# Patient Record
Sex: Male | Born: 1955 | Race: White | Hispanic: No | Marital: Married | Smoking: Never smoker
Health system: Southern US, Community
[De-identification: ages and names within clinical notes are randomized; demographics above are authoritative.]

## PROBLEM LIST (undated history)

## (undated) DIAGNOSIS — I2510 Atherosclerotic heart disease of native coronary artery without angina pectoris: Secondary | ICD-10-CM

## (undated) DIAGNOSIS — E785 Hyperlipidemia, unspecified: Secondary | ICD-10-CM

## (undated) DIAGNOSIS — K219 Gastro-esophageal reflux disease without esophagitis: Secondary | ICD-10-CM

## (undated) DIAGNOSIS — M1990 Unspecified osteoarthritis, unspecified site: Secondary | ICD-10-CM

## (undated) DIAGNOSIS — R0989 Other specified symptoms and signs involving the circulatory and respiratory systems: Secondary | ICD-10-CM

## (undated) DIAGNOSIS — I779 Disorder of arteries and arterioles, unspecified: Secondary | ICD-10-CM

## (undated) DIAGNOSIS — F17220 Nicotine dependence, chewing tobacco, uncomplicated: Secondary | ICD-10-CM

## (undated) DIAGNOSIS — I209 Angina pectoris, unspecified: Secondary | ICD-10-CM

## (undated) DIAGNOSIS — Z87442 Personal history of urinary calculi: Secondary | ICD-10-CM

## (undated) DIAGNOSIS — I10 Essential (primary) hypertension: Secondary | ICD-10-CM

## (undated) DIAGNOSIS — E119 Type 2 diabetes mellitus without complications: Secondary | ICD-10-CM

## (undated) DIAGNOSIS — R519 Headache, unspecified: Secondary | ICD-10-CM

## (undated) HISTORY — DX: Nicotine dependence, chewing tobacco, uncomplicated: F17.220

## (undated) HISTORY — DX: Type 2 diabetes mellitus without complications: E11.9

## (undated) HISTORY — DX: Essential (primary) hypertension: I10

## (undated) HISTORY — DX: Disorder of arteries and arterioles, unspecified: I77.9

## (undated) HISTORY — PX: COLONOSCOPY: SHX174

---

## 2005-12-24 ENCOUNTER — Ambulatory Visit: Payer: Self-pay | Admitting: Family Medicine

## 2006-02-05 ENCOUNTER — Ambulatory Visit (HOSPITAL_COMMUNITY): Admission: RE | Admit: 2006-02-05 | Discharge: 2006-02-05 | Payer: Self-pay | Admitting: Neurosurgery

## 2006-07-09 HISTORY — PX: BACK SURGERY: SHX140

## 2008-03-30 ENCOUNTER — Ambulatory Visit: Payer: Self-pay | Admitting: Gastroenterology

## 2008-03-30 LAB — HM COLONOSCOPY

## 2008-09-10 ENCOUNTER — Ambulatory Visit: Payer: Self-pay | Admitting: Family Medicine

## 2010-02-07 ENCOUNTER — Ambulatory Visit: Payer: Self-pay | Admitting: Family Medicine

## 2010-07-29 ENCOUNTER — Encounter: Payer: Self-pay | Admitting: Neurosurgery

## 2010-09-01 ENCOUNTER — Inpatient Hospital Stay: Payer: Self-pay | Admitting: Internal Medicine

## 2010-09-04 ENCOUNTER — Ambulatory Visit: Payer: Self-pay

## 2011-06-25 ENCOUNTER — Ambulatory Visit: Payer: Self-pay | Admitting: Family Medicine

## 2012-02-22 IMAGING — CR DG KNEE COMPLETE 4+V*R*
1 series · 4 of 4 positions shown · non-contrast
Comparison: none

REASON FOR EXAM: pain
COMMENTS:

[Series 1: ap · 0.17mm/px · 4 of 4 slices shown]
[im 1/4]
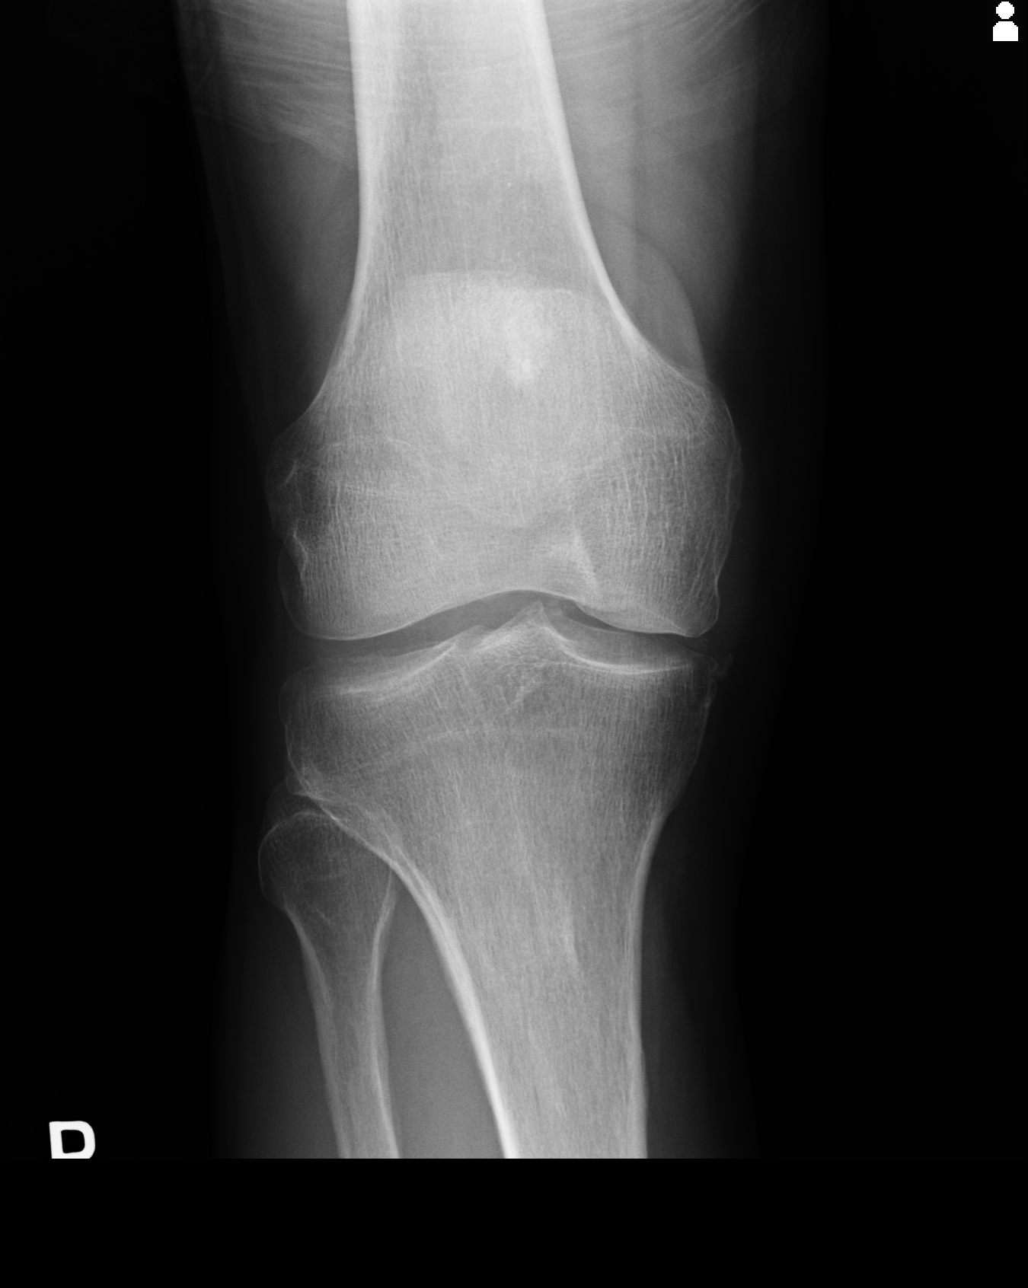
[im 2/4]
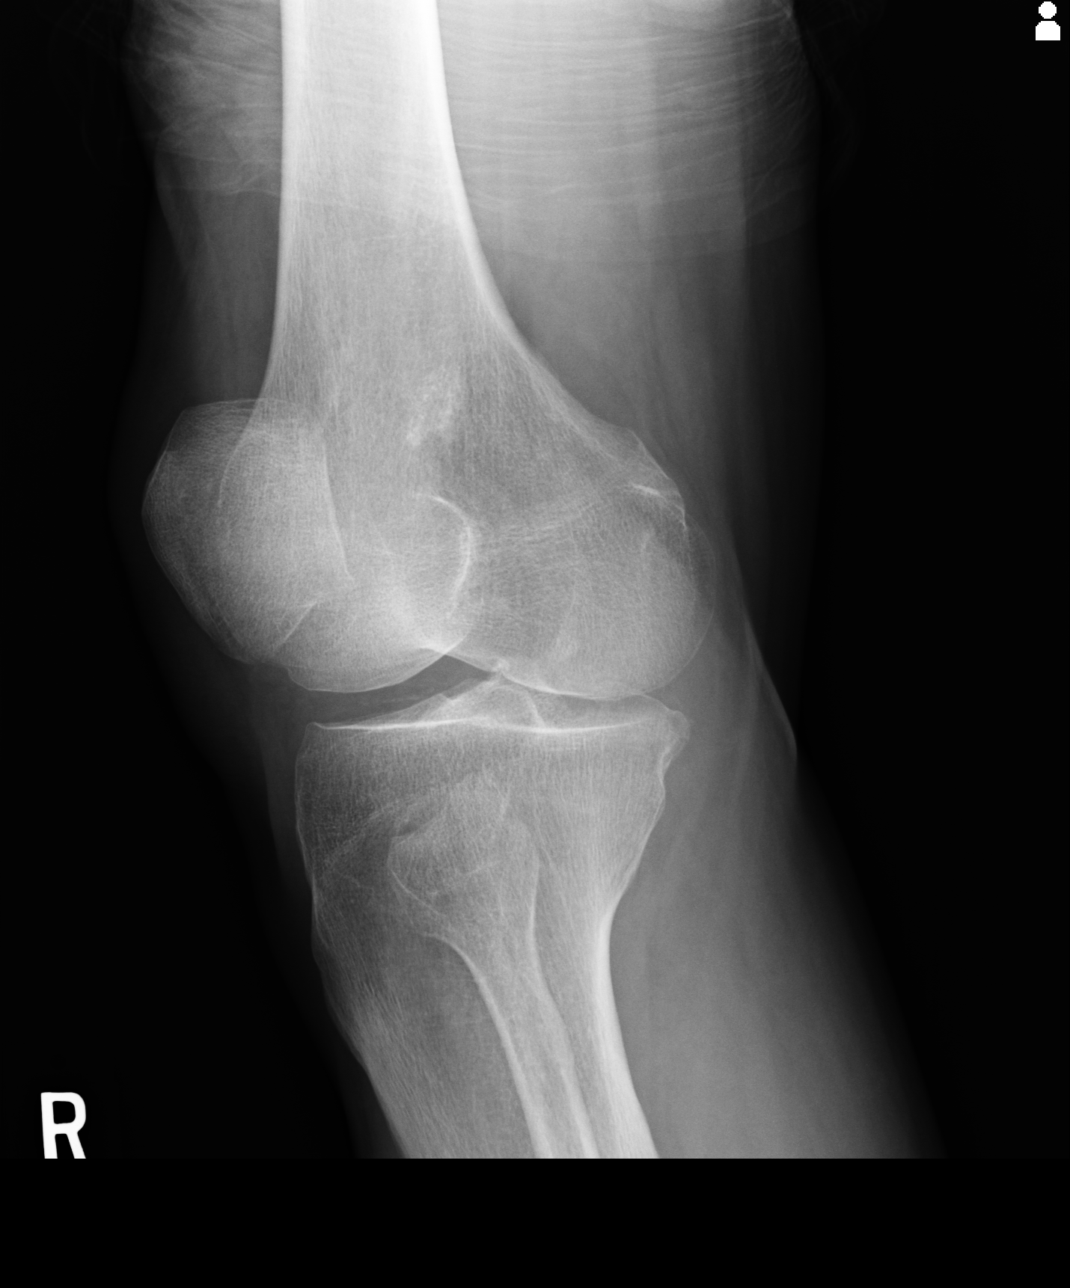
[im 3/4]
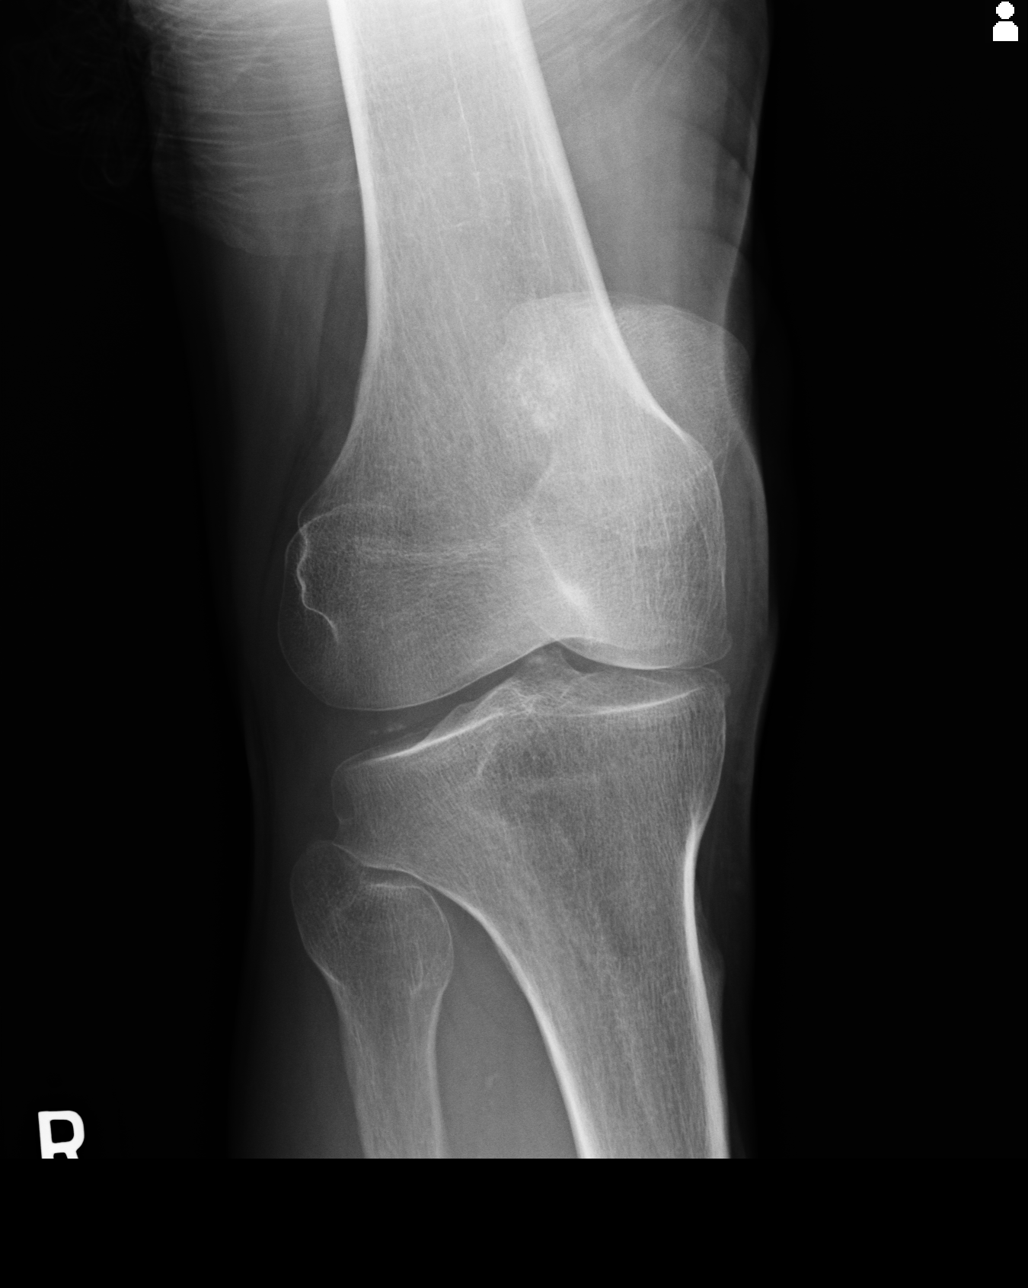
[im 4/4]
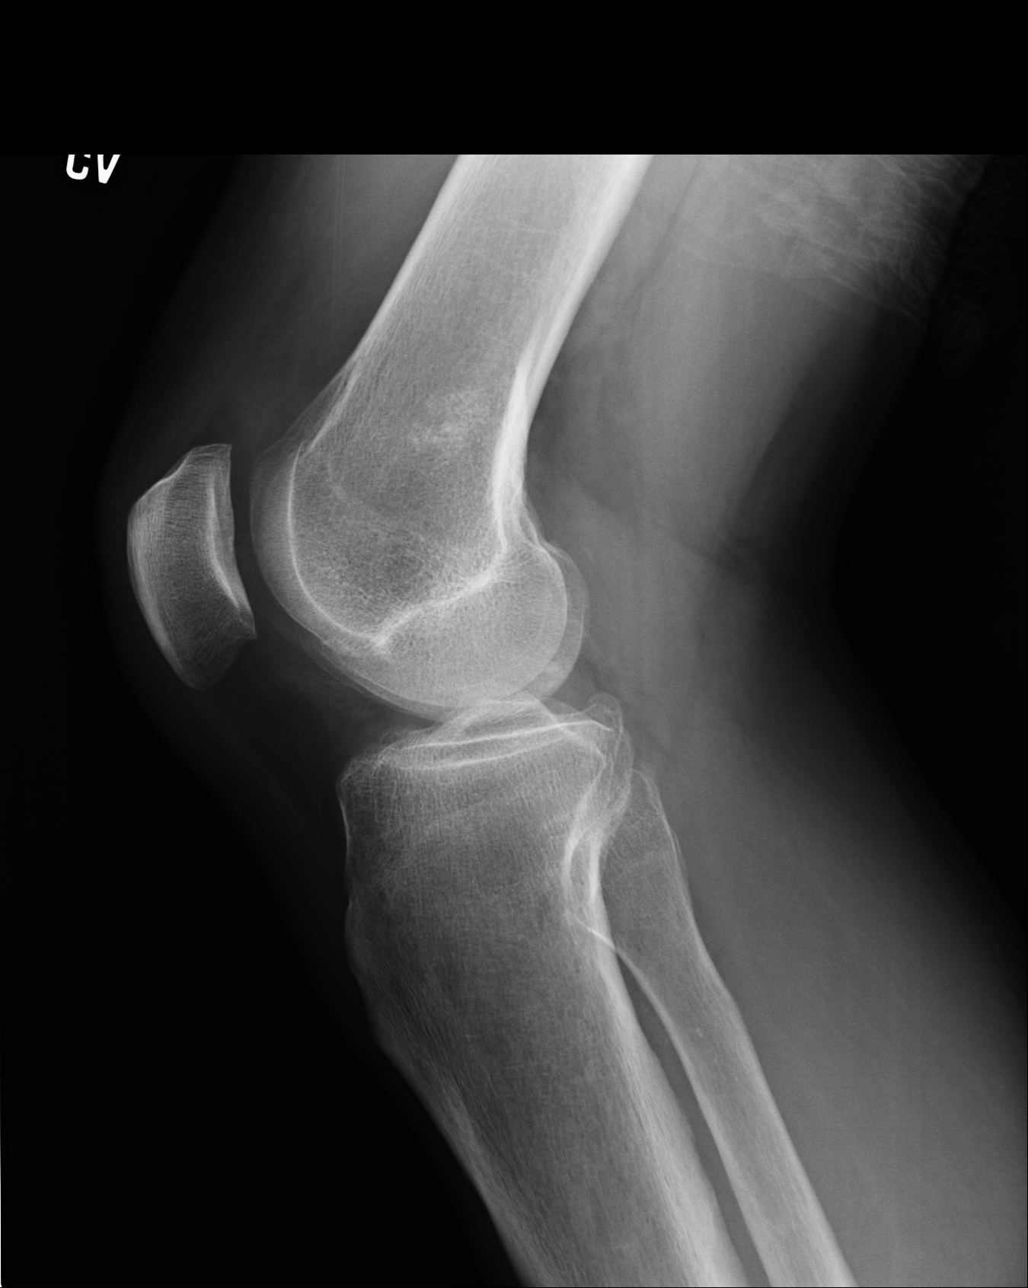

[4 of 4 positions shown; findings below may reference images not displayed]

PROCEDURE:     KDR - KDXR KNEE RT COMP WITH OBLIQUES  - June 25, 2011 [DATE]

RESULT:

No fracture or dislocation is seen. There is calcification in the knee joint
space of the type that can be seen with pseudogout, gout or osteoarthritis.
There is slight spur formation at the knee joint medially. The knee joint
space is well maintained. No lytic or blastic lesions about the knee are
seen. The patella is intact.
IMPRESSION: 1.  No fracture is seen.
2.  There is slight spur formation at the knee medially.
3.  There is observed calcification in the knee joint space of the type seen
with pseudogout, gout or osteoarthritis.

## 2012-07-21 ENCOUNTER — Ambulatory Visit: Payer: Self-pay | Admitting: Orthopedic Surgery

## 2012-10-17 ENCOUNTER — Other Ambulatory Visit: Payer: Self-pay | Admitting: Neurosurgery

## 2012-10-17 DIAGNOSIS — M545 Low back pain, unspecified: Secondary | ICD-10-CM

## 2012-10-24 ENCOUNTER — Ambulatory Visit
Admission: RE | Admit: 2012-10-24 | Discharge: 2012-10-24 | Disposition: A | Discharge status: Home / Self Care | Payer: Self-pay | Source: Ambulatory Visit | Attending: Neurosurgery | Admitting: Neurosurgery

## 2012-10-24 VITALS — BP 147/97 | HR 71 | Ht 70.0 in | Wt 180.0 lb

## 2012-10-24 DIAGNOSIS — M545 Low back pain, unspecified: Secondary | ICD-10-CM

## 2012-10-24 MED ORDER — IOHEXOL 180 MG/ML  SOLN
15.0000 mL | Freq: Once | INTRAMUSCULAR | Status: AC | PRN
  Administered 2012-10-24: 15 mL via INTRATHECAL

## 2012-10-24 MED ORDER — DIAZEPAM 5 MG PO TABS
10.0000 mg | ORAL_TABLET | Freq: Once | ORAL | Status: AC
  Administered 2012-10-24: 10 mg via ORAL

## 2012-12-10 ENCOUNTER — Other Ambulatory Visit: Payer: Self-pay | Admitting: Neurosurgery

## 2012-12-19 ENCOUNTER — Encounter (HOSPITAL_COMMUNITY): Payer: Self-pay | Admitting: Pharmacy Technician

## 2012-12-26 ENCOUNTER — Encounter (HOSPITAL_COMMUNITY)
Admission: RE | Admit: 2012-12-26 | Discharge: 2012-12-26 | Disposition: A | Discharge status: Home / Self Care | Payer: Worker's Compensation | Source: Ambulatory Visit | Attending: Neurosurgery | Admitting: Neurosurgery

## 2012-12-26 ENCOUNTER — Encounter (HOSPITAL_COMMUNITY)
Admission: RE | Admit: 2012-12-26 | Discharge: 2012-12-26 | Disposition: A | Discharge status: Home / Self Care | Payer: Worker's Compensation | Source: Ambulatory Visit | Attending: Anesthesiology | Admitting: Anesthesiology

## 2012-12-26 ENCOUNTER — Encounter (HOSPITAL_COMMUNITY): Payer: Self-pay

## 2012-12-26 HISTORY — DX: Unspecified osteoarthritis, unspecified site: M19.90

## 2012-12-26 HISTORY — DX: Personal history of urinary calculi: Z87.442

## 2012-12-26 HISTORY — DX: Gastro-esophageal reflux disease without esophagitis: K21.9

## 2012-12-26 LAB — CBC
HCT: 45.6 % (ref 39.0–52.0)
Hemoglobin: 16.1 g/dL (ref 13.0–17.0)
MCH: 28.8 pg (ref 26.0–34.0)
MCV: 81.4 fL (ref 78.0–100.0)
RBC: 5.6 MIL/uL (ref 4.22–5.81)

## 2012-12-26 LAB — TYPE AND SCREEN: Antibody Screen: NEGATIVE

## 2012-12-26 NOTE — Pre-Procedure Instructions (Addendum)
SMAYAN HACKBART  12/26/2012   Your procedure is scheduled on:  01/01/13  Report to [facility] at 530 AM.  Call this number if you have problems the morning of surgery: [phone]   Remember:   Do not eat food or drink liquids after midnight.   Take these medicines the morning of surgery with A SIP OF WATER: nexium                   STOP aspirin, naproxen or any other nsaids   Do not wear jewelry, make-up or nail polish.  Do not wear lotions, powders, or perfumes. You may wear deodorant.  Do not shave 48 hours prior to surgery. Men may shave face and neck.  Do not bring valuables to the hospital.  [facility] is not responsible                   for any belongings or valuables.  Contacts, dentures or bridgework may not be worn into surgery.  Leave suitcase in the car. After surgery it may be brought to your room.  For patients admitted to the hospital, checkout time is 11:00 AM the day of  discharge.   Patients discharged the day of surgery will not be allowed to drive  home.  Name and phone number of your driver:  Special Instructions: Shower using CHG 2 nights before surgery and the night before surgery.  If you shower the day of surgery use CHG.  Use special wash - you have one bottle of CHG for all showers.  You should use approximately 1/3 of the bottle for each shower.   Please read over the following fact sheets that you were given: Pain Booklet, Coughing and Deep Breathing, Blood Transfusion Information, MRSA Information and Surgical Site Infection Prevention

## 2012-12-26 NOTE — Progress Notes (Signed)
req'd stress test done at [facility] 2012?  Normal per pt dx "gas"

## 2012-12-31 MED ORDER — DEXAMETHASONE SODIUM PHOSPHATE 10 MG/ML IJ SOLN
10.0000 mg | INTRAMUSCULAR | Status: AC
  Administered 2013-01-01: 10 mg via INTRAVENOUS
  Filled 2012-12-31: qty 1

## 2012-12-31 MED ORDER — CEFAZOLIN SODIUM-DEXTROSE 2-3 GM-% IV SOLR
2.0000 g | INTRAVENOUS | Status: AC
  Administered 2013-01-01: 2 g via INTRAVENOUS
  Filled 2012-12-31: qty 50

## 2013-01-01 ENCOUNTER — Encounter (HOSPITAL_COMMUNITY): Payer: Self-pay | Admitting: Anesthesiology

## 2013-01-01 ENCOUNTER — Ambulatory Visit (HOSPITAL_COMMUNITY): Payer: Worker's Compensation | Admitting: Anesthesiology

## 2013-01-01 ENCOUNTER — Encounter (HOSPITAL_COMMUNITY): Payer: Self-pay | Admitting: *Deleted

## 2013-01-01 ENCOUNTER — Encounter (HOSPITAL_COMMUNITY)
Admission: RE | Disposition: A | Discharge status: Home / Self Care | Payer: Self-pay | Source: Ambulatory Visit | Attending: Neurosurgery

## 2013-01-01 ENCOUNTER — Ambulatory Visit (HOSPITAL_COMMUNITY)
Admission: RE | Admit: 2013-01-01 | Discharge: 2013-01-02 | Disposition: A | Discharge status: Home / Self Care | Payer: Worker's Compensation | Source: Ambulatory Visit | Attending: Neurosurgery | Admitting: Neurosurgery

## 2013-01-01 ENCOUNTER — Ambulatory Visit (HOSPITAL_COMMUNITY): Payer: Worker's Compensation

## 2013-01-01 DIAGNOSIS — M48061 Spinal stenosis, lumbar region without neurogenic claudication: Secondary | ICD-10-CM | POA: Insufficient documentation

## 2013-01-01 DIAGNOSIS — Z01812 Encounter for preprocedural laboratory examination: Secondary | ICD-10-CM | POA: Insufficient documentation

## 2013-01-01 HISTORY — PX: ANTERIOR LAT LUMBAR FUSION: SHX1168

## 2013-01-01 HISTORY — PX: LUMBAR PERCUTANEOUS PEDICLE SCREW 1 LEVEL: SHX5560

## 2013-01-01 SURGERY — ANTERIOR LATERAL LUMBAR FUSION 1 LEVEL
Anesthesia: General | Site: Back | Laterality: Right | Wound class: Clean

## 2013-01-01 MED ORDER — LIDOCAINE HCL (CARDIAC) 20 MG/ML IV SOLN
INTRAVENOUS | Status: DC | PRN
  Administered 2013-01-01: 60 mg via INTRAVENOUS

## 2013-01-01 MED ORDER — SODIUM CHLORIDE 0.9 % IJ SOLN: 3.0000 mL | INTRAMUSCULAR | Status: DC | PRN

## 2013-01-01 MED ORDER — PHENOL 1.4 % MT LIQD: 1.0000 | OROMUCOSAL | Status: DC | PRN

## 2013-01-01 MED ORDER — HYDROCODONE-ACETAMINOPHEN 5-325 MG PO TABS
1.0000 | ORAL_TABLET | ORAL | Status: DC | PRN
  Administered 2013-01-01 – 2013-01-02 (×3): 2 via ORAL
  Filled 2013-01-01 (×3): qty 2

## 2013-01-01 MED ORDER — SODIUM CHLORIDE 0.9 % IR SOLN
Status: DC | PRN
  Administered 2013-01-01: 09:00:00

## 2013-01-01 MED ORDER — ALUM & MAG HYDROXIDE-SIMETH 200-200-20 MG/5ML PO SUSP: 30.0000 mL | Freq: Four times a day (QID) | ORAL | Status: DC | PRN

## 2013-01-01 MED ORDER — SUCCINYLCHOLINE CHLORIDE 20 MG/ML IJ SOLN
INTRAMUSCULAR | Status: DC | PRN
  Administered 2013-01-01: 120 mg via INTRAVENOUS

## 2013-01-01 MED ORDER — 0.9 % SODIUM CHLORIDE (POUR BTL) OPTIME
TOPICAL | Status: DC | PRN
  Administered 2013-01-01: 1000 mL

## 2013-01-01 MED ORDER — SODIUM CHLORIDE 0.9 % IV SOLN
INTRAVENOUS | Status: AC
  Filled 2013-01-01: qty 500

## 2013-01-01 MED ORDER — GLYCOPYRROLATE 0.2 MG/ML IJ SOLN
INTRAMUSCULAR | Status: DC | PRN
  Administered 2013-01-01: .8 mg via INTRAVENOUS

## 2013-01-01 MED ORDER — NEOSTIGMINE METHYLSULFATE 1 MG/ML IJ SOLN
INTRAMUSCULAR | Status: DC | PRN
  Administered 2013-01-01: 5 mg via INTRAVENOUS

## 2013-01-01 MED ORDER — BUPIVACAINE HCL (PF) 0.25 % IJ SOLN
INTRAMUSCULAR | Status: DC | PRN
  Administered 2013-01-01: 17 mL

## 2013-01-01 MED ORDER — PANTOPRAZOLE SODIUM 40 MG IV SOLR
40.0000 mg | Freq: Every day | INTRAVENOUS | Status: DC
  Filled 2013-01-01 (×2): qty 40

## 2013-01-01 MED ORDER — BACITRACIN 50000 UNITS IM SOLR
INTRAMUSCULAR | Status: AC
  Filled 2013-01-01: qty 1

## 2013-01-01 MED ORDER — SODIUM CHLORIDE 0.9 % IV SOLN: 250.0000 mL | INTRAVENOUS | Status: DC

## 2013-01-01 MED ORDER — ACETAMINOPHEN 325 MG PO TABS: 650.0000 mg | ORAL_TABLET | ORAL | Status: DC | PRN

## 2013-01-01 MED ORDER — ACETAMINOPHEN 650 MG RE SUPP: 650.0000 mg | RECTAL | Status: DC | PRN

## 2013-01-01 MED ORDER — LACTATED RINGERS IV SOLN
INTRAVENOUS | Status: DC | PRN
  Administered 2013-01-01 (×2): via INTRAVENOUS

## 2013-01-01 MED ORDER — ARTIFICIAL TEARS OP OINT
TOPICAL_OINTMENT | OPHTHALMIC | Status: DC | PRN
  Administered 2013-01-01: 1 via OPHTHALMIC

## 2013-01-01 MED ORDER — MIDAZOLAM HCL 5 MG/5ML IJ SOLN
INTRAMUSCULAR | Status: DC | PRN
  Administered 2013-01-01: 2 mg via INTRAVENOUS

## 2013-01-01 MED ORDER — MENTHOL 3 MG MT LOZG: 1.0000 | LOZENGE | OROMUCOSAL | Status: DC | PRN

## 2013-01-01 MED ORDER — HYDROMORPHONE HCL PF 1 MG/ML IJ SOLN: 1.0000 mg | INTRAMUSCULAR | Status: DC | PRN

## 2013-01-01 MED ORDER — CEFAZOLIN SODIUM-DEXTROSE 2-3 GM-% IV SOLR
2.0000 g | Freq: Three times a day (TID) | INTRAVENOUS | Status: DC
  Administered 2013-01-01 – 2013-01-02 (×3): 2 g via INTRAVENOUS
  Filled 2013-01-01 (×4): qty 50

## 2013-01-01 MED ORDER — SIMVASTATIN 5 MG PO TABS
5.0000 mg | ORAL_TABLET | Freq: Every day | ORAL | Status: DC
  Administered 2013-01-01: 5 mg via ORAL
  Filled 2013-01-01 (×2): qty 1

## 2013-01-01 MED ORDER — ACETAMINOPHEN 10 MG/ML IV SOLN: 1000.0000 mg | Freq: Once | INTRAVENOUS | Status: DC | PRN

## 2013-01-01 MED ORDER — KCL IN DEXTROSE-NACL 20-5-0.45 MEQ/L-%-% IV SOLN
80.0000 mL/h | INTRAVENOUS | Status: DC
  Filled 2013-01-01 (×4): qty 1000

## 2013-01-01 MED ORDER — ROCURONIUM BROMIDE 100 MG/10ML IV SOLN
INTRAVENOUS | Status: DC | PRN
  Administered 2013-01-01: 20 mg via INTRAVENOUS
  Administered 2013-01-01: 30 mg via INTRAVENOUS

## 2013-01-01 MED ORDER — LACTATED RINGERS IV SOLN
INTRAVENOUS | Status: DC | PRN
  Administered 2013-01-01: 08:00:00 via INTRAVENOUS

## 2013-01-01 MED ORDER — THROMBIN 5000 UNITS EX SOLR
CUTANEOUS | Status: DC | PRN
  Administered 2013-01-01 (×2): 5000 [IU] via TOPICAL

## 2013-01-01 MED ORDER — ONDANSETRON HCL 4 MG/2ML IJ SOLN
INTRAMUSCULAR | Status: DC | PRN
  Administered 2013-01-01: 4 mg via INTRAVENOUS

## 2013-01-01 MED ORDER — ONDANSETRON HCL 4 MG/2ML IJ SOLN
4.0000 mg | INTRAMUSCULAR | Status: DC | PRN
  Filled 2013-01-01: qty 2

## 2013-01-01 MED ORDER — EPHEDRINE SULFATE 50 MG/ML IJ SOLN
INTRAMUSCULAR | Status: DC | PRN
  Administered 2013-01-01 (×2): 10 mg via INTRAVENOUS
  Administered 2013-01-01 (×2): 5 mg via INTRAVENOUS
  Administered 2013-01-01: 10 mg via INTRAVENOUS
  Administered 2013-01-01: 5 mg via INTRAVENOUS
  Administered 2013-01-01: 10 mg via INTRAVENOUS
  Administered 2013-01-01: 5 mg via INTRAVENOUS

## 2013-01-01 MED ORDER — PROPOFOL 10 MG/ML IV BOLUS
INTRAVENOUS | Status: DC | PRN
  Administered 2013-01-01: 20 mg via INTRAVENOUS
  Administered 2013-01-01: 190 mg via INTRAVENOUS
  Administered 2013-01-01: 100 mg via INTRAVENOUS
  Administered 2013-01-01: 50 mg via INTRAVENOUS
  Administered 2013-01-01: 30 mg via INTRAVENOUS

## 2013-01-01 MED ORDER — SODIUM CHLORIDE 0.9 % IJ SOLN
3.0000 mL | Freq: Two times a day (BID) | INTRAMUSCULAR | Status: DC
  Administered 2013-01-01: 3 mL via INTRAVENOUS

## 2013-01-01 MED ORDER — DIAZEPAM 5 MG PO TABS
5.0000 mg | ORAL_TABLET | Freq: Four times a day (QID) | ORAL | Status: DC | PRN
  Administered 2013-01-01 (×2): 5 mg via ORAL
  Filled 2013-01-01 (×2): qty 1

## 2013-01-01 MED ORDER — PHENYLEPHRINE HCL 10 MG/ML IJ SOLN
INTRAMUSCULAR | Status: DC | PRN
  Administered 2013-01-01 (×4): 40 ug via INTRAVENOUS
  Administered 2013-01-01 (×2): 80 ug via INTRAVENOUS
  Administered 2013-01-01: 40 ug via INTRAVENOUS

## 2013-01-01 MED ORDER — SODIUM CHLORIDE 0.9 % IV SOLN
INTRAVENOUS | Status: AC
Start: 2013-01-01 — End: 2013-01-01
  Filled 2013-01-01: qty 500

## 2013-01-01 MED ORDER — ONDANSETRON HCL 4 MG/2ML IJ SOLN: 4.0000 mg | Freq: Once | INTRAMUSCULAR | Status: DC | PRN

## 2013-01-01 MED ORDER — HEMOSTATIC AGENTS (NO CHARGE) OPTIME
TOPICAL | Status: DC | PRN
  Administered 2013-01-01: 1 via TOPICAL

## 2013-01-01 MED ORDER — FENTANYL CITRATE 0.05 MG/ML IJ SOLN
INTRAMUSCULAR | Status: DC | PRN
  Administered 2013-01-01 (×2): 50 ug via INTRAVENOUS
  Administered 2013-01-01: 100 ug via INTRAVENOUS
  Administered 2013-01-01 (×2): 50 ug via INTRAVENOUS

## 2013-01-01 MED ORDER — HYDROMORPHONE HCL PF 1 MG/ML IJ SOLN
0.2500 mg | INTRAMUSCULAR | Status: DC | PRN
  Administered 2013-01-01 (×4): 0.5 mg via INTRAVENOUS

## 2013-01-01 SURGICAL SUPPLY — 69 items
ADH SKN CLS APL DERMABOND .7 (GAUZE/BANDAGES/DRESSINGS) ×2
BAG DECANTER FOR FLEXI CONT (MISCELLANEOUS) ×6 IMPLANT
BAYONET ANNULOTOMY KNIFE 193MM ×3 IMPLANT
BENZOIN TINCTURE PRP APPL 2/3 (GAUZE/BANDAGES/DRESSINGS) ×9 IMPLANT
BLADE SURG ROTATE 9660 (MISCELLANEOUS) IMPLANT
BONE EQUIVA 10CC (Bone Implant) ×3 IMPLANT
BRUSH SCRUB EZ PLAIN DRY (MISCELLANEOUS) ×3 IMPLANT
CLOTH BEACON ORANGE TIMEOUT ST (SAFETY) ×6 IMPLANT
CONT SPEC 4OZ CLIKSEAL STRL BL (MISCELLANEOUS) IMPLANT
COVER BACK TABLE 24X17X13 BIG (DRAPES) IMPLANT
COVER TABLE BACK 60X90 (DRAPES) ×3 IMPLANT
DERMABOND ADVANCED (GAUZE/BANDAGES/DRESSINGS) ×2
DERMABOND ADVANCED .7 DNX12 (GAUZE/BANDAGES/DRESSINGS) ×4 IMPLANT
DISPOSABLE XL PEDICLE SCREW PROBE ×3 IMPLANT
DRAPE C-ARM 42X72 X-RAY (DRAPES) ×6 IMPLANT
DRAPE C-ARMOR (DRAPES) ×6 IMPLANT
DRAPE LAPAROTOMY 100X72X124 (DRAPES) ×6 IMPLANT
DRAPE SURG 17X23 STRL (DRAPES) ×12 IMPLANT
DRESSING TELFA 8X3 (GAUZE/BANDAGES/DRESSINGS) ×6 IMPLANT
DURAPREP 26ML APPLICATOR (WOUND CARE) ×3 IMPLANT
ELECT BLADE 4.0 EZ CLEAN MEGAD (MISCELLANEOUS) ×3
ELECT REM PT RETURN 9FT ADLT (ELECTROSURGICAL) ×6
ELECTRODE BLDE 4.0 EZ CLN MEGD (MISCELLANEOUS) ×2 IMPLANT
ELECTRODE REM PT RTRN 9FT ADLT (ELECTROSURGICAL) ×4 IMPLANT
EVACUATOR 1/8 PVC DRAIN (DRAIN) IMPLANT
FORCEPS BPLR BAYO 10IN 1.0TIP (INSTRUMENTS) ×6 IMPLANT
GAUZE SPONGE 4X4 16PLY XRAY LF (GAUZE/BANDAGES/DRESSINGS) IMPLANT
GLOVE BIO SURGEON STRL SZ8 (GLOVE) IMPLANT
GLOVE BIOGEL PI IND STRL 8 (GLOVE) ×6 IMPLANT
GLOVE BIOGEL PI INDICATOR 8 (GLOVE) ×3
GLOVE ECLIPSE 7.5 STRL STRAW (GLOVE) ×18 IMPLANT
GLOVE EXAM NITRILE LRG STRL (GLOVE) IMPLANT
GLOVE EXAM NITRILE XL STR (GLOVE) IMPLANT
GLOVE EXAM NITRILE XS STR PU (GLOVE) IMPLANT
GOWN BRE IMP SLV AUR LG STRL (GOWN DISPOSABLE) ×9 IMPLANT
GOWN BRE IMP SLV AUR XL STRL (GOWN DISPOSABLE) ×6 IMPLANT
GOWN STRL REIN 2XL LVL4 (GOWN DISPOSABLE) ×3 IMPLANT
K-WIRE  1.6X 450L (WIRE) ×3
K-WIRE 1.6X 450L (WIRE) ×6
K-WIRE NITHNOL TROCAR TIP (WIRE) ×6 IMPLANT
KIT BASIN OR (CUSTOM PROCEDURE TRAY) ×6 IMPLANT
KIT DISP MARS 3V (KITS) ×3 IMPLANT
KIT PEDICLE ACCESS (KITS) ×3 IMPLANT
KIT ROOM TURNOVER OR (KITS) ×6 IMPLANT
KWIRE 1.6X 450L (WIRE) ×6 IMPLANT
NEEDLE HYPO 22GX1.5 SAFETY (NEEDLE) ×6 IMPLANT
NEEDLE TARGETING (NEEDLE) ×6 IMPLANT
NS IRRIG 1000ML POUR BTL (IV SOLUTION) ×6 IMPLANT
PACK LAMINECTOMY NEURO (CUSTOM PROCEDURE TRAY) ×6 IMPLANT
ROD PERC 55MM LUMBAR (Rod) ×3 IMPLANT
SCREW PATHFINDER MIS 5.5X45 (Screw) ×6 IMPLANT
SHIM STAINLESS (MISCELLANEOUS) ×6 IMPLANT
SPACER CALIBER L 22X45 9-13MM (Spacer) ×3 IMPLANT
SPONGE GAUZE 4X4 12PLY (GAUZE/BANDAGES/DRESSINGS) ×3 IMPLANT
SPONGE LAP 4X18 X RAY DECT (DISPOSABLE) IMPLANT
SPONGE SURGIFOAM ABS GEL SZ50 (HEMOSTASIS) IMPLANT
SPRING CLIP ×3 IMPLANT
STRIP CLOSURE SKIN 1/2X4 (GAUZE/BANDAGES/DRESSINGS) ×6 IMPLANT
SUT VIC AB 2-0 OS6 18 (SUTURE) ×30 IMPLANT
SUT VIC AB 3-0 CP2 18 (SUTURE) ×12 IMPLANT
SYR 20ML ECCENTRIC (SYRINGE) ×6 IMPLANT
SYSTEM MIS ILLUMINATION (SYSTAGENIX WOUND MANAGEMENT) ×3 IMPLANT
TAPE CLOTH 3X10 TAN LF (GAUZE/BANDAGES/DRESSINGS) ×6 IMPLANT
TOP CLSR SEQUOIA (Orthopedic Implant) ×6 IMPLANT
TOWEL OR 17X24 6PK STRL BLUE (TOWEL DISPOSABLE) ×6 IMPLANT
TOWEL OR 17X26 10 PK STRL BLUE (TOWEL DISPOSABLE) ×6 IMPLANT
TRAP SPECIMEN MUCOUS 40CC (MISCELLANEOUS) IMPLANT
TRAY FOLEY CATH 14FRSI W/METER (CATHETERS) ×3 IMPLANT
WATER STERILE IRR 1000ML POUR (IV SOLUTION) ×6 IMPLANT

## 2013-01-01 NOTE — Op Note (Signed)
Preop diagnosis: Retrolisthesis and spinal stenosis L3-4 Postop diagnosis: Same Procedure: L3-4 anterolateral fusion via right retroperitoneal approach with peek interbody spacer, expandable Left L3-4 percutaneous pedicle screw fixation with Zimmer Pathfinder system Surgeon: Insurance risk surveyor: Newell Coral  After being placed in the right side up lateral cubitus position the patient's position was aligned with AP and lateral fluoroscopy. He was secured to the bed in standard fashion. The right flank was then prepped and draped in the usual sterile fashion. Linear incision was made above the disc space at L3-4 and carried down to the muscle but not through it. A second incision was then made posterior to this and using finger dissection we entered the retroperitoneum without difficulty. We swept soft tissue away and then turned our finger upward to allow access into the retroperitoneum for the more flank incision. We passed our first dilator down to the disc space and confirmed its location by fluoroscopy. We then did electrical testing which showed no abnormal readings and secured the dilator to the disc with a guidewire. We then did sequential dilation through the sella as muscle using EMG monitoring as we went to make sure he got no abnormal readings which we did not encounter. We then placed a retractor and secured to the table with the arm and locked in place. We opened it slightly after removing the dilators and electrical testing which showed no abnormal readings. We confirmed good position AP lateral fluoroscopy and then secured the retractor to the disc space with the shim. We then spent a few minutes coagulated any residual muscle fibers we get good view of the disc space we then incised the disc with a 15 blade. We thoroughly cleaned out with a variety of instruments and release the annulus on the opposite side with the small Cobb elevator. We then did some trialing after we had thoroughly cleaned out  the disc space and felt that a 9 mm x 22 mm x 45 mm expandable cage was a good choice. We irrigated the wound copiously and filled the cage with morselized allograft. We then impacted the cage and without difficulty. We then expanded up to an 11 mm size which looked to be in excellent fit. We irrigated once more then removed the retractor and final fluoroscopy AP lateral direction looked excellent. We irrigated once more and closed in multiple layers of Vicryl with Dermabond on the skin. We placed a sterile dressing and placed is a patient of the prone position. We prepped and draped his back in usual sterile fashion. We used AP fluoroscopy to identify entry points for the lateral aspect of the pedicles at L3 and L4. We made small incision in each level and pass Jamshidi needles through the pedicles from lateral to medial direction. We then did sequential dilation then tapped with a 5.0 mm tap and placed 5.5 x 45 mm screws at each level with the Towers attached. We then removed the working channel. We measured and chosen 55 mm rod which we passed a subfascial without difficulty. We then secured the rod to the top of the screws and did tightening and final tightening with torque and counter torque. We then removed the [facility] and final fluoroscopy looked excellent. We irrigated the wounds and closed in multiple layers of Vicryl with Dermabond on the skin. Shortness was then applied the patient was extubated and taken to recovery room in stable condition.

## 2013-01-01 NOTE — Preoperative (Signed)
Beta Blockers   Reason not to administer Beta Blockers:Not Applicable 

## 2013-01-01 NOTE — Anesthesia Procedure Notes (Signed)
Procedure Name: Intubation Date/Time: 01/01/2013 7:42 AM Performed by: Sherie Don Pre-anesthesia Checklist: Patient identified, Emergency Drugs available, Suction available, Patient being monitored and Timeout performed Patient Re-evaluated:Patient Re-evaluated prior to inductionOxygen Delivery Method: Circle system utilized Preoxygenation: Pre-oxygenation with 100% oxygen Intubation Type: IV induction Ventilation: Mask ventilation without difficulty Laryngoscope Size: Miller and 2 Grade View: Grade II Tube type: Oral Tube size: 7.5 mm Placement Confirmation: ETT inserted through vocal cords under direct vision and breath sounds checked- equal and bilateral Secured at: 21 cm Tube secured with: Tape Dental Injury: Teeth and Oropharynx as per pre-operative assessment

## 2013-01-01 NOTE — H&P (Signed)
  Shawn Sherman is an 57 y.o. male.   Chief Complaint: Back and lower extremity pain HPI: The patient is a 63 old gentleman with back and bilateral lower extremity pain much worse on the left on the right. He has been tried on aggressive conservative therapy which gave him no improvement. He underwent imaging studies which showed stenosis and retrolisthesis at L3-4 which was mobile and flexion and extension. After discussing the options the patient requested surgery and now comes for an L3 for extreme lateral interbody fusion with pedicle screw fixation. I've had a long discussion with him regarding the risks and benefits of surgical intervention. The risks discussed include but are not limited to bleeding infection weakness numbness paralysis trouble with instrumentation nonunion coma and death. We have discussed alternative methods of therapy offered risks and benefits of nonintervention. He has had the opportunity to ask numerous questions and appears to understand. With this information in hand he has requested we proceed with surgery.  Past Medical History  Diagnosis Date  . GERD (gastroesophageal reflux disease)   . History of kidney stones   . Arthritis     Past Surgical History  Procedure Laterality Date  . Back surgery  08    History reviewed. No pertinent family history. Social History:  reports that he has never smoked. He has never used smokeless tobacco. He reports that he does not drink alcohol or use illicit drugs.  Allergies: No Known Allergies  Medications Prior to Admission  Medication Sig Dispense Refill  . aspirin EC 81 MG tablet Take 81 mg by mouth daily.      Marland Kitchen esomeprazole (NEXIUM) 40 MG capsule Take 40 mg by mouth daily before breakfast.      . naproxen sodium (ANAPROX) 220 MG tablet Take 440 mg by mouth daily.      . pravastatin (PRAVACHOL) 40 MG tablet Take 40 mg by mouth daily.        No results found for this or any previous visit (from the past 48  hour(s)). No results found.  Review of systems not obtained due to patient factors.  Blood pressure 161/92, pulse 79, temperature 98.4 F (36.9 C), temperature source Oral, resp. rate 18, SpO2 97.00%.  The patient is awake or and oriented. His no facial asymmetry. His gait is mildly antalgic. Reflexes are decreased but equal. He does have normal strength and sensation. Assessment/Plan Impression is that of stenosis and retrolisthesis at L3-4. The plan is for an L3 for extreme lateral interbody fusion with instrumentation.  Reinaldo Meeker, MD 01/01/2013, 7:27 AM

## 2013-01-01 NOTE — Anesthesia Postprocedure Evaluation (Signed)
  Anesthesia Post-op Note  Patient: Shawn Sherman  Procedure(s) Performed: Procedure(s) with comments: lumbar three-four extreme lateral interbody fusion (Right) - Lumbar three-four extreme lateral interbody fusion   lumbar three-four percutaneous pedicle screws  (N/A) - lumbar three-four percutaneous pedicle screws  Patient Location: PACU  Anesthesia Type:General  Level of Consciousness: awake, alert  and oriented  Airway and Oxygen Therapy: Patient Spontanous Breathing and Patient connected to nasal cannula oxygen  Post-op Pain: mild  Post-op Assessment: Post-op Vital signs reviewed, Patient's Cardiovascular Status Stable, Respiratory Function Stable, Patent Airway and Pain level controlled  Post-op Vital Signs: stable  Complications: No apparent anesthesia complications

## 2013-01-01 NOTE — Transfer of Care (Signed)
Immediate Anesthesia Transfer of Care Note  Patient: Shawn Sherman  Procedure(s) Performed: Procedure(s) with comments: lumbar three-four extreme lateral interbody fusion (Right) - Lumbar three-four extreme lateral interbody fusion   lumbar three-four percutaneous pedicle screws  (N/A) - lumbar three-four percutaneous pedicle screws  Patient Location: PACU  Anesthesia Type:General  Level of Consciousness: awake, oriented and patient cooperative  Airway & Oxygen Therapy: Patient Spontanous Breathing and Patient connected to nasal cannula oxygen  Post-op Assessment: Report given to PACU RN and Post -op Vital signs reviewed and stable  Post vital signs: Reviewed and stable  Complications: No apparent anesthesia complications

## 2013-01-01 NOTE — Anesthesia Preprocedure Evaluation (Addendum)
Anesthesia Evaluation  Patient identified by MRN, date of birth, ID band Patient awake    Reviewed: Allergy & Precautions, H&P , NPO status , Patient's Chart, lab work & pertinent test results, reviewed documented beta blocker date and time   Airway Mallampati: II TM Distance: >3 FB Neck ROM: Full    Dental  (+) Dental Advisory Given and Teeth Intact   Pulmonary  breath sounds clear to auscultation        Cardiovascular Rhythm:Regular Rate:Normal     Neuro/Psych    GI/Hepatic GERD-  Controlled,  Endo/Other    Renal/GU      Musculoskeletal   Abdominal   Peds  Hematology   Anesthesia Other Findings   Reproductive/Obstetrics                         Anesthesia Physical Anesthesia Plan  ASA: II  Anesthesia Plan: General   Post-op Pain Management:    Induction: Intravenous  Airway Management Planned: Oral ETT  Additional Equipment:   Intra-op Plan:   Post-operative Plan: Extubation in OR  Informed Consent: I have reviewed the patients History and Physical, chart, labs and discussed the procedure including the risks, benefits and alternatives for the proposed anesthesia with the patient or authorized representative who has indicated his/her understanding and acceptance.   Dental advisory given  Plan Discussed with: Anesthesiologist and CRNA  Anesthesia Plan Comments:         Anesthesia Quick Evaluation

## 2013-01-01 NOTE — OR Nursing (Signed)
Procedure one ended at 0935 stim needles removed by stim representative, patient reposition prone ; position checked approved by Dr Gerlene Fee second procedure started at 1005

## 2013-01-02 MED ORDER — HYDROCODONE-ACETAMINOPHEN 5-325 MG PO TABS: 1.0000 | ORAL_TABLET | ORAL | Status: DC | PRN

## 2013-01-02 NOTE — Progress Notes (Signed)
Pt doing well. Pt given D/C instructions with Rx, verbal understanding given. Pt D/C'd home via wheelchair per MD order. Rema Fendt, RN

## 2013-01-02 NOTE — Discharge Summary (Signed)
  Physician Discharge Summary  Patient ID: Shawn Sherman MRN: [id number] DOB/AGE: 03-23-1956 57 y.o.  Admit date: 01/01/2013 Discharge date: 01/02/2013  Admission Diagnoses:  Discharge Diagnoses:  Active Problems:   * No active hospital problems. *   Discharged Condition: good  Hospital Course: Surgery Thursday with 1 level xlif. Did well. No pain post op. Ambulated well. Wound healing well. Home pod 1, specific instructions given.  Consults: None  Significant Diagnostic Studies: none  Treatments: surgery: L 34 xlif with perc screws  Discharge Exam: Blood pressure 171/78, pulse 62, temperature 97.9 F (36.6 C), temperature source Oral, resp. rate 18, SpO2 97.00%. Incision/Wound:clean and dry; no new neuro issues  Disposition:   Discharge Orders   Future Orders Complete By Expires     Call MD for:  difficulty breathing, headache or visual disturbances  As directed     Call MD for:  hives  As directed     Call MD for:  persistant nausea and vomiting  As directed     Call MD for:  redness, tenderness, or signs of infection (pain, swelling, redness, odor or green/yellow discharge around incision site)  As directed     Call MD for:  severe uncontrolled pain  As directed     Call MD for:  temperature >100.4  As directed     Diet general  As directed     Discharge instructions  As directed     Comments:      Mostly bedrest. Get up 9 or 10 times each day and walk for 15-20 minutes each time. Very little sitting the first week. No riding in the car until your first post op appointment. If you had neck surgery...may shower from the chest down. If you had low back surgery....you may shower with a saran wrap covering over the incision. Take your pain medicine as needed...and other medicines that you are instructed to take. Call for an appointment...[phone].        Medication List    STOP taking these medications       aspirin EC 81 MG tablet     naproxen sodium 220 MG tablet   Commonly known as:  ANAPROX      TAKE these medications       esomeprazole 40 MG capsule  Commonly known as:  NEXIUM  Take 40 mg by mouth daily before breakfast.     HYDROcodone-acetaminophen 5-325 MG per tablet  Commonly known as:  NORCO/VICODIN  Take 1-2 tablets by mouth every 4 (four) hours as needed.     pravastatin 40 MG tablet  Commonly known as:  PRAVACHOL  Take 40 mg by mouth daily.         At home rest most of the time. Get up 9 or 10 times each day and take a 15 or 20 minute walk. No riding in the car and to your first postoperative appointment. If you have neck surgery you may shower from the chest down starting on the third postoperative day. If you had back surgery he may start showering on the third postoperative day with saran wrap wrapped around your incisional area 3 times. After the shower remove the saran wrap. Take pain medicine as needed and other medications as instructed. Call my office for an appointment.  SignedReinaldo Meeker, MD 01/02/2013, 10:12 AM

## 2013-01-05 ENCOUNTER — Encounter (HOSPITAL_COMMUNITY): Payer: Self-pay | Admitting: Neurosurgery

## 2013-10-02 ENCOUNTER — Encounter: Payer: Self-pay | Admitting: Family Medicine

## 2013-10-02 LAB — HM HEPATITIS C SCREENING LAB: HM Hepatitis Screen: NEGATIVE

## 2013-10-05 ENCOUNTER — Ambulatory Visit: Payer: Self-pay | Admitting: Family Medicine

## 2013-10-19 ENCOUNTER — Ambulatory Visit: Payer: Self-pay | Admitting: Urology

## 2013-10-29 ENCOUNTER — Ambulatory Visit: Payer: Self-pay | Admitting: Urology

## 2014-10-30 NOTE — H&P (Signed)
PATIENT NAME:  Shawn Sherman, Shawn Sherman MR#:  [id number] DATE OF BIRTH:  1955-10-22  DATE OF ADMISSION:  10/19/2013  CHIEF COMPLAINT: Left flank pain.   HISTORY OF PRESENT ILLNESS: Shawn Sherman is a 59 year old male who presented to the office with left flank pain. He had an IVP performed on April 7th indicating a 7 mm left proximal stone with hydronephrosis. He was also noted to have right upper pole renal stone. He comes in now for left ureteroscopic ureterolithotomy with holmium laser lithotripsy.   ALLERGIES: No drug allergies.   CURRENT MEDICATIONS:  Nucynta as needed for pain. Pravastatin.   PAST SURGICAL HISTORY:  Lumbar spine reconstructive surgery.   SOCIAL HISTORY:   He denies cigarette smoking. He chews tobacco. He consumes 1-4 alcoholic beverages per week.   FAMILY HISTORY: Remarkable for siblings with hypertension hypercholesterolemia, and hypothyroidism.   PAST AND CURRENT MEDICAL CONDITIONS:   1.  Hyperlipidemia.  2.  GERD.  3. Allergic rhinitis.   REVIEW OF SYSTEMS: The patient denied chest pain, shortness of breath, diabetes, or stroke.    PHYSICAL EXAMINATION:  GENERAL: Well-nourished white male in no distress.  HEENT: Sclerae were clear. Pupils are equally round and react to light in accommodation. Extraocular movements are intact.  NECK: Supple. No palpable cervical adenopathy.  LUNGS: Clear to auscultation.  CARDIOVASCULAR: Regular rhythm and rate without audible murmurs or gallops.  ABDOMEN: Soft, nontender abdomen.  GENITOURINARY AND RECTAL:  Deferred.  NEUROMUSCULAR: Grossly intact, alert, and oriented x 3.   IMPRESSION: Left ureterolithiasis with hydronephrosis.   PLAN: Left ureteroscopic ureterolithotomy with holmium laser lithotripsy.    ____________________________ Shawn ConnersMichael R. Evelene CroonWolff, MD mrw:tc D: 10/15/2013 15:31:59 ET T: 10/15/2013 16:09:58 ET JOB#: [id number]  cc: Shawn ConnersMichael R. Evelene CroonWolff, MD, <Dictator> Shawn ApeMICHAEL R Geno Sydnor MD ELECTRONICALLY SIGNED 10/19/2013 9:08

## 2014-10-30 NOTE — Op Note (Signed)
PATIENT NAME:  Shawn Sherman, Shawn Sherman MR#:  [id number] DATE OF BIRTH:  11-Dec-1955  DATE OF PROCEDURE:  10/19/2013  PREOPERATIVE DIAGNOSIS: Left ureterolithiasis.   POSTOPERATIVE DIAGNOSIS: Left ureterolithiasis.   PROCEDURES:  1. Left ureteroscopic ureterolithotomy with holmium laser lithotripsy.  2. Left double pigtail stent placement.  3. Fluoroscopy.   SURGEON: Suszanne ConnersMichael R. Evelene CroonWolff, M.D.   ANESTHETIST: Darleene CleaverVan Staveren and Evelene CroonWolff.   ANESTHETIC METHOD: General per Darleene CleaverVan Staveren and local per Evelene CroonWolff.   INDICATIONS: See the dictated history and physical. After informed consent, the patient requests the above procedures.   OPERATIVE SUMMARY: After adequate general anesthesia had been obtained, the patient was placed into the dorsal lithotomy position, and the perineum was prepped and draped in the usual fashion. Fluoroscopy confirmed the presence of a 10 mm left proximal ureteral stone. At this point, the 21-French cystoscope was coupled with the camera and then visually advanced into the bladder. The bladder was thoroughly inspected. No lesions were identified. A 0.035 Glidewire was then advanced up the left ureter under fluoroscopic guidance beyond the stone. The intramural ureter was then dilated to 7 mm with a balloon dilating catheter. The dilating catheter was then deflated and removed, taking care to leave the guidewire in position. The cystoscope was then removed. A ureteral access sheath was then advanced over the guidewire and positioned in the ureter. The flexible ureteroscope was then coupled with the camera and then visually advanced up to the stone. A 300 micron holmium laser fiber was then introduced through the scope, and the stone was fully disintegrated. At this point, the ureteroscope and access sheath were removed, taking care to leave the guidewire in position. The cystoscope was then backloaded over the guidewire. A 6 x 26 cm double pigtail stent with suture attached distally was advanced over  the guidewire and positioned in the ureter under fluoroscopic guidance. The guidewire was then removed, taking care to leave the stent in position. The bladder was then drained and cystoscope was removed. Viscous Xylocaine 10 mL was instilled within the urethra and the bladder. A B and O suppository was placed. The procedure was then terminated, and the patient was transferred to the recovery room in stable condition.   ____________________________ Suszanne ConnersMichael R. Evelene CroonWolff, MD mrw:gb D: 10/19/2013 19:57:36 ET T: 10/19/2013 22:24:15 ET JOB#: [id number]  cc: Suszanne ConnersMichael R. Evelene CroonWolff, MD, <Dictator> Orson ApeMICHAEL R Brandell Maready MD ELECTRONICALLY SIGNED 10/20/2013 9:11

## 2014-11-18 ENCOUNTER — Encounter: Payer: Self-pay | Admitting: Family Medicine

## 2014-11-18 ENCOUNTER — Other Ambulatory Visit: Payer: Self-pay | Admitting: Family Medicine

## 2014-11-18 DIAGNOSIS — R1031 Right lower quadrant pain: Secondary | ICD-10-CM

## 2014-11-19 ENCOUNTER — Ambulatory Visit
Admission: RE | Admit: 2014-11-19 | Discharge: 2014-11-19 | Disposition: A | Discharge status: Home / Self Care | Payer: Managed Care, Other (non HMO) | Source: Ambulatory Visit | Attending: Family Medicine | Admitting: Family Medicine

## 2014-11-19 DIAGNOSIS — N200 Calculus of kidney: Secondary | ICD-10-CM | POA: Diagnosis not present

## 2014-11-19 DIAGNOSIS — N400 Enlarged prostate without lower urinary tract symptoms: Secondary | ICD-10-CM | POA: Diagnosis not present

## 2014-11-19 DIAGNOSIS — K760 Fatty (change of) liver, not elsewhere classified: Secondary | ICD-10-CM | POA: Diagnosis not present

## 2014-11-19 DIAGNOSIS — I2510 Atherosclerotic heart disease of native coronary artery without angina pectoris: Secondary | ICD-10-CM | POA: Diagnosis not present

## 2014-11-19 DIAGNOSIS — R1031 Right lower quadrant pain: Secondary | ICD-10-CM | POA: Diagnosis present

## 2014-11-19 MED ORDER — IOHEXOL 300 MG/ML  SOLN
100.0000 mL | Freq: Once | INTRAMUSCULAR | Status: AC | PRN
  Administered 2014-11-19: 100 mL via INTRAVENOUS

## 2015-05-04 ENCOUNTER — Ambulatory Visit (INDEPENDENT_AMBULATORY_CARE_PROVIDER_SITE_OTHER): Payer: Managed Care, Other (non HMO) | Admitting: Family Medicine

## 2015-05-04 ENCOUNTER — Encounter: Payer: Self-pay | Admitting: Family Medicine

## 2015-05-04 ENCOUNTER — Telehealth: Payer: Self-pay

## 2015-05-04 VITALS — BP 136/80 | HR 88 | Temp 98.4°F | Resp 16 | Wt 183.0 lb

## 2015-05-04 DIAGNOSIS — R071 Chest pain on breathing: Secondary | ICD-10-CM

## 2015-05-04 DIAGNOSIS — J3089 Other allergic rhinitis: Secondary | ICD-10-CM | POA: Diagnosis not present

## 2015-05-04 DIAGNOSIS — R110 Nausea: Secondary | ICD-10-CM | POA: Diagnosis not present

## 2015-05-04 DIAGNOSIS — R0789 Other chest pain: Secondary | ICD-10-CM

## 2015-05-04 DIAGNOSIS — R112 Nausea with vomiting, unspecified: Secondary | ICD-10-CM | POA: Diagnosis not present

## 2015-05-04 DIAGNOSIS — H8110 Benign paroxysmal vertigo, unspecified ear: Secondary | ICD-10-CM | POA: Insufficient documentation

## 2015-05-04 MED ORDER — ONDANSETRON 8 MG PO TBDP: 8.0000 mg | ORAL_TABLET | Freq: Three times a day (TID) | ORAL | Status: DC | PRN

## 2015-05-04 MED ORDER — FLUTICASONE PROPIONATE 50 MCG/ACT NA SUSP: 2.0000 | Freq: Every day | NASAL | Status: DC

## 2015-05-04 MED ORDER — ESOMEPRAZOLE MAGNESIUM 40 MG PO CPDR: 40.0000 mg | DELAYED_RELEASE_CAPSULE | Freq: Every day | ORAL | Status: DC

## 2015-05-04 NOTE — Telephone Encounter (Signed)
error 

## 2015-05-04 NOTE — Progress Notes (Signed)
Patient ID: VIKTOR PHILIPP, male   DOB: 1955/12/28, 59 y.o.   MRN: [id number]         Patient: ARMOND CUTHRELL Male    DOB: [birth year]   59 y.o.   MRN: [id number] Visit Date: 05/04/2015  Today's Provider: Lorie Phenix, MD   Chief Complaint  Patient presents with  . Nausea  . Emesis   Subjective:    Emesis  This is a new problem. The current episode started in the past 7 days. The problem occurs 2 to 4 times per day. The problem has been unchanged. The emesis has an appearance of stomach contents. Associated symptoms include abdominal pain, chills, coughing, dizziness and headaches. Pertinent negatives include no diarrhea, fever, myalgias or URI. Chest pain: Chest was hurting last night; but has resolved today. With coughing or moving a certain way.   Treatments tried: Muscinex. The treatment provided no relief.  Dizziness This is a new problem. The current episode started in the past 7 days (3 days. ). The problem occurs daily. The problem has been unchanged. Associated symptoms include abdominal pain, chills, congestion, coughing, fatigue, headaches, nausea, vertigo (has had vertigo previously. ) and vomiting. Pertinent negatives include no diaphoresis, fever, myalgias or sore throat. Chest pain: Chest was hurting last night; but has resolved today. With coughing or moving a certain way.   The symptoms are aggravated by standing (Position change). He has tried acetaminophen and NSAIDs for the symptoms. The treatment provided no relief.       No Known Allergies Previous Medications   RANITIDINE (ZANTAC) 150 MG TABLET    Take 150 mg by mouth 2 (two) times daily.    Review of Systems  Constitutional: Positive for chills and fatigue. Negative for fever, diaphoresis, activity change, appetite change and unexpected weight change.  HENT: Positive for congestion, ear pain (Ears feel congested and popping.), nosebleeds, rhinorrhea, sinus pressure and tinnitus. Negative for ear discharge,  postnasal drip, sneezing, sore throat, trouble swallowing and voice change.   Eyes: Positive for discharge and itching. Negative for photophobia, pain, redness and visual disturbance.  Respiratory: Positive for cough. Negative for apnea, choking, chest tightness, shortness of breath, wheezing and stridor.   Cardiovascular: Negative for palpitations and leg swelling. Chest pain: Chest was hurting last night; but has resolved today. With coughing or moving a certain way.    Gastrointestinal: Positive for nausea, vomiting and abdominal pain. Negative for diarrhea, constipation, blood in stool, abdominal distention, anal bleeding and rectal pain.  Musculoskeletal: Negative for myalgias.  Neurological: Positive for dizziness, vertigo (has had vertigo previously. ), light-headedness and headaches.    Social History  Substance Use Topics  . Smoking status: Never Smoker   . Smokeless tobacco: Never Used  . Alcohol Use: No   Objective:   BP 136/80 mmHg  Pulse 88  Temp(Src) 98.4 F (36.9 C) (Oral)  Resp 16  Wt 183 lb (83.008 kg)  Physical Exam  Constitutional: He is oriented to person, place, and time. He appears well-developed and well-nourished.  HENT:  Head: Normocephalic and atraumatic.  Right Ear: External ear normal.  Left Ear: External ear normal.  Nose: Nose normal.  Mouth/Throat: Oropharynx is clear and moist.  Eyes: Conjunctivae are normal. Pupils are equal, round, and reactive to light.  Neck: Normal range of motion. Neck supple.  Cardiovascular: Normal rate and regular rhythm.   Pulmonary/Chest: Effort normal and breath sounds normal.  Musculoskeletal: He exhibits tenderness.  Tender over area of pain.   Neurological:  He is alert and oriented to person, place, and time.  Psychiatric: He has a normal mood and affect. His behavior is normal. Judgment and thought content normal.      Assessment & Plan:     1. Nausea Seems to be recurrent problem. Difficult to get clear  history, but does look like has been recurrent, along with reflux for awhile. Will refer to GI and restart Nexium.  - esomeprazole (NEXIUM) 40 MG capsule; Take 1 capsule (40 mg total) by mouth daily at 12 noon.  Dispense: 30 capsule; Refill: 5 - Ambulatory referral to Gastroenterology  2. Nausea and vomiting, vomiting of unspecified type Some better today. Will refill medication and refer as above.  - ondansetron (ZOFRAN-ODT) 8 MG disintegrating tablet; Take 1 tablet (8 mg total) by mouth every 8 (eight) hours as needed for nausea or vomiting.  Dispense: 20 tablet; Refill: 0  3. Benign paroxysmal positional nystagmus, unspecified laterality Continue medication.   4. Other allergic rhinitis Worsening. Will refill.  - fluticasone (FLONASE) 50 MCG/ACT nasal spray; Place 2 sprays into both nostrils daily.  Dispense: 16 g; Refill: 6  5. Costochondral chest pain Reassurance given.  Call if worsens or does not improve.        Lorie PhenixNancy Brytney Somes, MD  [facility]

## 2015-05-05 ENCOUNTER — Telehealth: Payer: Self-pay

## 2015-05-05 NOTE — Telephone Encounter (Signed)
Patient wanted to let you know that he does not get to see Dr. Servando SnareWohl till december 13th because he is booked until then and then need extend [facility]. He does not feel well today and he could not go back today. Please pt know does he need to just change it on his work note that he already has or pick a new one up?.-aa

## 2015-05-05 NOTE — Telephone Encounter (Signed)
Ok to change note or we can write him a new one. Might be better.   Is he improving at all? Thanks.

## 2015-05-06 NOTE — Telephone Encounter (Signed)
Please call patient back.  His call back is [phone]  Thanks teri

## 2015-05-06 NOTE — Telephone Encounter (Signed)
Pt's wife is going to pick up the note.  Thanks,   -Vernona RiegerLaura

## 2015-06-17 DIAGNOSIS — Z87438 Personal history of other diseases of male genital organs: Secondary | ICD-10-CM | POA: Insufficient documentation

## 2015-06-17 DIAGNOSIS — R161 Splenomegaly, not elsewhere classified: Secondary | ICD-10-CM | POA: Insufficient documentation

## 2015-06-17 DIAGNOSIS — Z8719 Personal history of other diseases of the digestive system: Secondary | ICD-10-CM | POA: Insufficient documentation

## 2015-06-17 DIAGNOSIS — E1169 Type 2 diabetes mellitus with other specified complication: Secondary | ICD-10-CM | POA: Insufficient documentation

## 2015-06-17 DIAGNOSIS — R519 Headache, unspecified: Secondary | ICD-10-CM | POA: Insufficient documentation

## 2015-06-17 DIAGNOSIS — K219 Gastro-esophageal reflux disease without esophagitis: Secondary | ICD-10-CM | POA: Insufficient documentation

## 2015-06-17 DIAGNOSIS — K529 Noninfective gastroenteritis and colitis, unspecified: Secondary | ICD-10-CM | POA: Insufficient documentation

## 2015-06-17 DIAGNOSIS — R748 Abnormal levels of other serum enzymes: Secondary | ICD-10-CM | POA: Insufficient documentation

## 2015-06-17 DIAGNOSIS — N1330 Unspecified hydronephrosis: Secondary | ICD-10-CM | POA: Insufficient documentation

## 2015-06-17 DIAGNOSIS — Z8709 Personal history of other diseases of the respiratory system: Secondary | ICD-10-CM | POA: Insufficient documentation

## 2015-06-17 DIAGNOSIS — R1011 Right upper quadrant pain: Secondary | ICD-10-CM | POA: Insufficient documentation

## 2015-06-17 DIAGNOSIS — R51 Headache: Secondary | ICD-10-CM

## 2015-06-17 DIAGNOSIS — E785 Hyperlipidemia, unspecified: Secondary | ICD-10-CM | POA: Insufficient documentation

## 2015-06-21 ENCOUNTER — Ambulatory Visit (INDEPENDENT_AMBULATORY_CARE_PROVIDER_SITE_OTHER): Payer: Managed Care, Other (non HMO) | Admitting: Gastroenterology

## 2015-06-21 ENCOUNTER — Encounter: Payer: Self-pay | Admitting: Gastroenterology

## 2015-06-21 VITALS — BP 182/75 | HR 88 | Temp 98.7°F | Ht 70.0 in | Wt 182.4 lb

## 2015-06-21 DIAGNOSIS — R112 Nausea with vomiting, unspecified: Secondary | ICD-10-CM | POA: Diagnosis not present

## 2015-06-21 NOTE — Progress Notes (Signed)
Gastroenterology Consultation  Referring Provider:     Anola Gurney, [place] Primary Care Physician:  Anola Gurney, [place] Primary Gastroenterologist:  Dr. Servando Snare     Reason for Consultation:     Nausea and vomiting        HPI:   Shawn Sherman is a 59 y.o. y/o male referred for consultation & management of nausea and vomiting by Dr. Anola Gurney, PA.  This patient comes in with episodes of vomiting in the morning. He reports that he is not having any problems the rest today. The patient was put on Nexium that he takes about noontime every day. He states his vomiting has been better. There is no report of any unexplained weight loss fevers chills black stools or bloody stools. The patient does have some bright red blood per rectum when he is very constipated after he has dairy products. Patient also has a history of vertigo. There is no report of any foods making him have any worse nausea or vomiting.  Past Medical History  Diagnosis Date  . GERD (gastroesophageal reflux disease)   . History of kidney stones   . Arthritis     Past Surgical History  Procedure Laterality Date  . Back surgery  08  . Anterior lat lumbar fusion Right 01/01/2013    Procedure: lumbar three-four extreme lateral interbody fusion;  Surgeon: Reinaldo Meeker, MD;  Location: [facility];  Service: Neurosurgery;  Laterality: Right;  Lumbar three-four extreme lateral interbody fusion    . Lumbar percutaneous pedicle screw 1 level N/A 01/01/2013    Procedure: lumbar three-four percutaneous pedicle screws ;  Surgeon: Reinaldo Meeker, MD;  Location: [facility];  Service: Neurosurgery;  Laterality: N/A;  lumbar three-four percutaneous pedicle screws    Prior to Admission medications   Medication Sig Start Date End Date Taking? Authorizing Provider  esomeprazole (NEXIUM) 40 MG capsule Take 1 capsule (40 mg total) by mouth daily at 12 noon. 05/04/15  Yes Lorie Phenix, MD  fluticasone [facility]) 50 MCG/ACT nasal spray Place  2 sprays into both nostrils daily. 05/04/15  Yes Lorie Phenix, MD  ondansetron (ZOFRAN-ODT) 8 MG disintegrating tablet Take 1 tablet (8 mg total) by mouth every 8 (eight) hours as needed for nausea or vomiting. 05/04/15  Yes Lorie Phenix, MD    History reviewed. No pertinent family history.   Social History  Substance Use Topics  . Smoking status: Never Smoker   . Smokeless tobacco: Current User    Types: Snuff  . Alcohol Use: No    Allergies as of 06/21/2015  . (No Known Allergies)    Review of Systems:    All systems reviewed and negative except where noted in HPI.   Physical Exam:  BP 182/75 mmHg  Pulse 88  Temp(Src) 98.7 F (37.1 C)  Ht  (1.778 m)  Wt 182 lb 6.4 oz (82.736 kg)  BMI 26.17 kg/m2 No LMP for male patient. Psych:  Alert and cooperative. Normal mood and affect. General:   Alert,  Well-developed, well-nourished, pleasant and cooperative in NAD Head:  Normocephalic and atraumatic. Eyes:  Sclera clear, no icterus.   Conjunctiva pink. Ears:  Normal auditory acuity. Nose:  No deformity, discharge, or lesions. Mouth:  No deformity or lesions,oropharynx pink & moist. Neck:  Supple; no masses or thyromegaly. Lungs:  Respirations even and unlabored.  Clear throughout to auscultation.   No wheezes, crackles, or rhonchi. No acute distress. Heart:  Regular rate and rhythm; no  murmurs, clicks, rubs, or gallops. Abdomen:  Normal bowel sounds.  No bruits.  Soft, non-tender and non-distended without masses, hepatosplenomegaly or hernias noted.  No guarding or rebound tenderness.  Negative Carnett sign.   Rectal:  Deferred.  Msk:  Symmetrical without gross deformities.  Good, equal movement & strength bilaterally. Pulses:  Normal pulses noted. Extremities:  No clubbing or edema.  No cyanosis. Neurologic:  Alert and oriented x3;  grossly normal neurologically. Skin:  Intact without significant lesions or rashes.  No jaundice. Lymph Nodes:  No significant cervical  adenopathy. Psych:  Alert and cooperative. Normal mood and affect.  Imaging Studies: No results found.  Assessment and Plan:   Shawn Sherman is a 59 y.o. y/o male who comes in with vomiting that is usually in the morning. The patient's symptoms are most commonly associated with having reflux all night long while he is sleeping and then waking up in the morning with nausea and vomiting. The patient has been told to take the Nexium in the evening so that his blood concentration of the medication will be higher while he is sleeping. His also been set up for an upper endoscopy to rule out any sort of esophagitis/Barrett's/peptic ulcer disease as a cause of his symptoms.I have discussed risks & benefits which include, but are not limited to, bleeding, infection, perforation & drug reaction.  The patient agrees with this plan & written consent will be obtained.      Note: This dictation was prepared with Dragon dictation along with smaller phrase technology. Any transcriptional errors that result from this process are unintentional.

## 2015-06-29 ENCOUNTER — Other Ambulatory Visit: Payer: Self-pay | Admitting: Family Medicine

## 2015-06-29 DIAGNOSIS — R110 Nausea: Secondary | ICD-10-CM

## 2015-07-05 ENCOUNTER — Ambulatory Visit: Payer: Managed Care, Other (non HMO) | Admitting: Anesthesiology

## 2015-07-05 ENCOUNTER — Encounter: Payer: Self-pay | Admitting: *Deleted

## 2015-07-05 ENCOUNTER — Ambulatory Visit
Admission: RE | Admit: 2015-07-05 | Discharge: 2015-07-05 | Disposition: A | Discharge status: Home / Self Care | Payer: Managed Care, Other (non HMO) | Source: Ambulatory Visit | Attending: Gastroenterology | Admitting: Gastroenterology

## 2015-07-05 ENCOUNTER — Encounter
Admission: RE | Disposition: A | Discharge status: Home / Self Care | Payer: Self-pay | Source: Ambulatory Visit | Attending: Gastroenterology

## 2015-07-05 DIAGNOSIS — M1990 Unspecified osteoarthritis, unspecified site: Secondary | ICD-10-CM | POA: Insufficient documentation

## 2015-07-05 DIAGNOSIS — K2100 Gastro-esophageal reflux disease with esophagitis, without bleeding: Secondary | ICD-10-CM | POA: Insufficient documentation

## 2015-07-05 DIAGNOSIS — K2950 Unspecified chronic gastritis without bleeding: Secondary | ICD-10-CM | POA: Diagnosis not present

## 2015-07-05 DIAGNOSIS — Z981 Arthrodesis status: Secondary | ICD-10-CM | POA: Insufficient documentation

## 2015-07-05 DIAGNOSIS — R112 Nausea with vomiting, unspecified: Secondary | ICD-10-CM | POA: Insufficient documentation

## 2015-07-05 DIAGNOSIS — K2970 Gastritis, unspecified, without bleeding: Secondary | ICD-10-CM | POA: Diagnosis not present

## 2015-07-05 DIAGNOSIS — Z87442 Personal history of urinary calculi: Secondary | ICD-10-CM | POA: Insufficient documentation

## 2015-07-05 DIAGNOSIS — Z79899 Other long term (current) drug therapy: Secondary | ICD-10-CM | POA: Diagnosis not present

## 2015-07-05 DIAGNOSIS — Z7951 Long term (current) use of inhaled steroids: Secondary | ICD-10-CM | POA: Insufficient documentation

## 2015-07-05 HISTORY — PX: ESOPHAGOGASTRODUODENOSCOPY (EGD) WITH PROPOFOL: SHX5813

## 2015-07-05 SURGERY — ESOPHAGOGASTRODUODENOSCOPY (EGD) WITH PROPOFOL
Anesthesia: General

## 2015-07-05 MED ORDER — LIDOCAINE HCL (CARDIAC) 20 MG/ML IV SOLN
INTRAVENOUS | Status: DC | PRN
  Administered 2015-07-05: 100 mg via INTRAVENOUS

## 2015-07-05 MED ORDER — PROPOFOL 10 MG/ML IV BOLUS
INTRAVENOUS | Status: DC | PRN
  Administered 2015-07-05: 100 mg via INTRAVENOUS

## 2015-07-05 MED ORDER — SODIUM CHLORIDE 0.9 % IV SOLN
INTRAVENOUS | Status: DC
  Administered 2015-07-05: 08:00:00 via INTRAVENOUS

## 2015-07-05 MED ORDER — PROPOFOL 500 MG/50ML IV EMUL
INTRAVENOUS | Status: DC | PRN
  Administered 2015-07-05: 150 ug/kg/min via INTRAVENOUS

## 2015-07-05 MED ORDER — GLYCOPYRROLATE 0.2 MG/ML IJ SOLN
INTRAMUSCULAR | Status: DC | PRN
  Administered 2015-07-05: 0.2 mg via INTRAVENOUS

## 2015-07-05 NOTE — Anesthesia Postprocedure Evaluation (Signed)
Anesthesia Post Note  Patient: Tomma RakersCalvin J Schussler  Procedure(s) Performed: Procedure(s) (LRB): ESOPHAGOGASTRODUODENOSCOPY (EGD) WITH PROPOFOL (N/A)  Patient location during evaluation: Endoscopy Anesthesia Type: General Level of consciousness: awake and alert Pain management: pain level controlled Vital Signs Assessment: post-procedure vital signs reviewed and stable Respiratory status: spontaneous breathing, nonlabored ventilation, respiratory function stable and patient connected to nasal cannula oxygen Cardiovascular status: blood pressure returned to baseline and stable Postop Assessment: no signs of nausea or vomiting Anesthetic complications: no    Last Vitals:  Filed Vitals:   07/05/15 0850 07/05/15 0900  BP: 141/94 138/95  Pulse: 87 82  Temp:    Resp: 15 17    Last Pain: There were no vitals filed for this visit.               Lenard SimmerAndrew Yarelly Kuba

## 2015-07-05 NOTE — H&P (Signed)
  [facility]  [address] [place], [place] [address] Phone: [phone] Fax : [phone]  Primary Care Physician:  Anola Gurneyobert Chauvin, [place] Primary Gastroenterologist:  Dr. Servando SnareWohl  Pre-Procedure History & Physical: HPI:  Shawn Sherman is a 59 y.o. male is here for an endoscopy.   Past Medical History  Diagnosis Date  . GERD (gastroesophageal reflux disease)   . History of kidney stones   . Arthritis     Past Surgical History  Procedure Laterality Date  . Back surgery  08  . Anterior lat lumbar fusion Right 01/01/2013    Procedure: lumbar three-four extreme lateral interbody fusion;  Surgeon: Reinaldo Meekerandy O Kritzer, MD;  Location: [facility];  Service: Neurosurgery;  Laterality: Right;  Lumbar three-four extreme lateral interbody fusion    . Lumbar percutaneous pedicle screw 1 level N/A 01/01/2013    Procedure: lumbar three-four percutaneous pedicle screws ;  Surgeon: Reinaldo Meekerandy O Kritzer, MD;  Location: [facility];  Service: Neurosurgery;  Laterality: N/A;  lumbar three-four percutaneous pedicle screws    Prior to Admission medications   Medication Sig Start Date End Date Taking? Authorizing Provider  esomeprazole (NEXIUM) 40 MG capsule Take 1 capsule (40 mg total) by mouth daily at 12 noon. 05/04/15   Lorie PhenixNancy Maloney, MD  fluticasone (FLONASE) 50 MCG/ACT nasal spray Place 2 sprays into both nostrils daily. 05/04/15   Lorie PhenixNancy Maloney, MD  ondansetron (ZOFRAN-ODT) 8 MG disintegrating tablet DISSOLVE 1 TABLET ON THE TONGUE EVERY 8 HOURS AS NEEDED FOR NAUSEA ORVOMITING. 06/29/15   Lorie PhenixNancy Maloney, MD    Allergies as of 06/21/2015  . (No Known Allergies)    History reviewed. No pertinent family history.  Social History   Social History  . Marital Status: Married    Spouse Name: N/A  . Number of Children: N/A  . Years of Education: N/A   Occupational History  . Not on file.   Social History Main Topics  . Smoking status: Never Smoker   . Smokeless tobacco: Current  User    Types: Snuff  . Alcohol Use: No  . Drug Use: No  . Sexual Activity: Not on file   Other Topics Concern  . Not on file   Social History Narrative    Review of Systems: See HPI, otherwise negative ROS  Physical Exam: BP 160/83 mmHg  Pulse 95  Temp(Src) 97.9 F (36.6 C) (Tympanic)  Resp 18  Ht 5\' 10"  (1.778 m)  Wt 182 lb (82.555 kg)  BMI 26.11 kg/m2  SpO2 98% General:   Alert,  pleasant and cooperative in NAD Head:  Normocephalic and atraumatic. Neck:  Supple; no masses or thyromegaly. Lungs:  Clear throughout to auscultation.    Heart:  Regular rate and rhythm. Abdomen:  Soft, nontender and nondistended. Normal bowel sounds, without guarding, and without rebound.   Neurologic:  Alert and  oriented x4;  grossly normal neurologically.  Impression/Plan: Shawn RakersCalvin J Sherman is here for an endoscopy to be performed for nausea and vomiting  Risks, benefits, limitations, and alternatives regarding  endoscopy have been reviewed with the patient.  Questions have been answered.  All parties agreeable.   Darlina Rumpfaren Ardell Makarewicz, MD  07/05/2015, 8:05 AM

## 2015-07-05 NOTE — Transfer of Care (Signed)
Immediate Anesthesia Transfer of Care Note  Patient: Shawn Sherman  Procedure(s) Performed: Procedure(s): ESOPHAGOGASTRODUODENOSCOPY (EGD) WITH PROPOFOL (N/A)  Patient Location: Endoscopy Unit  Anesthesia Type:General  Level of Consciousness: awake, alert  and oriented  Airway & Oxygen Therapy: Patient Spontanous Breathing and Patient connected to nasal cannula oxygen  Post-op Assessment: Report given to RN and Post -op Vital signs reviewed and stable  Post vital signs: Reviewed and stable  Last Vitals:  Filed Vitals:   07/05/15 0742 07/05/15 0830  BP: 160/83   Pulse: 95   Temp: 36.6 C 37 C  Resp: 18     Complications: No apparent anesthesia complications

## 2015-07-05 NOTE — Anesthesia Preprocedure Evaluation (Signed)
Anesthesia Evaluation  Patient identified by MRN, date of birth, ID band Patient awake    Reviewed: Allergy & Precautions, H&P , NPO status , Patient's Chart, lab work & pertinent test results, reviewed documented beta blocker date and time   History of Anesthesia Complications Negative for: history of anesthetic complications  Airway Mallampati: I  TM Distance: >3 FB Neck ROM: full    Dental no notable dental hx. (+) Caps, Chipped, Missing   Pulmonary neg shortness of breath, neg sleep apnea, neg COPD, Recent URI  (Current sinus pressure and drainage),    Pulmonary exam normal breath sounds clear to auscultation       Cardiovascular Exercise Tolerance: Good negative cardio ROS Normal cardiovascular exam Rhythm:regular Rate:Normal     Neuro/Psych  Headaches, neg Seizures negative psych ROS   GI/Hepatic Neg liver ROS, GERD  Medicated and Controlled,  Endo/Other  negative endocrine ROS  Renal/GU Renal disease (kidney stones)  negative genitourinary   Musculoskeletal   Abdominal   Peds  Hematology negative hematology ROS (+)   Anesthesia Other Findings Past Medical History:   GERD (gastroesophageal reflux disease)                       History of kidney stones                                     Arthritis                                                    Reproductive/Obstetrics negative OB ROS                             Anesthesia Physical Anesthesia Plan  ASA: II  Anesthesia Plan: General   Post-op Pain Management:    Induction:   Airway Management Planned:   Additional Equipment:   Intra-op Plan:   Post-operative Plan:   Informed Consent: I have reviewed the patients History and Physical, chart, labs and discussed the procedure including the risks, benefits and alternatives for the proposed anesthesia with the patient or authorized representative who has indicated his/her  understanding and acceptance.   Dental Advisory Given  Plan Discussed with: Anesthesiologist, CRNA and Surgeon  Anesthesia Plan Comments:         Anesthesia Quick Evaluation

## 2015-07-05 NOTE — Op Note (Signed)
[facility] Gastroenterology Patient Name: Shawn SorrowCalvin Sherman Procedure Date: 07/05/2015 8:13 AM MRN: [id number] Account #: [id number] Date of Birth: 20-Nov-1955 Admit Type: Outpatient Age: [age] Room[facility] ROOM 4 Gender: Male Note Status: Finalized Procedure:         Upper GI endoscopy Indications:       Nausea with vomiting Providers:         Midge Miniumarren Jamae Tison, MD Referring MD:      Toni ArthursBob Chauvin, PA (Referring MD) Medicines:         Propofol per Anesthesia Complications:     No immediate complications. Procedure:         Pre-Anesthesia Assessment:                    - Prior to the procedure, a History and Physical was                     performed, and patient medications and allergies were                     reviewed. The patient's tolerance of previous anesthesia                     was also reviewed. The risks and benefits of the procedure                     and the sedation options and risks were discussed with the                     patient. All questions were answered, and informed consent                     was obtained. Prior Anticoagulants: The patient has taken                     no previous anticoagulant or antiplatelet agents. ASA                     Grade Assessment: II - A patient with mild systemic                     disease. After reviewing the risks and benefits, the                     patient was deemed in satisfactory condition to undergo                     the procedure.                    After obtaining informed consent, the endoscope was passed                     under direct vision. Throughout the procedure, the                     patient's blood pressure, pulse, and oxygen saturations                     were monitored continuously. The Endoscope was introduced                     through the mouth, and advanced to the second part of  duodenum. The upper GI endoscopy was accomplished without   difficulty. The patient tolerated the procedure well. Findings:      LA Grade A (one or more mucosal breaks less than 5 mm, not extending       between tops of 2 mucosal folds) esophagitis with no bleeding was found       at the gastroesophageal junction.      Localized mild inflammation characterized by erythema was found in the       gastric antrum. Biopsies were taken with a cold forceps for histology.      The examined duodenum was normal. Impression:        - LA Grade A reflux esophagitis.                    - Gastritis. Biopsied.                    - Normal examined duodenum. Recommendation:    - Await pathology results. Procedure Code(s): --- Professional ---                    [id number], Esophagogastroduodenoscopy, flexible, transoral;                     with biopsy, single or multiple Diagnosis Code(s): --- Professional ---                    R11.2, Nausea with vomiting, unspecified                    K21.0, Gastro-esophageal reflux disease with esophagitis                    K29.70, Gastritis, unspecified, without bleeding CPT copyright 2014 American Medical Association. All rights reserved. The codes documented in this report are preliminary and upon coder review may  be revised to meet current compliance requirements. Midge Minium, MD 07/05/2015 8:33:05 AM This report has been signed electronically. Number of Addenda: 0 Note Initiated On: 07/05/2015 8:13 AM      [facility]

## 2015-07-06 ENCOUNTER — Encounter: Payer: Self-pay | Admitting: Gastroenterology

## 2015-07-06 LAB — SURGICAL PATHOLOGY

## 2015-07-12 ENCOUNTER — Encounter: Payer: Self-pay | Admitting: Gastroenterology

## 2015-08-16 ENCOUNTER — Telehealth: Payer: Self-pay | Admitting: Family Medicine

## 2015-08-16 NOTE — Telephone Encounter (Signed)
Spoke with wife on phone patient was unable to reach because he was leaving to work 3rd shift, I contacted Dr. Servando Snare office and requested a call back on there voice line for further clarification in regards to medication. KW

## 2015-08-16 NOTE — Telephone Encounter (Signed)
Is it okay to advised patient that it is okay to take Nexium BID? Please advise. KW

## 2015-08-16 NOTE — Telephone Encounter (Signed)
How long did Dr. Servando Snare want him on twice daily acid blockers for his esophagitis? I  Suspect it would be 8-12 weeks. Please conform with patient or with Dr. Annabell Sabal office.

## 2015-08-16 NOTE — Telephone Encounter (Signed)
Pt seen Dr. Smith Mince and he told him that he needs to take the Nexium bid  Instead of Once a day.  He did an EGD.  He uses [facility].  Pts call back is  [phone]  [facility]

## 2015-08-17 ENCOUNTER — Other Ambulatory Visit: Payer: Self-pay | Admitting: Family Medicine

## 2015-08-17 DIAGNOSIS — K209 Esophagitis, unspecified without bleeding: Secondary | ICD-10-CM

## 2015-08-17 DIAGNOSIS — R110 Nausea: Secondary | ICD-10-CM

## 2015-08-17 MED ORDER — ESOMEPRAZOLE MAGNESIUM 40 MG PO CPDR: 40.0000 mg | DELAYED_RELEASE_CAPSULE | Freq: Two times a day (BID) | ORAL | Status: DC

## 2015-08-17 NOTE — Telephone Encounter (Signed)
Presciption has been called into pharmacy, patient was notified. KW

## 2015-08-17 NOTE — Telephone Encounter (Signed)
Please call in medication as updated in the EMR. Please try to contact Dr. Servando Snare again about length of double therapy.

## 2015-08-17 NOTE — Telephone Encounter (Signed)
Spoke with patient on phone Dr. Servando Snare instructed patient to take two Nexium BID, he states that Dr. Servando Snare did not give an exact time frame of how long he is suppose to be on medication BID. Patient states that he is currently out of medication and would like for you to send in refill to [facility]. Please advise. KW

## 2015-08-17 NOTE — Telephone Encounter (Signed)
Pt  Is returning call.  CB#[phone]/MW

## 2015-09-16 ENCOUNTER — Other Ambulatory Visit: Payer: Self-pay | Admitting: Family Medicine

## 2015-09-16 DIAGNOSIS — K209 Esophagitis, unspecified without bleeding: Secondary | ICD-10-CM

## 2015-09-16 MED ORDER — ESOMEPRAZOLE MAGNESIUM 40 MG PO CPDR: DELAYED_RELEASE_CAPSULE | ORAL | Status: DC

## 2016-08-23 ENCOUNTER — Other Ambulatory Visit: Payer: Self-pay | Admitting: Family Medicine

## 2016-08-23 DIAGNOSIS — K209 Esophagitis, unspecified without bleeding: Secondary | ICD-10-CM

## 2016-08-23 MED ORDER — ESOMEPRAZOLE MAGNESIUM 40 MG PO CPDR: DELAYED_RELEASE_CAPSULE | ORAL | 0 refills | Status: DC

## 2016-11-26 ENCOUNTER — Other Ambulatory Visit: Payer: Self-pay | Admitting: Family Medicine

## 2016-11-26 ENCOUNTER — Telehealth: Payer: Self-pay | Admitting: Family Medicine

## 2016-11-26 DIAGNOSIS — K209 Esophagitis, unspecified without bleeding: Secondary | ICD-10-CM

## 2016-11-26 MED ORDER — ESOMEPRAZOLE MAGNESIUM 40 MG PO CPDR: DELAYED_RELEASE_CAPSULE | ORAL | 0 refills | Status: DC

## 2016-11-26 NOTE — Telephone Encounter (Signed)
Have refilled his medication

## 2016-11-26 NOTE — Telephone Encounter (Signed)
Attempted to contact patient but voicemail box was not set up to leave message.  Last time medication was filled was on 08/23/16 but according to your pharmacy message patient is due for another office visit. Please review and advise. KW

## 2016-11-26 NOTE — Telephone Encounter (Signed)
[facility] faxed a request on the following medication. Thanks CC ° °esomeprazole (NEXIUM) 40 MG capsule  °Take 1 capsule by mouth once daily. °

## 2017-03-04 ENCOUNTER — Telehealth: Payer: Self-pay | Admitting: Family Medicine

## 2017-03-04 NOTE — Telephone Encounter (Signed)
Shawn Sherman faxed a request on the following medication. Thanks CC  esomeprazole (NEXIUM) 40 MG capsule  Take 1 capsule by mouth once daily.

## 2017-03-04 NOTE — Telephone Encounter (Signed)
Please review

## 2017-03-05 ENCOUNTER — Emergency Department
Admission: EM | Admit: 2017-03-05 | Discharge: 2017-03-05 | Disposition: A | Discharge status: Home / Self Care | Payer: Managed Care, Other (non HMO) | Attending: Emergency Medicine | Admitting: Emergency Medicine

## 2017-03-05 ENCOUNTER — Other Ambulatory Visit: Payer: Self-pay

## 2017-03-05 ENCOUNTER — Emergency Department: Payer: Managed Care, Other (non HMO)

## 2017-03-05 ENCOUNTER — Other Ambulatory Visit: Payer: Self-pay | Admitting: Family Medicine

## 2017-03-05 ENCOUNTER — Encounter: Payer: Self-pay | Admitting: Emergency Medicine

## 2017-03-05 DIAGNOSIS — G44009 Cluster headache syndrome, unspecified, not intractable: Secondary | ICD-10-CM | POA: Insufficient documentation

## 2017-03-05 DIAGNOSIS — K209 Esophagitis, unspecified without bleeding: Secondary | ICD-10-CM

## 2017-03-05 DIAGNOSIS — R519 Headache, unspecified: Secondary | ICD-10-CM

## 2017-03-05 DIAGNOSIS — F17220 Nicotine dependence, chewing tobacco, uncomplicated: Secondary | ICD-10-CM | POA: Diagnosis not present

## 2017-03-05 DIAGNOSIS — I10 Essential (primary) hypertension: Secondary | ICD-10-CM | POA: Diagnosis present

## 2017-03-05 DIAGNOSIS — Z79899 Other long term (current) drug therapy: Secondary | ICD-10-CM | POA: Diagnosis not present

## 2017-03-05 DIAGNOSIS — R51 Headache: Secondary | ICD-10-CM

## 2017-03-05 LAB — COMPREHENSIVE METABOLIC PANEL
ALK PHOS: 79 U/L (ref 38–126)
ALT: 23 U/L (ref 17–63)
AST: 23 U/L (ref 15–41)
Albumin: 4 g/dL (ref 3.5–5.0)
Anion gap: 7 (ref 5–15)
BILIRUBIN TOTAL: 1.1 mg/dL (ref 0.3–1.2)
BUN: 13 mg/dL (ref 6–20)
CALCIUM: 8.6 mg/dL — AB (ref 8.9–10.3)
CO2: 24 mmol/L (ref 22–32)
CREATININE: 0.84 mg/dL (ref 0.61–1.24)
Chloride: 103 mmol/L (ref 101–111)
GFR calc Af Amer: >60 mL/min (ref >60)
Glucose, Bld: 99 mg/dL (ref 65–99)
Potassium: 3.7 mmol/L (ref 3.5–5.1)
Sodium: 134 mmol/L — ABNORMAL LOW (ref 135–145)
TOTAL PROTEIN: 6.9 g/dL (ref 6.5–8.1)

## 2017-03-05 LAB — CBC WITH DIFFERENTIAL/PLATELET
BASOS ABS: 0.1 10*3/uL (ref 0–0.1)
BASOS PCT: 1 %
EOS ABS: 0.1 10*3/uL (ref 0–0.7)
EOS PCT: 1 %
HCT: 47.5 % (ref 40.0–52.0)
Hemoglobin: 16.1 g/dL (ref 13.0–18.0)
Lymphocytes Relative: 11 %
Lymphs Abs: 1.3 10*3/uL (ref 1.0–3.6)
MCH: 27.7 pg (ref 26.0–34.0)
MCHC: 33.8 g/dL (ref 32.0–36.0)
MCV: 81.9 fL (ref 80.0–100.0)
MONO ABS: 0.5 10*3/uL (ref 0.2–1.0)
MONOS PCT: 5 %
Neutro Abs: 9.5 10*3/uL — ABNORMAL HIGH (ref 1.4–6.5)
Neutrophils Relative %: 82 %
PLATELETS: 190 10*3/uL (ref 150–440)
RBC: 5.8 MIL/uL (ref 4.40–5.90)
RDW: 14.3 % (ref 11.5–14.5)
WBC: 11.5 10*3/uL — ABNORMAL HIGH (ref 3.8–10.6)

## 2017-03-05 LAB — TROPONIN I

## 2017-03-05 MED ORDER — ESOMEPRAZOLE MAGNESIUM 40 MG PO CPDR: DELAYED_RELEASE_CAPSULE | ORAL | 0 refills | Status: DC

## 2017-03-05 MED ORDER — METOCLOPRAMIDE HCL 5 MG/ML IJ SOLN
10.0000 mg | Freq: Once | INTRAMUSCULAR | Status: AC
  Administered 2017-03-05: 10 mg via INTRAVENOUS
  Filled 2017-03-05: qty 2

## 2017-03-05 MED ORDER — DIPHENHYDRAMINE HCL 50 MG/ML IJ SOLN
25.0000 mg | Freq: Once | INTRAMUSCULAR | Status: AC
  Administered 2017-03-05: 25 mg via INTRAVENOUS
  Filled 2017-03-05: qty 1

## 2017-03-05 NOTE — Telephone Encounter (Signed)
Refilled: hx of esophagitis 

## 2017-03-05 NOTE — ED Provider Notes (Signed)
[facility] Emergency Department Provider Note ____________________________________________   First MD Initiated Contact with Patient 03/05/17 1701     (approximate)  I have reviewed the triage vital signs and the nursing notes.   HISTORY  Chief Complaint Hypertension    HPI Shawn Sherman is a 61 y.o. male with a history of arthritis and GERD who presents with headache for one day, acute onset when he awoke yesterday and persisting throughout the day, somewhat improved today. patient states that headache is constant, not throbbing, bitemporal in location. There are no associated vision changes. There is no photophobia or phonophobia. No neck stiffness. No prior history of this headache although patient states he has had headaches in the past.  Patient also reports some intermittent upper abdominal discomfort bilaterally since yesterday but it is not present currently. No associated nausea vomiting or diarrhea. No fevers or flank pain and no urinary symptoms.  Patient reports intermittent tingling in his left hand yesterday, but it is now resolved. He states he went to urgent care today for evaluation and his blood pressure was elevated in the range of 200/100, so he was sent to the emergency department for evaluation.  Past Medical History:  Diagnosis Date  . Arthritis   . GERD (gastroesophageal reflux disease)   . History of kidney stones     Patient Active Problem List   Diagnosis Date Noted  . Nausea with vomiting   . Reflux esophagitis   . Gastritis   . Abdominal pain, right upper quadrant 06/17/2015  . Abnormal liver enzymes 06/17/2015  . History of hay fever 06/17/2015  . H/O male genital system disorder 06/17/2015  . H/O gastrointestinal disease 06/17/2015  . Acute gastroenteritis 06/17/2015  . Acid reflux 06/17/2015  . Cephalalgia 06/17/2015  . Hydronephrosis 06/17/2015  . H/O elevated lipids 06/17/2015  . Enlargement of spleen 06/17/2015    . Benign paroxysmal positional nystagmus 05/04/2015    Past Surgical History:  Procedure Laterality Date  . ANTERIOR LAT LUMBAR FUSION Right 01/01/2013   Procedure: lumbar three-four extreme lateral interbody fusion;  Surgeon: Reinaldo Meeker, MD;  Location: [facility];  Service: Neurosurgery;  Laterality: Right;  Lumbar three-four extreme lateral interbody fusion    . BACK SURGERY  08  . ESOPHAGOGASTRODUODENOSCOPY (EGD) WITH PROPOFOL N/A 07/05/2015   Procedure: ESOPHAGOGASTRODUODENOSCOPY (EGD) WITH PROPOFOL;  Surgeon: Midge Minium, MD;  Location: [facility] ENDOSCOPY;  Service: Endoscopy;  Laterality: N/A;  . LUMBAR PERCUTANEOUS PEDICLE SCREW 1 LEVEL N/A 01/01/2013   Procedure: lumbar three-four percutaneous pedicle screws ;  Surgeon: Reinaldo Meeker, MD;  Location: [facility];  Service: Neurosurgery;  Laterality: N/A;  lumbar three-four percutaneous pedicle screws    Prior to Admission medications   Medication Sig Start Date End Date Taking? Authorizing Provider  esomeprazole (NEXIUM) 40 MG capsule One daily 03/05/17   Anola Gurney, PA  fluticasone (FLONASE) 50 MCG/ACT nasal spray Place 2 sprays into both nostrils daily. 05/04/15   Lorie Phenix, MD  ondansetron (ZOFRAN-ODT) 8 MG disintegrating tablet DISSOLVE 1 TABLET ON THE TONGUE EVERY 8 HOURS AS NEEDED FOR NAUSEA ORVOMITING. 06/29/15   Lorie Phenix, MD    Allergies Patient has no known allergies.  History reviewed. No pertinent family history.  Social History Social History  Substance Use Topics  . Smoking status: Never Smoker  . Smokeless tobacco: Current User    Types: Snuff  . Alcohol use No    Review of Systems  Constitutional: No fever/chills Eyes: No  visual changes. ENT: No sore throat. Cardiovascular: Denies chest pain. Respiratory: Denies shortness of breath. Gastrointestinal: No nausea, no vomiting.  No diarrhea. Positive for upper abdominal pain. Genitourinary: Negative for dysuria.  Musculoskeletal:  Negative for back pain. Skin: Negative for rash. Neurological: positive for headaches, negative for focal weakness or numbness.   ____________________________________________   PHYSICAL EXAM:  VITAL SIGNS: ED Triage Vitals  Enc Vitals Group     BP 03/05/17 1557 (!) 165/99     Pulse Rate 03/05/17 1557 89     Resp 03/05/17 1557 18     Temp 03/05/17 1557 98.2 F (36.8 C)     Temp Source 03/05/17 1557 Oral     SpO2 03/05/17 1557 97 %     Weight 03/05/17 1558 182 lb (82.6 kg)     Height --      Head Circumference --      Peak Flow --      Pain Score 03/05/17 1557 6     Pain Loc --      Pain Edu? --      Excl. in GC? --     Constitutional: Alert and oriented. Well appearing and in no acute distress. Eyes: Conjunctivae are normal. EOMI. PERRLA. Head: Atraumatic. Nose: No congestion/rhinnorhea. Mouth/Throat: Mucous membranes are moist.   Neck: Normal range of motion. No meningeal signs. Cardiovascular: Normal rate, regular rhythm. Grossly normal heart sounds.  Good peripheral circulation. Respiratory: Normal respiratory effort.  No retractions. Lungs CTAB. Gastrointestinal: Soft and nontender. No distention.  Genitourinary: No CVA tenderness. Musculoskeletal: No lower extremity edema.  Extremities warm and well perfused.  Neurologic:  Normal speech and language. No gross focal neurologic deficits are appreciated. 5 out of 5 motor strength and intact sensation in all extremities. Cranial nerves III through XII intact. Normal coordination, finger-to-nose, and rapid alternating movements. No pronator drift. Skin:  Skin is warm and dry. No rash noted. Psychiatric: Mood and affect are normal. Speech and behavior are normal.  ____________________________________________   LABS (all labs ordered are listed, but only abnormal results are displayed)  Labs Reviewed  COMPREHENSIVE METABOLIC PANEL - Abnormal; Notable for the following:       Result Value   Sodium 134 (*)    Calcium  8.6 (*)    All other components within normal limits  CBC WITH DIFFERENTIAL/PLATELET - Abnormal; Notable for the following:    WBC 11.5 (*)    Neutro Abs 9.5 (*)    All other components within normal limits  TROPONIN I   ____________________________________________  EKG  ED ECG REPORT I, Dionne Bucy, the attending physician, personally viewed and interpreted this ECG.  Date: 03/05/2017 EKG Time: 1726 Rate: 64 Rhythm: normal sinus rhythm QRS Axis: normal Intervals: normal ST/T Wave abnormalities: LVH, no ST-T wave abnormalities Narrative Interpretation: no evidence of acute ischemia  ____________________________________________  RADIOLOGY  CT head negative for acute findings  ____________________________________________   PROCEDURES  Procedure(s) performed: No    Critical Care performed: No ____________________________________________   INITIAL IMPRESSION / ASSESSMENT AND PLAN / ED COURSE  Pertinent labs & imaging results that were available during my care of the patient were reviewed by me and considered in my medical decision making (see chart for details).  Exceed 35-year-old male presents with 1 day of bilateral frontal headache with some intermittent and now resolved upper abdominal discomfort as well as some left hand tingling which is also now resolved. Was sent from urgent care and he was noted to be significantly  hypertensive. On exam, patient is extremely well-appearing and appears comfortable, vital signs are normal except for hypertension and his exam is otherwise unremarkable. His neuro exam is completely normal, and he has no abdominal tenderness.  Overall presentation consistent with benign cause of the headache. Patient woke up with headache, it was not thunderclap or maximal at onset, there is no photophobia, fever, meningeal signs or other signs to suggest meningitis, subarachnoid or other hemorrhage, and no neurologic abnormalities to suggest  vascular cause such as dissection or of venous thrombosis.  Given patient's age with new-onset headache, CT was obtained which was negative.  Based on no findings to suggest subarachnoid hemorrhage there is no indication to follow up with LP.  Also due to no prior diagnosis of hypertension I'm obtaining basic labs and troponin to rule out end organ dysfunction. There is no evidence of hypertensive emergency or encephalopathy. Will treat symptomatically with Reglan.  If workup negative and symptoms improved will discharge home with primary care follow-up.     ----------------------------------------- 7:35 PM on 03/05/2017 -----------------------------------------  Patient states his headache is almost completely resolved, and he continues to be extremely comfortable appearing talking and laughing with his family in the stretcher.  Blood pressure improved to 150s over 80s although has been some fluctuation to the 170s systolic. CT and lab workup are unremarkable. I had extensive discussion with patient about hypertension and the importance of close follow-up with his primary care to have blood pressure rechecked and to be started on antihypertensives if it remains elevated, although we usually do not start antihypertensives in the emergency department.  I also gave her return precautions for the headache including any new, worsening, or persistent headache, weakness, numbness or any other new or worsening symptoms that concern him. Patient states he will call his primary care office tomorrow to schedule follow-up.   ____________________________________________   FINAL CLINICAL IMPRESSION(S) / ED DIAGNOSES  Final diagnoses:  Hypertension, unspecified type  Acute nonintractable headache, unspecified headache type      NEW MEDICATIONS STARTED DURING THIS VISIT:  Discharge Medication List as of 03/05/2017  7:39 PM       Note:  This document was prepared using Dragon voice recognition  software and may include unintentional dictation errors.    Dionne Bucy, MD 03/06/17 [phone]

## 2017-03-05 NOTE — Discharge Instructions (Signed)
Return to the ER immediately for new or worsening headache, recurrent severe headache, weakness or numbness, vision changes, vomiting, or any other new or worsening symptoms that concern you.  Call your primary care tomorrow to schedule an appointment within 1-2 weeks.

## 2017-03-05 NOTE — ED Triage Notes (Signed)
Pt to ed with c/o headache since yesterday am,  Reports went to urgent care and was sent here for elevated BP,  Pt denies chest pain, denies sob.  Appears in no acute distress.  Pt states headache is better today than yesterday.

## 2017-03-06 ENCOUNTER — Telehealth: Payer: Self-pay | Admitting: Family Medicine

## 2017-03-06 NOTE — Telephone Encounter (Signed)
Pt was [facility] on 03/05/2017 for blood pressure issues and headaches.  Pt was last seen 11/18/2014.  I have scheduled a hospital follow up appointment/MW

## 2017-03-06 NOTE — Telephone Encounter (Signed)
FYI. KW 

## 2017-03-07 ENCOUNTER — Other Ambulatory Visit: Payer: Self-pay | Admitting: Family Medicine

## 2017-03-07 ENCOUNTER — Encounter: Payer: Self-pay | Admitting: Family Medicine

## 2017-03-07 ENCOUNTER — Ambulatory Visit (INDEPENDENT_AMBULATORY_CARE_PROVIDER_SITE_OTHER): Payer: Managed Care, Other (non HMO) | Admitting: Family Medicine

## 2017-03-07 VITALS — BP 170/106 | HR 69 | Temp 97.9°F | Resp 16 | Wt 186.2 lb

## 2017-03-07 DIAGNOSIS — Z8639 Personal history of other endocrine, nutritional and metabolic disease: Secondary | ICD-10-CM

## 2017-03-07 DIAGNOSIS — N201 Calculus of ureter: Secondary | ICD-10-CM | POA: Insufficient documentation

## 2017-03-07 DIAGNOSIS — I10 Essential (primary) hypertension: Secondary | ICD-10-CM | POA: Diagnosis not present

## 2017-03-07 MED ORDER — AMLODIPINE BESYLATE 5 MG PO TABS: 5.0000 mg | ORAL_TABLET | Freq: Every day | ORAL | 3 refills | Status: DC

## 2017-03-07 NOTE — Patient Instructions (Signed)
Let me know if you can't tolerate the medication. 

## 2017-03-07 NOTE — Progress Notes (Signed)
Subjective:     Patient ID: Shawn Sherman, male   DOB: 17-Nov-1955, 61 y.o.   MRN: [id number]  HPI  Chief Complaint  Patient presents with  . Hypertension    Patient comes in office today for hosptial follow up after being [facility] 03/05/17 with complaint of headache for 24hrs. Patient reported in ER that he was seen at urgent care and blood pressure there was 200/100. Patient reports numbness in arm that is still present, and intermittent headaches.   Believes his father may have had hypertension. Continues to work for a [employer] company. Reports that his fingers were curling up last night.   Review of Systems     Objective:   Physical Exam  Constitutional: He appears well-developed and well-nourished.  Neck: Carotid bruit is not present.  Cardiovascular: Normal rate and regular rhythm.   Pulmonary/Chest: Breath sounds normal.  Musculoskeletal: He exhibits no edema (of lower extremities).       Assessment:    1. Essential hypertension - amLODipine (NORVASC) 5 MG tablet; Take 1 tablet (5 mg total) by mouth daily.  Dispense: 90 tablet; Refill: 3  2. H/O elevated lipids - Lipid panel    Plan:    Work excuse from 8/27-8/31/18. Further f/u pending labs and in 3 weeks.

## 2017-03-08 ENCOUNTER — Other Ambulatory Visit: Payer: Self-pay | Admitting: Family Medicine

## 2017-03-08 ENCOUNTER — Telehealth: Payer: Self-pay

## 2017-03-08 DIAGNOSIS — E782 Mixed hyperlipidemia: Secondary | ICD-10-CM

## 2017-03-08 LAB — LIPID PANEL
Chol/HDL Ratio: 6.3 ratio — ABNORMAL HIGH (ref 0.0–5.0)
Cholesterol, Total: 289 mg/dL — ABNORMAL HIGH (ref 100–199)
HDL: 46 mg/dL (ref >39)
LDL CALC: 208 mg/dL — AB (ref 0–99)
TRIGLYCERIDES: 173 mg/dL — AB (ref 0–149)
VLDL Cholesterol Cal: 35 mg/dL (ref 5–40)

## 2017-03-08 MED ORDER — ATORVASTATIN CALCIUM 80 MG PO TABS: 80.0000 mg | ORAL_TABLET | Freq: Every day | ORAL | 3 refills | Status: DC

## 2017-03-08 NOTE — Telephone Encounter (Signed)
-----   Message from Anola Gurneyobert Chauvin, [place] sent at 03/08/2017  7:43 AM EDT ----- Your "bad cholesterol" is very high. Would like to start you on cholesterol medication if you agree.

## 2017-03-08 NOTE — Telephone Encounter (Signed)
Pt advised.   He agreed to start a cholesterol medication.  Please send to [facility].   Thanks,   -Vernona RiegerLaura

## 2017-03-29 ENCOUNTER — Encounter: Payer: Self-pay | Admitting: Family Medicine

## 2017-03-29 ENCOUNTER — Ambulatory Visit (INDEPENDENT_AMBULATORY_CARE_PROVIDER_SITE_OTHER): Payer: Managed Care, Other (non HMO) | Admitting: Family Medicine

## 2017-03-29 VITALS — BP 150/96 | HR 80 | Temp 97.8°F | Resp 16 | Ht 70.0 in | Wt 182.0 lb

## 2017-03-29 DIAGNOSIS — I152 Hypertension secondary to endocrine disorders: Secondary | ICD-10-CM | POA: Insufficient documentation

## 2017-03-29 DIAGNOSIS — I10 Essential (primary) hypertension: Secondary | ICD-10-CM | POA: Diagnosis not present

## 2017-03-29 HISTORY — DX: Essential (primary) hypertension: I10

## 2017-03-29 MED ORDER — AMLODIPINE BESYLATE 10 MG PO TABS: 10.0000 mg | ORAL_TABLET | Freq: Every day | ORAL | 3 refills | Status: DC

## 2017-03-29 NOTE — Progress Notes (Signed)
Subjective:     Patient ID: Shawn Sherman, male   DOB: [birth year], 61 y.o.   MRN: [id number]  HPI  Chief Complaint  Patient presents with  . Hypertension  Reports tolerance of amlodipine. He has chronic knee pain and feels it has been getting worse over the years. Currently takes Aleve and Tylenol with modest improvement. Wishes to wait on any further evaluation. He is thinking about retiring at 64. Reports he will be getting the flu vaccine at work next month.   Review of Systems     Objective:   Physical Exam  Constitutional: He appears well-developed and well-nourished. No distress.  Cardiovascular: Normal rate and regular rhythm.   Pulmonary/Chest: Breath sounds normal.  Musculoskeletal: He exhibits no edema (of lower extremities).  No knee swelling with FROM. Patellar crepitus noted bilaterally.       Assessment:    1. Essential hypertension; Increase dose for improved control - amLODipine (NORVASC) 10 MG tablet; Take 1 tablet (10 mg total) by mouth daily.  Dispense: 90 tablet; Refill: 3    Plan:   Discussed use of Tylenol,Aleve, and Zostrix topical for knee pain. Will coordinate Tetanus booster with flu shot at work.

## 2017-03-29 NOTE — Patient Instructions (Addendum)
Continue Tylenol up to 3000 mg/day for knee pain. May also continue Aleve one-two pills daily. Try Zostrix cream for your knees. Check on when you are getting the flu shot at work.

## 2017-04-19 ENCOUNTER — Encounter: Payer: Self-pay | Admitting: Family Medicine

## 2017-04-19 ENCOUNTER — Ambulatory Visit (INDEPENDENT_AMBULATORY_CARE_PROVIDER_SITE_OTHER): Payer: Managed Care, Other (non HMO) | Admitting: Family Medicine

## 2017-04-19 VITALS — BP 130/80 | HR 80 | Temp 97.5°F | Resp 16 | Wt 183.0 lb

## 2017-04-19 DIAGNOSIS — E782 Mixed hyperlipidemia: Secondary | ICD-10-CM

## 2017-04-19 DIAGNOSIS — H5789 Other specified disorders of eye and adnexa: Secondary | ICD-10-CM

## 2017-04-19 DIAGNOSIS — I10 Essential (primary) hypertension: Secondary | ICD-10-CM

## 2017-04-19 LAB — LIPID PANEL
CHOLESTEROL: 143 mg/dL (ref <200)
HDL: 53 mg/dL (ref >40)
LDL Cholesterol (Calc): 73 mg/dL (calc)
Non-HDL Cholesterol (Calc): 90 mg/dL (calc) (ref <130)
Total CHOL/HDL Ratio: 2.7 (calc) (ref <5.0)
Triglycerides: 88 mg/dL (ref <150)

## 2017-04-19 MED ORDER — KETOROLAC TROMETHAMINE 0.5 % OP SOLN: 1.0000 [drp] | Freq: Four times a day (QID) | OPHTHALMIC | 0 refills | Status: DC

## 2017-04-19 NOTE — Progress Notes (Signed)
Subjective:     Patient ID: Shawn Sherman, male   DOB: 05/18/1956, 61 y.o.   MRN: [id number]  HPI  Chief Complaint  Patient presents with  . Hypertension    Patient returns to office today for follow up, last office visit was 03/29/17 and Amlodipine was increased to  daily. Patients blood pressure at last visit was 150/96 he reports good compliance and tolerance on medication.  . Eye Pain    Patient complains of eye pain to his right eye for the past 5 days, patient reports itching and pain to the touch.   States he has intermittent discomfort in both eyes described as itching and pain. No change in vision or matting reported. Wears glasses/no contacts. 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   Review of Systems     Objective:   Physical Exam  Constitutional: He appears well-developed and well-nourished. No distress.  Eyes: Pupils are equal, round, and reactive to light. Right eye exhibits no discharge. Left eye exhibits no discharge. Right conjunctiva is not injected. Left conjunctiva is not injected.  Cardiovascular: Normal rate and regular rhythm.   Pulmonary/Chest: Breath sounds normal.  Musculoskeletal: He exhibits no edema (of lower extremities).       Assessment:    1.Essential hypertension: controlled on amlodipine  2. Mixed hyperlipidemia: continue atorvastatin - Lipid panel  3. Irritation of right eye - ketorolac (ACULAR) 0.5 % ophthalmic solution; Place 1 drop into the right eye 4 (four) times daily.  Dispense: 5 mL; Refill: 0    Plan:   Encouraged to getLODEN LAURENTat work. Further f/u pending labs and in 6 months.

## 2017-04-19 NOTE — Patient Instructions (Signed)
We will call you with the lab results. Get the flu shot at work.

## 2017-04-20 ENCOUNTER — Telehealth: Payer: Self-pay

## 2017-04-20 NOTE — Telephone Encounter (Signed)
-----   Message from Anola Gurney, [place] sent at 04/19/2017  4:32 PM EDT ----- Cholesterol is great on the medication. DON'T STOP IT!

## 2017-04-20 NOTE — Telephone Encounter (Signed)
Unable to reach patient will try and contact again on Monday. KW

## 2017-04-23 NOTE — Telephone Encounter (Signed)
Pt advised-Shawn Sherman, RMA  

## 2017-06-03 ENCOUNTER — Telehealth: Payer: Self-pay | Admitting: Family Medicine

## 2017-06-03 NOTE — Telephone Encounter (Signed)
[facility] faxed a request for the following medication. Thanks CC  esomeprazole (NEXIUM) 40 MG capsule

## 2017-06-04 ENCOUNTER — Other Ambulatory Visit: Payer: Self-pay | Admitting: Family Medicine

## 2017-06-04 DIAGNOSIS — K209 Esophagitis, unspecified without bleeding: Secondary | ICD-10-CM

## 2017-06-04 MED ORDER — ESOMEPRAZOLE MAGNESIUM 40 MG PO CPDR: DELAYED_RELEASE_CAPSULE | ORAL | 1 refills | Status: DC

## 2017-06-04 NOTE — Telephone Encounter (Signed)
Refilled: hx of esophagitis

## 2017-06-04 NOTE — Telephone Encounter (Signed)
Last filled 03/05/17 with a qtyof 90 with 0refills. KW

## 2017-08-09 ENCOUNTER — Telehealth: Payer: Self-pay | Admitting: Family Medicine

## 2017-08-09 ENCOUNTER — Other Ambulatory Visit: Payer: Self-pay | Admitting: Family Medicine

## 2017-08-09 NOTE — Telephone Encounter (Signed)
Should have a refill left.Please check with the pharmacy

## 2017-08-09 NOTE — Telephone Encounter (Signed)
Pharmacy states that patient still has one refill left. I told pharmacy to go ahead and fill for patient.KW

## 2017-08-09 NOTE — Telephone Encounter (Signed)
Patient is requesting a refill on the following medication  esomeprazole (NEXIUM) 40 MG capsule  He is aware that you said that it is time for a office visit the last time that this was sent in.  He uses [facility] in [place].

## 2017-08-09 NOTE — Telephone Encounter (Signed)
Please advise. KW 

## 2017-09-27 ENCOUNTER — Ambulatory Visit (INDEPENDENT_AMBULATORY_CARE_PROVIDER_SITE_OTHER): Payer: Managed Care, Other (non HMO) | Admitting: Family Medicine

## 2017-09-27 ENCOUNTER — Encounter: Payer: Self-pay | Admitting: Family Medicine

## 2017-09-27 VITALS — BP 140/95 | HR 82 | Temp 98.9°F | Resp 15 | Wt 181.6 lb

## 2017-09-27 DIAGNOSIS — B349 Viral infection, unspecified: Secondary | ICD-10-CM | POA: Diagnosis not present

## 2017-09-27 MED ORDER — PROMETHAZINE HCL 25 MG PO TABS: 25.0000 mg | ORAL_TABLET | Freq: Three times a day (TID) | ORAL | 0 refills | Status: DC | PRN

## 2017-09-27 NOTE — Patient Instructions (Signed)
Take the nausea medication before eating. Continue the Gatorade and take a sip every 5 minutes. Let me know how you are doing Monday.

## 2017-09-27 NOTE — Progress Notes (Signed)
Subjective:     Patient ID: Shawn Sherman, male   DOB: 1955/12/06, 62 y.o.   MRN: [id number] Chief Complaint  Patient presents with  . Vomiting    Patient comes into office today with complaints of vomiting for one week, patient states that symptom came upon sudden. Patient reports decreased energy, headache, abdominal pain, dizziness when standing and bloody nasal drainage.    HPI States he last vomited yesterday after he became dizzy when he tried to go into work. Otherwise has missed work all week. Reports sinus congestion and occasional cough. States he has been throwing up food unless he lies down after supper. No diarrhea. Hx of esophagitis but remains compliant with Nexium.  Review of Systems     Objective:   Physical Exam  Constitutional: He appears well-developed and well-nourished. No distress.  Ears: T.M's intact without inflammation Throat: no tonsillar enlargement or exudate Neck: no cervical adenopathy Lungs: clear Abdomen with mild diffuse tenderness > in epigastric area without guarding     Assessment:    1. Acute viral syndrome: rx promethazine    Plan:    Encouraged p.o. Intake over the weekend. Call Monday on how he is doing. Does not require a work excuse at this time.

## 2017-09-30 ENCOUNTER — Telehealth: Payer: Self-pay | Admitting: Family Medicine

## 2017-09-30 NOTE — Telephone Encounter (Signed)
See contact note 

## 2017-12-09 ENCOUNTER — Telehealth: Payer: Self-pay | Admitting: Family Medicine

## 2017-12-09 ENCOUNTER — Other Ambulatory Visit: Payer: Self-pay | Admitting: Family Medicine

## 2017-12-09 DIAGNOSIS — K209 Esophagitis, unspecified without bleeding: Secondary | ICD-10-CM

## 2017-12-09 MED ORDER — ESOMEPRAZOLE MAGNESIUM 40 MG PO CPDR: DELAYED_RELEASE_CAPSULE | ORAL | 1 refills | Status: DC

## 2017-12-09 NOTE — Telephone Encounter (Signed)
[facility] pharmacy faxed a refill request for the following medication. Thanks CC  esomeprazole (NEXIUM) 40 MG capsule

## 2017-12-09 NOTE — Telephone Encounter (Signed)
Last filled 06/04/17, patient has been seen in office recently for acute visit, according to last comment on refill in November you stated that it was time for office visit, please advise if I need to schedule appt for patient. Thanks. KW

## 2017-12-09 NOTE — Telephone Encounter (Signed)
Done: hx of reflux and esophagitis per endoscopy

## 2017-12-10 ENCOUNTER — Ambulatory Visit (INDEPENDENT_AMBULATORY_CARE_PROVIDER_SITE_OTHER): Payer: Managed Care, Other (non HMO) | Admitting: Family Medicine

## 2017-12-10 ENCOUNTER — Encounter: Payer: Self-pay | Admitting: Family Medicine

## 2017-12-10 VITALS — BP 158/90 | HR 59 | Temp 98.9°F | Resp 15 | Wt 181.8 lb

## 2017-12-10 DIAGNOSIS — M222X1 Patellofemoral disorders, right knee: Secondary | ICD-10-CM

## 2017-12-10 MED ORDER — MELOXICAM 15 MG PO TABS: 15.0000 mg | ORAL_TABLET | Freq: Every day | ORAL | 0 refills | Status: DC

## 2017-12-10 MED ORDER — HYDROCODONE-ACETAMINOPHEN 5-325 MG PO TABS: 1.0000 | ORAL_TABLET | Freq: Four times a day (QID) | ORAL | 0 refills | Status: AC | PRN

## 2017-12-10 NOTE — Patient Instructions (Signed)
Start prescription medication. Do not take any over the counter medication while on the prescription medication. Let me know if not improving for an orthopedic referral.

## 2017-12-10 NOTE — Progress Notes (Signed)
l Subjective:     Patient ID: Shawn Sherman, male   DOB: [birth year], 62 y.o.   MRN: [id number] Chief Complaint  Patient presents with  . Knee Pain    Patient comes in office today with complaints of right knee pain for the past two days. Patient reports swelling around the knee and pain when straightning or flexing.    HPI States he does a lot of bending and squatting at his job for 10 hour shifts. Denies specific injury-states when he got out of bed on 6/1 he noticed pain/popping and swelling above his knee. Currently not working due to a death in his family but must return on 6/6. Has been taking Aleve and Tylenol for his sx.  Review of Systems     Objective:   Physical Exam  Constitutional: He appears well-developed and well-nourished. No distress.  Musculoskeletal:  Right knee KF/KE 5/5. Can fully extend but increased pain with flexion > 90 degrees. Patellar pop when ranging reproduces his pain. Mild tenderness inferior aspect of his patella. Non-tender swelling distal medial quad. Ligaments stable.       Assessment:    1. Patellofemoral pain syndrome of right knee: will start meloxicam  - HYDROcodone-acetaminophen (NORCO/VICODIN) 5-325 MG tablet; Take 1 tablet by mouth every 6 (six) hours as needed for up to 5 days for moderate pain. One every 4-6 hours as needed for pain  Dispense: 20 tablet; Refill: 0    Plan:    Stop otc medication but use cold compresses for 20 minutes. Work excuse for 6/6-12/14/17. Will call for orthopedic referral if not improving.

## 2018-01-06 ENCOUNTER — Telehealth: Payer: Self-pay | Admitting: Family Medicine

## 2018-01-06 NOTE — Telephone Encounter (Signed)
Please review

## 2018-01-06 NOTE — Telephone Encounter (Signed)
[facility] faxed a refill request for the following medication. Thanks CC  meloxicam (MOBIC) 15 MG tablet

## 2018-01-07 ENCOUNTER — Other Ambulatory Visit: Payer: Self-pay | Admitting: Family Medicine

## 2018-01-07 MED ORDER — MELOXICAM 15 MG PO TABS: 15.0000 mg | ORAL_TABLET | Freq: Every day | ORAL | 0 refills | Status: DC

## 2018-01-07 NOTE — Telephone Encounter (Signed)
done

## 2018-02-13 ENCOUNTER — Ambulatory Visit
Admission: RE | Admit: 2018-02-13 | Discharge: 2018-02-13 | Disposition: A | Discharge status: Home / Self Care | Payer: Managed Care, Other (non HMO) | Source: Ambulatory Visit | Attending: Family Medicine | Admitting: Family Medicine

## 2018-02-13 ENCOUNTER — Emergency Department
Admission: EM | Admit: 2018-02-13 | Discharge: 2018-02-13 | Disposition: A | Discharge status: Home / Self Care | Payer: Managed Care, Other (non HMO) | Attending: Emergency Medicine | Admitting: Emergency Medicine

## 2018-02-13 ENCOUNTER — Encounter: Payer: Self-pay | Admitting: Emergency Medicine

## 2018-02-13 ENCOUNTER — Other Ambulatory Visit: Payer: Self-pay

## 2018-02-13 ENCOUNTER — Encounter: Payer: Self-pay | Admitting: Family Medicine

## 2018-02-13 ENCOUNTER — Ambulatory Visit (INDEPENDENT_AMBULATORY_CARE_PROVIDER_SITE_OTHER): Payer: Managed Care, Other (non HMO) | Admitting: Family Medicine

## 2018-02-13 VITALS — BP 142/78 | HR 60 | Temp 99.0°F | Resp 16 | Wt 178.0 lb

## 2018-02-13 DIAGNOSIS — N23 Unspecified renal colic: Secondary | ICD-10-CM

## 2018-02-13 DIAGNOSIS — R1031 Right lower quadrant pain: Secondary | ICD-10-CM

## 2018-02-13 DIAGNOSIS — N132 Hydronephrosis with renal and ureteral calculous obstruction: Secondary | ICD-10-CM | POA: Insufficient documentation

## 2018-02-13 DIAGNOSIS — I10 Essential (primary) hypertension: Secondary | ICD-10-CM | POA: Diagnosis not present

## 2018-02-13 DIAGNOSIS — R109 Unspecified abdominal pain: Secondary | ICD-10-CM | POA: Diagnosis not present

## 2018-02-13 DIAGNOSIS — I700 Atherosclerosis of aorta: Secondary | ICD-10-CM | POA: Insufficient documentation

## 2018-02-13 DIAGNOSIS — Z87442 Personal history of urinary calculi: Secondary | ICD-10-CM

## 2018-02-13 DIAGNOSIS — N400 Enlarged prostate without lower urinary tract symptoms: Secondary | ICD-10-CM

## 2018-02-13 LAB — CBC WITH DIFFERENTIAL/PLATELET
BASOS PCT: 0 %
Basophils Absolute: 0 10*3/uL (ref 0–0.1)
EOS ABS: 0 10*3/uL (ref 0–0.7)
Eosinophils Relative: 0 %
HCT: 47.7 % (ref 40.0–52.0)
HEMOGLOBIN: 16.4 g/dL (ref 13.0–18.0)
Lymphocytes Relative: 4 %
Lymphs Abs: 0.6 10*3/uL — ABNORMAL LOW (ref 1.0–3.6)
MCH: 28.6 pg (ref 26.0–34.0)
MCHC: 34.5 g/dL (ref 32.0–36.0)
MCV: 83 fL (ref 80.0–100.0)
MONO ABS: 0.4 10*3/uL (ref 0.2–1.0)
MONOS PCT: 3 %
NEUTROS PCT: 93 %
Neutro Abs: 15 10*3/uL — ABNORMAL HIGH (ref 1.4–6.5)
Platelets: 220 10*3/uL (ref 150–440)
RBC: 5.74 MIL/uL (ref 4.40–5.90)
RDW: 13.9 % (ref 11.5–14.5)
WBC: 16.1 10*3/uL — ABNORMAL HIGH (ref 3.8–10.6)

## 2018-02-13 LAB — COMPREHENSIVE METABOLIC PANEL
ALBUMIN: 4.3 g/dL (ref 3.5–5.0)
ALT: 30 U/L (ref 0–44)
ANION GAP: 5 (ref 5–15)
AST: 36 U/L (ref 15–41)
Alkaline Phosphatase: 110 U/L (ref 38–126)
BUN: 19 mg/dL (ref 8–23)
CO2: 28 mmol/L (ref 22–32)
Calcium: 9.3 mg/dL (ref 8.9–10.3)
Chloride: 105 mmol/L (ref 98–111)
Creatinine, Ser: 1.36 mg/dL — ABNORMAL HIGH (ref 0.61–1.24)
GFR calc Af Amer: >60 mL/min (ref >60)
GFR calc non Af Amer: 55 mL/min — ABNORMAL LOW (ref >60)
GLUCOSE: 152 mg/dL — AB (ref 70–99)
Potassium: 4.5 mmol/L (ref 3.5–5.1)
SODIUM: 138 mmol/L (ref 135–145)
Total Bilirubin: 1.3 mg/dL — ABNORMAL HIGH (ref 0.3–1.2)
Total Protein: 7.4 g/dL (ref 6.5–8.1)

## 2018-02-13 LAB — URINALYSIS, COMPLETE (UACMP) WITH MICROSCOPIC
BACTERIA UA: NONE SEEN
BILIRUBIN URINE: NEGATIVE
Glucose, UA: NEGATIVE mg/dL
Hgb urine dipstick: NEGATIVE
KETONES UR: NEGATIVE mg/dL
LEUKOCYTES UA: NEGATIVE
Nitrite: NEGATIVE
PROTEIN: NEGATIVE mg/dL
Specific Gravity, Urine: 1.02 (ref 1.005–1.030)
pH: 6 (ref 5.0–8.0)

## 2018-02-13 LAB — POCT URINALYSIS DIPSTICK
Bilirubin, UA: NEGATIVE
Glucose, UA: NEGATIVE
KETONES UA: NEGATIVE
LEUKOCYTES UA: NEGATIVE
Nitrite, UA: NEGATIVE
PH UA: 8 (ref 5.0–8.0)
PROTEIN UA: POSITIVE — AB
Spec Grav, UA: 1.01 (ref 1.010–1.025)
UROBILINOGEN UA: 0.2 U/dL

## 2018-02-13 MED ORDER — TAMSULOSIN HCL 0.4 MG PO CAPS: 0.4000 mg | ORAL_CAPSULE | Freq: Every day | ORAL | 0 refills | Status: DC

## 2018-02-13 MED ORDER — KETOROLAC TROMETHAMINE 60 MG/2ML IM SOLN
60.0000 mg | Freq: Once | INTRAMUSCULAR | Status: AC
  Administered 2018-02-13: 60 mg via INTRAMUSCULAR

## 2018-02-13 MED ORDER — ONDANSETRON HCL 4 MG PO TABS: 4.0000 mg | ORAL_TABLET | Freq: Three times a day (TID) | ORAL | 0 refills | Status: DC | PRN

## 2018-02-13 MED ORDER — HYDROCODONE-ACETAMINOPHEN 5-325 MG PO TABS: 1.0000 | ORAL_TABLET | ORAL | 0 refills | Status: AC | PRN

## 2018-02-13 MED ORDER — PROMETHAZINE HCL 25 MG/ML IJ SOLN
25.0000 mg | Freq: Once | INTRAMUSCULAR | Status: AC
  Administered 2018-02-13: 25 mg via INTRAMUSCULAR

## 2018-02-13 NOTE — Patient Instructions (Signed)

## 2018-02-13 NOTE — ED Provider Notes (Addendum)
[facility] Emergency Department Provider Note       Time seen: ----------------------------------------- 7:35 PM on 02/13/2018 -----------------------------------------   I have reviewed the triage vital signs and the nursing notes.  HISTORY   Chief Complaint Flank Pain    HPI Shawn Sherman is a 62 y.o. male with a history of arthritis, hypertension, GERD, renal colic who presents to the ED for right flank pain radiating into the right side of his abdomen for the past 2 days accompanied by nausea.  He does have a history of kidney stones, was sent over by [facility] for follow-up and received an injection with complete relief.  Patient denies any complaints at this time, currently states he is feeling better.  Past Medical History:  Diagnosis Date  . Arthritis   . Essential hypertension 03/29/2017  . GERD (gastroesophageal reflux disease)   . History of kidney stones     Patient Active Problem List   Diagnosis Date Noted  . Essential hypertension 03/29/2017  . Ureterolithiasis 03/07/2017  . History of hay fever 06/17/2015  . Acid reflux 06/17/2015  . Hyperlipidemia 06/17/2015    Past Surgical History:  Procedure Laterality Date  . ANTERIOR LAT LUMBAR FUSION Right 01/01/2013   Procedure: lumbar three-four extreme lateral interbody fusion;  Surgeon: Reinaldo Meeker, MD;  Location: [facility];  Service: Neurosurgery;  Laterality: Right;  Lumbar three-four extreme lateral interbody fusion    . BACK SURGERY  08  . ESOPHAGOGASTRODUODENOSCOPY (EGD) WITH PROPOFOL N/A 07/05/2015   Procedure: ESOPHAGOGASTRODUODENOSCOPY (EGD) WITH PROPOFOL;  Surgeon: Midge Minium, MD;  Location: [facility] ENDOSCOPY;  Service: Endoscopy;  Laterality: N/A;  . LUMBAR PERCUTANEOUS PEDICLE SCREW 1 LEVEL N/A 01/01/2013   Procedure: lumbar three-four percutaneous pedicle screws ;  Surgeon: Reinaldo Meeker, MD;  Location: [facility];  Service: Neurosurgery;  Laterality:  N/A;  lumbar three-four percutaneous pedicle screws    Allergies Patient has no known allergies.  Social History Social History   Tobacco Use  . Smoking status: Never Smoker  . Smokeless tobacco: Current User    Types: Snuff  Substance Use Topics  . Alcohol use: No  . Drug use: No   Review of Systems Constitutional: Negative for fever. Cardiovascular: Negative for chest pain. Respiratory: Negative for shortness of breath. Gastrointestinal: Positive for flank pain, recent vomiting Genitourinary: Negative for dysuria. Musculoskeletal: Positive for right-sided back pain Skin: Negative for rash. Neurological: Negative for headaches, focal weakness or numbness.  All systems negative/normal/unremarkable except as stated in the HPI  ____________________________________________   PHYSICAL EXAM:  VITAL SIGNS: ED Triage Vitals  Enc Vitals Group     BP --      Pulse --      Resp --      Temp --      Temp src --      SpO2 --      Weight 02/13/18 1916 177 lb 14.6 oz (80.7 kg)     Height 02/13/18 1916 5\' 10"  (1.778 m)     Head Circumference --      Peak Flow --      Pain Score 02/13/18 1915 0     Pain Loc --      Pain Edu? --      Excl. in GC? --    Constitutional: Alert and oriented. Well appearing and in no distress. Eyes: Conjunctivae are normal. Normal extraocular movements. ENT   Head: Normocephalic and atraumatic.   Nose: No congestion/rhinnorhea.  Mouth/Throat: Mucous membranes are moist.   Neck: No stridor. Cardiovascular: Normal rate, regular rhythm. No murmurs, rubs, or gallops. Respiratory: Normal respiratory effort without tachypnea nor retractions. Breath sounds are clear and equal bilaterally. No wheezes/rales/rhonchi. Gastrointestinal: Mild right flank tenderness, no rebound or guarding.  Normal bowel sounds. Musculoskeletal: Nontender with normal range of motion in extremities. No lower extremity tenderness nor edema. Neurologic:  Normal  speech and language. No gross focal neurologic deficits are appreciated.  Skin:  Skin is warm, dry and intact. No rash noted. ____________________________________________  ED COURSE:  As part of my medical decision making, I reviewed the following data within the electronic MEDICAL RECORD NUMBER History obtained from family if available, nursing notes, old chart and ekg, as well as notes from prior ED visits. Patient presented for right flank pain, we will assess with labs and imaging as indicated at this time.   Procedures ____________________________________________   LABS (pertinent positives/negatives)  Labs Reviewed  CBC WITH DIFFERENTIAL/PLATELET - Abnormal; Notable for the following components:      Result Value   WBC 16.1 (*)    Neutro Abs 15.0 (*)    Lymphs Abs 0.6 (*)    All other components within normal limits  COMPREHENSIVE METABOLIC PANEL - Abnormal; Notable for the following components:   Glucose, Bld 152 (*)    Creatinine, Ser 1.36 (*)    Total Bilirubin 1.3 (*)    GFR calc non Af Amer 55 (*)    All other components within normal limits  URINALYSIS, COMPLETE (UACMP) WITH MICROSCOPIC - Abnormal; Notable for the following components:   Color, Urine YELLOW (*)    APPearance CLEAR (*)    All other components within normal limits  URINE CULTURE    RADIOLOGY Images were viewed by me  CT renal protocol IMPRESSION: 1. Obstructing 4 mm stone within the proximal RIGHT ureter causing moderate RIGHT-sided hydronephrosis and perinephric inflammation. 2. Bilateral nephrolithiasis, as detailed above. 3. Aortic atherosclerosis. 4. Prostate gland is enlarged. Consider correlation with PSA lab values and/or physical exam findings. ____________________________________________  DIFFERENTIAL DIAGNOSIS   Renal colic, UTI, pyelonephritis, muscle strain  FINAL ASSESSMENT AND PLAN  Renal colic   Plan: The patient had presented for right flank pain. Patient's labs were  unremarkable. Patient's imaging did reveal 4 mm proximal right ureteral stone.  Will be placed on Flomax, given pain medicine and antiemetics.  Case was discussed with Dr. Mena GoesEskridge agrees with plan and discharge.   Ulice DashJohnathan E Markis Langland, MD   Note: This note was generated in part or whole with voice recognition software. Voice recognition is usually quite accurate but there are transcription errors that can and very often do occur. I apologize for any typographical errors that were not detected and corrected.     Emily FilbertWilliams, Adreanna Fickel E, MD 02/13/18 Nolen [id number]    Emily FilbertWilliams, Yecenia Dalgleish E, MD 02/13/18 2028    Emily FilbertWilliams, Moustapha Tooker E, MD 02/13/18 2041

## 2018-02-13 NOTE — ED Triage Notes (Addendum)
Patient ambulatory to triage with steady gait, without difficulty or distress noted; pt reports right flank pain radiating into abd x 2 days accomp by nausea; st hx of same with kidney stone; [facility] and received an injection with complete relief; CT scan & UA in chart

## 2018-02-13 NOTE — Progress Notes (Signed)
Patient: Shawn Sherman Male    DOB: Oct 01, 1955   62 y.o.   MRN: [id number] Visit Date: 02/13/2018  Today's Provider: Shirlee LatchAngela Emiley Digiacomo, MD   Chief Complaint  Patient presents with  . Nausea   Subjective:    HPI  Patient comes in today c/o nausea and vomiting X 2 days. He reports that this came on all of a sudden. He is unable to hold liquids and solids. He also mentions that he has had flank pain since last night. He has not taken anything for the pain due to vomiting.   He has h/o kidney stones that were obstructive and required lithotripsy in the past.  He states that this pain is very similar.       No Known Allergies   Current Outpatient Medications:  .  amLODipine (NORVASC) 10 MG tablet, Take 1 tablet (10 mg total) by mouth daily., Disp: 90 tablet, Rfl: 3 .  atorvastatin (LIPITOR) 80 MG tablet, Take 1 tablet (80 mg total) by mouth daily., Disp: 90 tablet, Rfl: 3 .  esomeprazole (NEXIUM) 40 MG capsule, One daily, Disp: 90 capsule, Rfl: 1 .  meloxicam (MOBIC) 15 MG tablet, Take 1 tablet (15 mg total) by mouth daily., Disp: 30 tablet, Rfl: 0 .  ketorolac (ACULAR) 0.5 % ophthalmic solution, Place 1 drop into the right eye 4 (four) times daily. (Patient not taking: Reported on 02/13/2018), Disp: 5 mL, Rfl: 0  Review of Systems  Constitutional: Positive for activity change and fatigue.  Gastrointestinal: Positive for abdominal pain, diarrhea, nausea and vomiting.  Genitourinary: Positive for flank pain. Negative for difficulty urinating, dysuria and frequency.  Musculoskeletal: Positive for arthralgias and myalgias.  Skin: Negative.   Neurological: Positive for headaches. Negative for dizziness.    Social History   Tobacco Use  . Smoking status: Never Smoker  . Smokeless tobacco: Current User    Types: Snuff  Substance Use Topics  . Alcohol use: No   Objective:   BP (!) 142/78 (BP Location: Right Arm, Patient Position: Sitting, Cuff Size: Normal)   Pulse 60    Temp 99 F (37.2 C)   Resp 16   Wt 178 lb (80.7 kg)   SpO2 98%   BMI 25.54 kg/m  Vitals:   02/13/18 1558  BP: (!) 142/78  Pulse: 60  Resp: 16  Temp: 99 F (37.2 C)  SpO2: 98%  Weight: 178 lb (80.7 kg)     Physical Exam  Constitutional: He is oriented to person, place, and time. He appears well-developed and well-nourished. He appears distressed.  HENT:  Head: Normocephalic and atraumatic.  Right Ear: External ear normal.  Left Ear: External ear normal.  Nose: Nose normal.  Mouth/Throat: Oropharynx is clear and moist. No oropharyngeal exudate.  Eyes: Pupils are equal, round, and reactive to light. Conjunctivae are normal.  Neck: Neck supple. No thyromegaly present.  Cardiovascular: Normal rate, regular rhythm, normal heart sounds and intact distal pulses.  No murmur heard. Pulmonary/Chest: Effort normal and breath sounds normal. No respiratory distress. He has no wheezes. He has no rales.  Abdominal: Soft. Bowel sounds are normal. He exhibits no distension. There is tenderness in the right upper quadrant, right lower quadrant, periumbilical area and suprapubic area. There is CVA tenderness. There is no rigidity, no rebound, no guarding and negative Murphy's sign.  Musculoskeletal: He exhibits no edema or deformity.  Lymphadenopathy:    He has no cervical adenopathy.  Neurological: He is alert and oriented  to person, place, and time.  Skin: Skin is warm and dry. Capillary refill takes less than 2 seconds. No rash noted.  Psychiatric: He has a normal mood and affect. His behavior is normal.  Vitals reviewed.   Results for orders placed or performed in visit on 02/13/18  POCT urinalysis dipstick  Result Value Ref Range   Color, UA yellow    Clarity, UA clear    Glucose, UA Negative Negative   Bilirubin, UA negative    Ketones, UA negative    Spec Grav, UA 1.010 1.010 - 1.025   Blood, UA non hemolyzed trace    pH, UA 8.0 5.0 - 8.0   Protein, UA Positive (A) Negative    Urobilinogen, UA 0.2 0.2 or 1.0 E.U./dL   Nitrite, UA negative    Leukocytes, UA Negative Negative       Assessment & Plan:   1. Right flank pain 2. History of nephrolithiasis 3. Right lower quadrant abdominal pain - concern for possible nephrolithiasis - as he is in significant pain and has h/o obstructive stones will obtain STAT CT Renal stone study - given IM Toradol and Phenergan prior to leaving clinic - can use zofran and norco prn - start flomax daily - no signs of infection on UA - return precautions discussed - CT RENAL STONE STUDY; Future    Meds ordered this encounter  Medications  . HYDROcodone-acetaminophen (NORCO/VICODIN) 5-325 MG tablet    Sig: Take 1 tablet by mouth every 4 (four) hours as needed for up to 5 days for moderate pain or severe pain.    Dispense:  30 tablet    Refill:  0  . tamsulosin (FLOMAX) 0.4 MG CAPS capsule    Sig: Take 1 capsule (0.4 mg total) by mouth daily.    Dispense:  30 capsule    Refill:  0  . ondansetron (ZOFRAN) 4 MG tablet    Sig: Take 1 tablet (4 mg total) by mouth every 8 (eight) hours as needed for nausea or vomiting.    Dispense:  20 tablet    Refill:  0  . promethazine (PHENERGAN) injection 25 mg  . ketorolac (TORADOL) injection 60 mg     Return if symptoms worsen or fail to improve.   The entirety of the information documented in the History of Present Illness, Review of Systems and Physical Exam were personally obtained by me. Portions of this information were initially documented by Anson [place], CMA and reviewed by me for thoroughness and accuracy.    Erasmo Downer, MD, MPH [facility] 02/13/2018 4:51 PM

## 2018-02-15 LAB — URINE CULTURE: SPECIAL REQUESTS: NORMAL

## 2018-02-17 ENCOUNTER — Telehealth: Payer: Self-pay | Admitting: Family Medicine

## 2018-02-17 NOTE — Telephone Encounter (Signed)
Work note to be out until 02/20/18 will be at front desk to pick up.  Did he f/u with Urology?  Erasmo DownerBacigalupo, Angela M, MD, MPH [facility] 02/17/2018 3:52 PM

## 2018-02-17 NOTE — Telephone Encounter (Signed)
Pt advised. He states he was waiting to FU with uro in case he started feeling better. Advised him to schedule appointment.

## 2018-02-17 NOTE — Telephone Encounter (Signed)
Please review

## 2018-02-17 NOTE — Telephone Encounter (Signed)
Pt was in on Thursday with Kidney stones.  He said he is still not feeling well and wants to know if you can write him a note to be out of work several more days.  He did pass one stone but still not feeling well  Pt's call back is [phone]  Thanks teri

## 2018-03-14 ENCOUNTER — Telehealth: Payer: Self-pay | Admitting: Family Medicine

## 2018-03-14 ENCOUNTER — Other Ambulatory Visit: Payer: Self-pay | Admitting: Family Medicine

## 2018-03-14 DIAGNOSIS — E782 Mixed hyperlipidemia: Secondary | ICD-10-CM

## 2018-03-14 MED ORDER — ATORVASTATIN CALCIUM 80 MG PO TABS: 80.0000 mg | ORAL_TABLET | Freq: Every day | ORAL | 3 refills | Status: DC

## 2018-03-14 NOTE — Telephone Encounter (Signed)
done

## 2018-03-14 NOTE — Telephone Encounter (Signed)
Last office visit 04/19/17, last filled 03/08/17.KW

## 2018-03-14 NOTE — Telephone Encounter (Signed)
[facility] faxed refill request for the following medications:  atorvastatin (LIPITOR) 80 MG tablet  Qty 90  Please advise.

## 2018-04-14 ENCOUNTER — Telehealth: Payer: Self-pay | Admitting: Family Medicine

## 2018-04-14 ENCOUNTER — Other Ambulatory Visit: Payer: Self-pay | Admitting: Family Medicine

## 2018-04-14 DIAGNOSIS — I10 Essential (primary) hypertension: Secondary | ICD-10-CM

## 2018-04-14 MED ORDER — AMLODIPINE BESYLATE 10 MG PO TABS: 10.0000 mg | ORAL_TABLET | Freq: Every day | ORAL | 3 refills | Status: DC

## 2018-04-14 NOTE — Telephone Encounter (Signed)
[facility] faxed refill request for the following medications:  amLODipine (NORVASC) 10 MG tablet   Qty: 90  Please advise.

## 2018-04-14 NOTE — Telephone Encounter (Signed)
Refills done.

## 2018-04-14 NOTE — Telephone Encounter (Signed)
Medication was last filled 03/29/17, last office visit and Lipid Panel drawn 04/19/17. KW

## 2018-07-21 ENCOUNTER — Other Ambulatory Visit: Payer: Self-pay | Admitting: Family Medicine

## 2018-07-21 DIAGNOSIS — K209 Esophagitis, unspecified without bleeding: Secondary | ICD-10-CM

## 2018-07-21 MED ORDER — ESOMEPRAZOLE MAGNESIUM 40 MG PO CPDR: DELAYED_RELEASE_CAPSULE | ORAL | 1 refills | Status: DC

## 2018-07-21 NOTE — Telephone Encounter (Signed)
Last o.v. was 02/13/2018, please advise.

## 2018-07-21 NOTE — Telephone Encounter (Signed)
[facility] faxed refill request for the following medications:  esomeprazole (NEXIUM) 40 MG capsule  90 day supply  Please advise. Thanks TNP

## 2018-08-05 ENCOUNTER — Emergency Department
Admission: EM | Admit: 2018-08-05 | Discharge: 2018-08-05 | Disposition: A | Discharge status: Home / Self Care | Payer: 59 | Attending: Student in an Organized Health Care Education/Training Program | Admitting: Student in an Organized Health Care Education/Training Program

## 2018-08-05 ENCOUNTER — Emergency Department: Payer: 59

## 2018-08-05 ENCOUNTER — Other Ambulatory Visit: Payer: Self-pay

## 2018-08-05 DIAGNOSIS — R079 Chest pain, unspecified: Secondary | ICD-10-CM | POA: Diagnosis present

## 2018-08-05 DIAGNOSIS — I10 Essential (primary) hypertension: Secondary | ICD-10-CM | POA: Insufficient documentation

## 2018-08-05 DIAGNOSIS — Z79899 Other long term (current) drug therapy: Secondary | ICD-10-CM | POA: Insufficient documentation

## 2018-08-05 DIAGNOSIS — R0789 Other chest pain: Secondary | ICD-10-CM

## 2018-08-05 LAB — BASIC METABOLIC PANEL
Anion gap: 8 (ref 5–15)
BUN: 13 mg/dL (ref 8–23)
CHLORIDE: 103 mmol/L (ref 98–111)
CO2: 25 mmol/L (ref 22–32)
Calcium: 9.1 mg/dL (ref 8.9–10.3)
Creatinine, Ser: 0.79 mg/dL (ref 0.61–1.24)
GFR calc non Af Amer: >60 mL/min (ref >60)
Glucose, Bld: 147 mg/dL — ABNORMAL HIGH (ref 70–99)
POTASSIUM: 4.1 mmol/L (ref 3.5–5.1)
SODIUM: 136 mmol/L (ref 135–145)

## 2018-08-05 LAB — FIBRIN DERIVATIVES D-DIMER (ARMC ONLY): Fibrin derivatives D-dimer (ARMC): 288.75 ng/mL (FEU) (ref 0.00–499.00)

## 2018-08-05 LAB — CBC
HCT: 48.2 % (ref 39.0–52.0)
Hemoglobin: 16 g/dL (ref 13.0–17.0)
MCH: 27.9 pg (ref 26.0–34.0)
MCHC: 33.2 g/dL (ref 30.0–36.0)
MCV: 84.1 fL (ref 80.0–100.0)
PLATELETS: 195 10*3/uL (ref 150–400)
RBC: 5.73 MIL/uL (ref 4.22–5.81)
RDW: 13.4 % (ref 11.5–15.5)
WBC: 7.8 10*3/uL (ref 4.0–10.5)
nRBC: 0 % (ref 0.0–0.2)

## 2018-08-05 LAB — TROPONIN I: Troponin I: <0.03 ng/mL (ref <0.03)

## 2018-08-05 MED ORDER — HYDROCODONE-ACETAMINOPHEN 5-325 MG PO TABS: 1.0000 | ORAL_TABLET | Freq: Once | ORAL | Status: DC

## 2018-08-05 MED ORDER — LIDOCAINE 5 % EX PTCH: 1.0000 | MEDICATED_PATCH | CUTANEOUS | Status: DC

## 2018-08-05 MED ORDER — SODIUM CHLORIDE 0.9% FLUSH
3.0000 mL | Freq: Once | INTRAVENOUS | Status: AC
  Administered 2018-08-05: 3 mL via INTRAVENOUS

## 2018-08-05 NOTE — ED Triage Notes (Signed)
Pt c/o substernal chest pain, radiates into the jaw with nausea that started yesterday, states better today . Pt is in NAD at present. Smiling and making jokes in triage.Marland Kitchen

## 2018-08-05 NOTE — ED Provider Notes (Signed)
[facility] Emergency Department Provider Note    First MD Initiated Contact with Patient 08/05/18 1710     (approximate)  I have reviewed the triage vital signs and the nursing notes.   HISTORY  Chief Complaint Chest Pain    HPI Shawn Sherman is a 63 y.o. male below listed past medical history presents the ER for evaluation of midsternal nonradiating chest pain that started yesterday evening after the patient was at work doing stretches.  He does work as a Designer, fashion/clothingroofer.  Denies any diaphoresis nausea or shortness of breath but does have pain with deep inspiration.  Does have a history of bronchitis but feels that the symptoms have otherwise improved.  Denies any fevers.  No nausea or vomiting.  Is never had pain like this before.    Past Medical History:  Diagnosis Date  . Arthritis   . Essential hypertension 03/29/2017  . GERD (gastroesophageal reflux disease)   . History of kidney stones    No family history on file. Past Surgical History:  Procedure Laterality Date  . ANTERIOR LAT LUMBAR FUSION Right 01/01/2013   Procedure: lumbar three-four extreme lateral interbody fusion;  Surgeon: Reinaldo Meekerandy O Kritzer, MD;  Location: [facility];  Service: Neurosurgery;  Laterality: Right;  Lumbar three-four extreme lateral interbody fusion    . BACK SURGERY  08  . ESOPHAGOGASTRODUODENOSCOPY (EGD) WITH PROPOFOL N/A 07/05/2015   Procedure: ESOPHAGOGASTRODUODENOSCOPY (EGD) WITH PROPOFOL;  Surgeon: Midge Miniumarren Wohl, MD;  Location: [facility] ENDOSCOPY;  Service: Endoscopy;  Laterality: N/A;  . LUMBAR PERCUTANEOUS PEDICLE SCREW 1 LEVEL N/A 01/01/2013   Procedure: lumbar three-four percutaneous pedicle screws ;  Surgeon: Reinaldo Meekerandy O Kritzer, MD;  Location: [facility];  Service: Neurosurgery;  Laterality: N/A;  lumbar three-four percutaneous pedicle screws   Patient Active Problem List   Diagnosis Date Noted  . Essential hypertension 03/29/2017  . Ureterolithiasis 03/07/2017  . History of  hay fever 06/17/2015  . Acid reflux 06/17/2015  . Hyperlipidemia 06/17/2015      Prior to Admission medications   Medication Sig Start Date End Date Taking? Authorizing Provider  amLODipine (NORVASC) 10 MG tablet Take 1 tablet (10 mg total) by mouth daily. 04/14/18   Anola Gurneyhauvin, Robert, PA  atorvastatin (LIPITOR) 80 MG tablet Take 1 tablet (80 mg total) by mouth daily. 03/14/18   Anola Gurneyhauvin, Robert, PA  esomeprazole (NEXIUM) 40 MG capsule One daily 07/21/18   Anola Gurneyhauvin, Robert, PA  ketorolac (ACULAR) 0.5 % ophthalmic solution Place 1 drop into the right eye 4 (four) times daily. Patient not taking: Reported on 02/13/2018 04/19/17   Anola Gurneyhauvin, Robert, PA  meloxicam (MOBIC) 15 MG tablet Take 1 tablet (15 mg total) by mouth daily. 01/07/18   Anola Gurneyhauvin, Robert, PA  ondansetron (ZOFRAN) 4 MG tablet Take 1 tablet (4 mg total) by mouth every 8 (eight) hours as needed for nausea or vomiting. 02/13/18   Erasmo DownerBacigalupo, Angela M, MD  tamsulosin (FLOMAX) 0.4 MG CAPS capsule Take 1 capsule (0.4 mg total) by mouth daily. 02/13/18   Erasmo DownerBacigalupo, Angela M, MD    Allergies Patient has no known allergies.    Social History Social History   Tobacco Use  . Smoking status: Never Smoker  . Smokeless tobacco: Current User    Types: Snuff  Substance Use Topics  . Alcohol use: No  . Drug use: No    Review of Systems Patient denies headaches, rhinorrhea, blurry vision, numbness, shortness of breath, chest pain, edema, cough, abdominal pain, nausea, vomiting, diarrhea,  dysuria, fevers, rashes or hallucinations unless otherwise stated above in HPI. ____________________________________________   PHYSICAL EXAM:  VITAL SIGNS: Vitals:   08/05/18 1734 08/05/18 1830  BP:  131/83  Pulse: 60 (!) 58  Resp: 11 12  Temp:    SpO2: 95% 96%    Constitutional: Alert and oriented.  Eyes: Conjunctivae are normal.  Head: Atraumatic. Nose: No congestion/rhinnorhea. Mouth/Throat: Mucous membranes are moist.   Neck: No stridor.  Painless ROM.  Cardiovascular: Normal rate, regular rhythm. Grossly normal heart sounds.  Good peripheral circulation. Respiratory: Normal respiratory effort.  No retractions. Lungs CTAB. Gastrointestinal: Soft and nontender. No distention. No abdominal bruits. No CVA tenderness. Genitourinary:  Musculoskeletal: No lower extremity tenderness nor edema.  No joint effusions. Neurologic:  Normal speech and language. No gross focal neurologic deficits are appreciated. No facial droop Skin:  Skin is warm, dry and intact. No rash noted. Psychiatric: Mood and affect are normal. Speech and behavior are normal.  ____________________________________________   LABS (all labs ordered are listed, but only abnormal results are displayed)  Results for orders placed or performed during the hospital encounter of 08/05/18 (from the past 24 hour(s))  Basic metabolic panel     Status: Abnormal   Collection Time: 08/05/18  3:36 PM  Result Value Ref Range   Sodium 136 135 - 145 mmol/L   Potassium 4.1 3.5 - 5.1 mmol/L   Chloride 103 98 - 111 mmol/L   CO2 25 22 - 32 mmol/L   Glucose, Bld 147 (H) 70 - 99 mg/dL   BUN 13 8 - 23 mg/dL   Creatinine, Ser 3.15 0.61 - 1.24 mg/dL   Calcium 9.1 8.9 - 40.0 mg/dL   GFR calc non Af Amer >60 >60 mL/min   GFR calc Af Amer >60 >60 mL/min   Anion gap 8 5 - 15  CBC     Status: None   Collection Time: 08/05/18  3:36 PM  Result Value Ref Range   WBC 7.8 4.0 - 10.5 K/uL   RBC 5.73 4.22 - 5.81 MIL/uL   Hemoglobin 16.0 13.0 - 17.0 g/dL   HCT 86.7 61.9 - 50.9 %   MCV 84.1 80.0 - 100.0 fL   MCH 27.9 26.0 - 34.0 pg   MCHC 33.2 30.0 - 36.0 g/dL   RDW 32.6 71.2 - 45.8 %   Platelets 195 150 - 400 K/uL   nRBC 0.0 0.0 - 0.2 %  Troponin I - ONCE - STAT     Status: None   Collection Time: 08/05/18  3:36 PM  Result Value Ref Range   Troponin I <0.03 <0.03 ng/mL  Fibrin derivatives D-Dimer ([facility] only)     Status: None   Collection Time: 08/05/18  5:59 PM  Result Value Ref  Range   Fibrin derivatives D-dimer ([facility]) 288.75 0.00 - 499.00 ng/mL (FEU)  Troponin I - Once-Timed     Status: None   Collection Time: 08/05/18  6:29 PM  Result Value Ref Range   Troponin I <0.03 <0.03 ng/mL   ____________________________________________  EKG My review and personal interpretation at Time: 15:23   Indication: chest pain  Rate: 90  Rhythm: sinsu Axis: normal Other: normal intervals, no stemi, nonspecific st abn similar to previous tracing ____________________________________________  RADIOLOGY  I personally reviewed all radiographic images ordered to evaluate for the above acute complaints and reviewed radiology reports and findings.  These findings were personally discussed with the patient.  Please see medical record for radiology report.  ____________________________________________  PROCEDURES  Procedure(s) performed:  Procedures    Critical Care performed: no ____________________________________________   INITIAL IMPRESSION / ASSESSMENT AND PLAN / ED COURSE  Pertinent labs & imaging results that were available during my care of the patient were reviewed by me and considered in my medical decision making (see chart for details).   DDX: ACS, pericarditis, esophagitis, boerhaaves, pe, dissection, pna, bronchitis, costochondritis   Shawn Sherman is a 63 y.o. who presents to the ED with chest pain as described above.  Seems primarily musculoskeletal in nature given the onset of pain after doing stretches after work.  A chest x-ray shows no evidence of pneumothorax or consolidation.  EKG shows no significant changes from previous.  Given his age and risk factors will order serial enzymes to exclude ACS but would anticipate some sort of troponin elevation at this point now almost 24 hours after onset of pain.  Will order d-dimer to further re-stratify as the patient is low risk by Wells criteria.  Clinical Course as of Aug 05 1898  Tue Aug 05, 2018  1858  Repeat troponin is negative.  This does not seem clinically consistent with ACS.  D-dimer is negative.  Seems most likely musculoskeletal strain.  Patient well-appearing otherwise in no acute distress.  Stable and appropriate for outpatient follow-up.  Have discussed with the patient and available family all diagnostics and treatments performed thus far and all questions were answered to the best of my ability. The patient demonstrates understanding and agreement with plan.    [PR]    Clinical Course User Index [PR] Willy Eddyobinson, Meyah Corle, MD     As part of my medical decision making, I reviewed the following data within the electronic MEDICAL RECORD NUMBER Nursing notes reviewed and incorporated, Labs reviewed, notes from prior ED visits and [place] Controlled Substance Database   ____________________________________________   FINAL CLINICAL IMPRESSION(S) / ED DIAGNOSES  Final diagnoses:  Atypical chest pain      NEW MEDICATIONS STARTED DURING THIS VISIT:  New Prescriptions   No medications on file     Note:  This document was prepared using Dragon voice recognition software and may include unintentional dictation errors.    Willy Eddyobinson, Ramello Cordial, MD 08/05/18 1900

## 2018-08-09 DIAGNOSIS — I209 Angina pectoris, unspecified: Secondary | ICD-10-CM

## 2018-08-09 DIAGNOSIS — R0989 Other specified symptoms and signs involving the circulatory and respiratory systems: Secondary | ICD-10-CM

## 2018-08-09 HISTORY — DX: Angina pectoris, unspecified: I20.9

## 2018-08-09 HISTORY — DX: Other specified symptoms and signs involving the circulatory and respiratory systems: R09.89

## 2018-08-22 ENCOUNTER — Ambulatory Visit
Admission: RE | Admit: 2018-08-22 | Discharge: 2018-08-22 | Disposition: A | Discharge status: Home / Self Care | Payer: 59 | Source: Ambulatory Visit | Attending: Urology | Admitting: Urology

## 2018-08-22 ENCOUNTER — Ambulatory Visit (INDEPENDENT_AMBULATORY_CARE_PROVIDER_SITE_OTHER): Payer: 59 | Admitting: Urology

## 2018-08-22 ENCOUNTER — Encounter: Payer: Self-pay | Admitting: Urology

## 2018-08-22 VITALS — BP 142/73 | HR 74 | Ht 70.0 in | Wt 174.6 lb

## 2018-08-22 DIAGNOSIS — N200 Calculus of kidney: Secondary | ICD-10-CM

## 2018-08-22 NOTE — H&P (View-Only) (Signed)
08/22/2018 3:11 PM   Shawn Sherman 1955/12/17 [id number]  Referring provider: Anola Gurney, PA [address] [place], [place] [address]  Chief Complaint  Patient presents with  . Nephrolithiasis    HPI: Shawn Sherman is a 63 year old male seen for evaluation of nephrolithiasis.  He presented to the ED on 02/13/2018 with acute onset of right flank pain radiating to the right lower quadrant.  There were no identifiable precipitating, aggravating or alleviating factors.  He had nausea without vomiting.  Denied fever or chills. He was initially seen by his primary provider and sent to the ED for pain control.  A stone protocol CT of the abdomen and pelvis was performed which showed a 4 mm right proximal ureteral calculus with moderate hydronephrosis.  He was also noted to have bilateral nephrolithiasis with 4 right renal calculi measuring from 1-7 mm in size and 2 left renal calculi measuring 1 and 5 mm.  He subsequently passed the right renal calculus which he brings in today.  He is presently asymptomatic.  He has a prior history of ureteroscopic stone removal and shockwave lithotripsy.  He states prior stone analysis have been calcium oxalate.  He has not had a metabolic evaluation to his knowledge.  PMH: Past Medical History:  Diagnosis Date  . Arthritis   . Essential hypertension 03/29/2017  . GERD (gastroesophageal reflux disease)   . History of kidney stones     Surgical History: Past Surgical History:  Procedure Laterality Date  . ANTERIOR LAT LUMBAR FUSION Right 01/01/2013   Procedure: lumbar three-four extreme lateral interbody fusion;  Surgeon: Reinaldo Meeker, MD;  Location: [facility];  Service: Neurosurgery;  Laterality: Right;  Lumbar three-four extreme lateral interbody fusion    . BACK SURGERY  08  . ESOPHAGOGASTRODUODENOSCOPY (EGD) WITH PROPOFOL N/A 07/05/2015   Procedure: ESOPHAGOGASTRODUODENOSCOPY (EGD) WITH PROPOFOL;  Surgeon: Midge Minium, MD;   Location: [facility];  Service: Endoscopy;  Laterality: N/A;  . LUMBAR PERCUTANEOUS PEDICLE SCREW 1 LEVEL N/A 01/01/2013   Procedure: lumbar three-four percutaneous pedicle screws ;  Surgeon: Reinaldo Meeker, MD;  Location: [facility];  Service: Neurosurgery;  Laterality: N/A;  lumbar three-four percutaneous pedicle screws    Home Medications:  Allergies as of 08/22/2018   No Known Allergies     Medication List       Accurate as of August 22, 2018  3:11 PM. Always use your most recent med list.        amLODipine 10 MG tablet Commonly known as:  NORVASC Take 1 tablet (10 mg total) by mouth daily.   atorvastatin 80 MG tablet Commonly known as:  LIPITOR Take 1 tablet (80 mg total) by mouth daily.   esomeprazole 40 MG capsule Commonly known as:  NEXIUM One daily   ketorolac 0.5 % ophthalmic solution Commonly known as:  ACULAR Place 1 drop into the right eye 4 (four) times daily.   meloxicam 15 MG tablet Commonly known as:  MOBIC Take 1 tablet (15 mg total) by mouth daily.   tamsulosin 0.4 MG Caps capsule Commonly known as:  FLOMAX Take 1 capsule (0.4 mg total) by mouth daily.       Allergies: No Known Allergies  Family History: No family history on file.  Social History:  reports that he has never smoked. His smokeless tobacco use includes snuff. He reports that he does not drink alcohol or use drugs.  ROS: UROLOGY Frequent Urination?: No Hard to postpone urination?: No  Burning/pain with urination?: No Get up at night to urinate?: No Leakage of urine?: No Urine stream starts and stops?: No Trouble starting stream?: No Do you have to strain to urinate?: No Blood in urine?: No Urinary tract infection?: No Sexually transmitted disease?: No Injury to kidneys or bladder?: No Painful intercourse?: No Weak stream?: No Erection problems?: No Penile pain?: No  Gastrointestinal Nausea?: No Vomiting?: No Indigestion/heartburn?: No Diarrhea?:  No Constipation?: No  Constitutional Fever: No Night sweats?: No Weight loss?: No Fatigue?: No  Skin Skin rash/lesions?: No Itching?: No  Eyes Blurred vision?: No Double vision?: No  Ears/Nose/Throat Sore throat?: No Sinus problems?: No  Hematologic/Lymphatic Swollen glands?: No Easy bruising?: No  Cardiovascular Leg swelling?: No Chest pain?: No  Respiratory Cough?: No Shortness of breath?: No  Endocrine Excessive thirst?: No  Musculoskeletal Back pain?: Yes Joint pain?: Yes  Neurological Headaches?: No Dizziness?: No  Psychologic Depression?: No Anxiety?: No  Physical Exam: BP (!) 142/73 (BP Location: Left Arm, Patient Position: Sitting, Cuff Size: Normal)   Pulse 74   Ht 5\' 10"  (1.778 m)   Wt 174 lb 9.6 oz (79.2 kg)   BMI 25.05 kg/m   Constitutional:  Alert and oriented, No acute distress. HEENT: [place] AT, moist mucus membranes.  Trachea midline, no masses. Cardiovascular: No clubbing, cyanosis, or edema.  Clear Respiratory: Normal respiratory effort, no increased work of breathing.  RRR GI: Abdomen is soft, nontender, nondistended, no abdominal masses GU: No CVA tenderness Lymph: No cervical or inguinal lymphadenopathy. Skin: No rashes, bruises or suspicious lesions. Neurologic: Grossly intact, no focal deficits, moving all 4 extremities. Psychiatric: Normal mood and affect.   Pertinent Imaging: CT was personally reviewed  Results for orders placed during the hospital encounter of 02/13/18  CT RENAL STONE STUDY   Narrative CLINICAL DATA:  RIGHT flank pain for 3 days. History of kidney stone.  EXAM: CT ABDOMEN AND PELVIS WITHOUT CONTRAST  TECHNIQUE: Multidetector CT imaging of the abdomen and pelvis was performed following the standard protocol without IV contrast.  COMPARISON:  CT abdomen dated 11/19/2014.  FINDINGS: Lower chest: No acute abnormality.  Hepatobiliary: No focal liver abnormality is seen. No gallstones, gallbladder  wall thickening, or biliary dilatation.  Pancreas: Unremarkable. No pancreatic ductal dilatation or surrounding inflammatory changes.  Spleen: Normal in size without focal abnormality.  Adrenals/Urinary Tract: Obstructing 4 mm stone within the proximal RIGHT ureter causing moderate RIGHT-sided hydronephrosis and perinephric inflammation.  At least 4 additional RIGHT renal stones, measuring 1 to 7 mm in size. There are 2 nonobstructing LEFT renal stones, measuring 1 mm and 5 mm respectively. Bladder is unremarkable.  Stomach/Bowel: Bowel is normal in caliber. No convincing evidence of bowel wall thickening or bowel wall inflammation. Appendix is normal. Stomach is unremarkable.  Vascular/Lymphatic: Aortic atherosclerosis. No enlarged abdominal or pelvic lymph nodes.  Reproductive: Prostate gland is moderately enlarged  Other: No abscess collection seen.  No free intraperitoneal air.  Musculoskeletal: No acute or suspicious osseous finding surgical hardware within the lumbar spine appears stable in position.  IMPRESSION: 1. Obstructing 4 mm stone within the proximal RIGHT ureter causing moderate RIGHT-sided hydronephrosis and perinephric inflammation. 2. Bilateral nephrolithiasis, as detailed above. 3. Aortic atherosclerosis. 4. Prostate gland is enlarged. Consider correlation with PSA lab values and/or physical exam findings.   Electronically Signed   By: Bary RichardStan  Maynard M.D.   On: 02/13/2018 17:25     Assessment & Plan:   63 year old male with recurrent stone disease.  He recently passed a  right proximal ureteral calculus and has bilateral nephrolithiasis.  Will obtain a KUB to determine if his remaining calculi can be seen on plain x-ray.  We discussed potential options including surveillance, shockwave lithotripsy and ureteroscopy.  He will be notified with his KUB results.    I also recommended a metabolic evaluation.    Riki Altes, MD  [facility] [address] [place], [place] [address] [phone]

## 2018-08-22 NOTE — Patient Instructions (Signed)

## 2018-08-22 NOTE — Progress Notes (Signed)
08/22/2018 3:11 PM   Shawn Sherman 1955/12/17 [id number]  Referring provider: Anola Gurney, PA [address] [place], [place] [address]  Chief Complaint  Patient presents with  . Nephrolithiasis    HPI: Shawn Sherman is a 63 year old male seen for evaluation of nephrolithiasis.  He presented to the ED on 02/13/2018 with acute onset of right flank pain radiating to the right lower quadrant.  There were no identifiable precipitating, aggravating or alleviating factors.  He had nausea without vomiting.  Denied fever or chills. He was initially seen by his primary provider and sent to the ED for pain control.  A stone protocol CT of the abdomen and pelvis was performed which showed a 4 mm right proximal ureteral calculus with moderate hydronephrosis.  He was also noted to have bilateral nephrolithiasis with 4 right renal calculi measuring from 1-7 mm in size and 2 left renal calculi measuring 1 and 5 mm.  He subsequently passed the right renal calculus which he brings in today.  He is presently asymptomatic.  He has a prior history of ureteroscopic stone removal and shockwave lithotripsy.  He states prior stone analysis have been calcium oxalate.  He has not had a metabolic evaluation to his knowledge.  PMH: Past Medical History:  Diagnosis Date  . Arthritis   . Essential hypertension 03/29/2017  . GERD (gastroesophageal reflux disease)   . History of kidney stones     Surgical History: Past Surgical History:  Procedure Laterality Date  . ANTERIOR LAT LUMBAR FUSION Right 01/01/2013   Procedure: lumbar three-four extreme lateral interbody fusion;  Surgeon: Reinaldo Meeker, MD;  Location: [facility];  Service: Neurosurgery;  Laterality: Right;  Lumbar three-four extreme lateral interbody fusion    . BACK SURGERY  08  . ESOPHAGOGASTRODUODENOSCOPY (EGD) WITH PROPOFOL N/A 07/05/2015   Procedure: ESOPHAGOGASTRODUODENOSCOPY (EGD) WITH PROPOFOL;  Surgeon: Midge Minium, MD;   Location: [facility];  Service: Endoscopy;  Laterality: N/A;  . LUMBAR PERCUTANEOUS PEDICLE SCREW 1 LEVEL N/A 01/01/2013   Procedure: lumbar three-four percutaneous pedicle screws ;  Surgeon: Reinaldo Meeker, MD;  Location: [facility];  Service: Neurosurgery;  Laterality: N/A;  lumbar three-four percutaneous pedicle screws    Home Medications:  Allergies as of 08/22/2018   No Known Allergies     Medication List       Accurate as of August 22, 2018  3:11 PM. Always use your most recent med list.        amLODipine 10 MG tablet Commonly known as:  NORVASC Take 1 tablet (10 mg total) by mouth daily.   atorvastatin 80 MG tablet Commonly known as:  LIPITOR Take 1 tablet (80 mg total) by mouth daily.   esomeprazole 40 MG capsule Commonly known as:  NEXIUM One daily   ketorolac 0.5 % ophthalmic solution Commonly known as:  ACULAR Place 1 drop into the right eye 4 (four) times daily.   meloxicam 15 MG tablet Commonly known as:  MOBIC Take 1 tablet (15 mg total) by mouth daily.   tamsulosin 0.4 MG Caps capsule Commonly known as:  FLOMAX Take 1 capsule (0.4 mg total) by mouth daily.       Allergies: No Known Allergies  Family History: No family history on file.  Social History:  reports that he has never smoked. His smokeless tobacco use includes snuff. He reports that he does not drink alcohol or use drugs.  ROS: UROLOGY Frequent Urination?: No Hard to postpone urination?: No  Burning/pain with urination?: No Get up at night to urinate?: No Leakage of urine?: No Urine stream starts and stops?: No Trouble starting stream?: No Do you have to strain to urinate?: No Blood in urine?: No Urinary tract infection?: No Sexually transmitted disease?: No Injury to kidneys or bladder?: No Painful intercourse?: No Weak stream?: No Erection problems?: No Penile pain?: No  Gastrointestinal Nausea?: No Vomiting?: No Indigestion/heartburn?: No Diarrhea?:  No Constipation?: No  Constitutional Fever: No Night sweats?: No Weight loss?: No Fatigue?: No  Skin Skin rash/lesions?: No Itching?: No  Eyes Blurred vision?: No Double vision?: No  Ears/Nose/Throat Sore throat?: No Sinus problems?: No  Hematologic/Lymphatic Swollen glands?: No Easy bruising?: No  Cardiovascular Leg swelling?: No Chest pain?: No  Respiratory Cough?: No Shortness of breath?: No  Endocrine Excessive thirst?: No  Musculoskeletal Back pain?: Yes Joint pain?: Yes  Neurological Headaches?: No Dizziness?: No  Psychologic Depression?: No Anxiety?: No  Physical Exam: BP (!) 142/73 (BP Location: Left Arm, Patient Position: Sitting, Cuff Size: Normal)   Pulse 74   Ht 5' 10" (1.778 m)   Wt 174 lb 9.6 oz (79.2 kg)   BMI 25.05 kg/m   Constitutional:  Alert and oriented, No acute distress. HEENT: [place] AT, moist mucus membranes.  Trachea midline, no masses. Cardiovascular: No clubbing, cyanosis, or edema.  Clear Respiratory: Normal respiratory effort, no increased work of breathing.  RRR GI: Abdomen is soft, nontender, nondistended, no abdominal masses GU: No CVA tenderness Lymph: No cervical or inguinal lymphadenopathy. Skin: No rashes, bruises or suspicious lesions. Neurologic: Grossly intact, no focal deficits, moving all 4 extremities. Psychiatric: Normal mood and affect.   Pertinent Imaging: CT was personally reviewed  Results for orders placed during the hospital encounter of 02/13/18  CT RENAL STONE STUDY   Narrative CLINICAL DATA:  RIGHT flank pain for 3 days. History of kidney stone.  EXAM: CT ABDOMEN AND PELVIS WITHOUT CONTRAST  TECHNIQUE: Multidetector CT imaging of the abdomen and pelvis was performed following the standard protocol without IV contrast.  COMPARISON:  CT abdomen dated 11/19/2014.  FINDINGS: Lower chest: No acute abnormality.  Hepatobiliary: No focal liver abnormality is seen. No gallstones, gallbladder  wall thickening, or biliary dilatation.  Pancreas: Unremarkable. No pancreatic ductal dilatation or surrounding inflammatory changes.  Spleen: Normal in size without focal abnormality.  Adrenals/Urinary Tract: Obstructing 4 mm stone within the proximal RIGHT ureter causing moderate RIGHT-sided hydronephrosis and perinephric inflammation.  At least 4 additional RIGHT renal stones, measuring 1 to 7 mm in size. There are 2 nonobstructing LEFT renal stones, measuring 1 mm and 5 mm respectively. Bladder is unremarkable.  Stomach/Bowel: Bowel is normal in caliber. No convincing evidence of bowel wall thickening or bowel wall inflammation. Appendix is normal. Stomach is unremarkable.  Vascular/Lymphatic: Aortic atherosclerosis. No enlarged abdominal or pelvic lymph nodes.  Reproductive: Prostate gland is moderately enlarged  Other: No abscess collection seen.  No free intraperitoneal air.  Musculoskeletal: No acute or suspicious osseous finding surgical hardware within the lumbar spine appears stable in position.  IMPRESSION: 1. Obstructing 4 mm stone within the proximal RIGHT ureter causing moderate RIGHT-sided hydronephrosis and perinephric inflammation. 2. Bilateral nephrolithiasis, as detailed above. 3. Aortic atherosclerosis. 4. Prostate gland is enlarged. Consider correlation with PSA lab values and/or physical exam findings.   Electronically Signed   By: Stan  Maynard M.D.   On: 02/13/2018 17:25     Assessment & Plan:   62-year-old male with recurrent stone disease.  He recently passed a   right proximal ureteral calculus and has bilateral nephrolithiasis.  Will obtain a KUB to determine if his remaining calculi can be seen on plain x-ray.  We discussed potential options including surveillance, shockwave lithotripsy and ureteroscopy.  He will be notified with his KUB results.    I also recommended a metabolic evaluation.    Riki Altes, MD  [facility] [address] [place], [place] [address] [phone]

## 2018-08-24 ENCOUNTER — Encounter: Payer: Self-pay | Admitting: Urology

## 2018-08-25 ENCOUNTER — Telehealth: Payer: Self-pay | Admitting: Family Medicine

## 2018-08-25 NOTE — Telephone Encounter (Signed)
Patient notified and wants to proceed with ESWL

## 2018-08-25 NOTE — Telephone Encounter (Signed)
-----   Message from Riki Altes, MD sent at 08/24/2018 10:17 PM EST ----- There is a large amount of stool and bowel gas obscuring the renal outlines. Larger stones can be seen in each kidney. Can schedule ESWL if he desires.

## 2018-08-29 ENCOUNTER — Encounter: Payer: Self-pay | Admitting: Cardiovascular Disease

## 2018-08-29 ENCOUNTER — Telehealth: Payer: Self-pay | Admitting: Cardiovascular Disease

## 2018-08-29 ENCOUNTER — Ambulatory Visit: Payer: Self-pay | Admitting: Cardiovascular Disease

## 2018-08-29 ENCOUNTER — Ambulatory Visit (INDEPENDENT_AMBULATORY_CARE_PROVIDER_SITE_OTHER): Payer: 59 | Admitting: Cardiovascular Disease

## 2018-08-29 VITALS — BP 158/82 | HR 73 | Ht 70.0 in | Wt 179.0 lb

## 2018-08-29 DIAGNOSIS — R0989 Other specified symptoms and signs involving the circulatory and respiratory systems: Secondary | ICD-10-CM

## 2018-08-29 DIAGNOSIS — R079 Chest pain, unspecified: Secondary | ICD-10-CM

## 2018-08-29 DIAGNOSIS — I10 Essential (primary) hypertension: Secondary | ICD-10-CM | POA: Diagnosis not present

## 2018-08-29 DIAGNOSIS — E782 Mixed hyperlipidemia: Secondary | ICD-10-CM | POA: Diagnosis not present

## 2018-08-29 NOTE — Patient Instructions (Addendum)
Medication Instructions:  No changes If you need a refill on your cardiac medications before your next appointment, please call your pharmacy.   Lab work: None ordered  Testing/Procedures: Your physician has requested that you have a carotid duplex. This test is an ultrasound of the carotid arteries in your neck. It looks at blood flow through these arteries that supply the brain with blood. Allow one hour for this exam. There are no restrictions or special instructions. This will take place at [address], the [facility] office.  Your physician has requested that you have an exercise stress myoview. For further information please visit [email or web address] Please follow instruction sheet, as given.  Follow-Up: At [facility], you and your health needs are our priority.  As part of our continuing mission to provide you with exceptional heart care, we have created designated Provider Care Teams.  These Care Teams include your primary Cardiologist (physician) and Advanced Practice Providers (APPs -  Physician Assistants and Nurse Practitioners) who all work together to provide you with the care you need, when you need it. You will need a follow up appointment in 3 months.You may see Dr. Kirke Corin or one of the following Advanced Practice Providers on your designated Care Team:   Nicolasa Ducking, NP Eula Listen, PA-C . Marisue Ivan, PA-C  Any Other Special Instructions Will Be Listed Below (If Applicable). -Please hold aspirin 5 days prior to your procedure   [facility] MYOVIEW  Your provider has ordered a Stress Test with nuclear imaging. The purpose of this test is to evaluate the blood supply to your heart muscle. This procedure is referred to as a "Non-Invasive Stress Test." This is because other than having an IV started in your vein, nothing is inserted or "invades" your body. Cardiac stress tests are done to find areas of poor blood flow to the heart by determining the  extent of coronary artery disease (CAD). Some patients exercise on a treadmill, which naturally increases the blood flow to your heart, while others who are unable to walk on a treadmill due to physical limitations have a pharmacologic/chemical stress agent called Lexiscan . This medicine will mimic walking on a treadmill by temporarily increasing your coronary blood flow.   Please note: these test may take anywhere between 2-4 hours to complete  PLEASE REPORT TO [facility] MEDICAL MALL ENTRANCE  THE VOLUNTEERS AT THE FIRST DESK WILL DIRECT YOU WHERE TO GO  Date of Procedure:_____________________________________  Arrival Time for Procedure:______________________________  Instructions regarding medication:  None to hold  PLEASE NOTIFY THE OFFICE AT LEAST 24 HOURS IN ADVANCE IF YOU ARE UNABLE TO KEEP YOUR APPOINTMENT.  [phone] AND  PLEASE NOTIFY NUCLEAR MEDICINE AT [facility] AT LEAST 24 HOURS IN ADVANCE IF YOU ARE UNABLE TO KEEP YOUR APPOINTMENT. [phone]  How to prepare for your Myoview test:  1. Do not eat or drink after midnight 2. No caffeine for 24 hours prior to test 3. No smoking 24 hours prior to test. 4. Your medication may be taken with water.  If your doctor stopped a medication because of this test, do not take that medication. 5. Ladies, please do not wear dresses.  Skirts or pants are appropriate. Please wear a short sleeve shirt. 6. No perfume, cologne or lotion. 7. Wear comfortable walking shoes. No heels!

## 2018-08-29 NOTE — Telephone Encounter (Signed)
   [facility] Pre-operative Risk Assessment    Request for surgical clearance:  1. What type of surgery is being performed?  EXTRACORPOREAL CHOCKWAVE LITHOTRIPSY  2.  3. When is this surgery scheduled? 09/11/18  4. What type of clearance is required (medical clearance vs. Pharmacy clearance to hold med vs. Both)? NOT LISTED  5. Are there any medications that need to be held prior to surgery and how long? NOT LISTED  6. Practice name and name of physician performing surgery?  DR SCOTT [facility]IO[facility]  7. What is your office phone number [phone]   7.   What is your office fax number [phone]  8.   Anesthesia type (None, local, MAC, general) ?  NOT LISTED  Lucienne Minks 08/29/2018, 1:47 PM  _________________________________________________________________   (provider comments below)

## 2018-08-29 NOTE — Telephone Encounter (Signed)
--->  UPDATED!!     [facility] Pre-operative Risk Assessment    Request for surgical clearance:  1. What type of surgery is being performed?  EXTRACORPOREAL CHOCKWAVE LITHOTRIPSY  2.  3. When is this surgery scheduled? 09/11/18  4. What type of clearance is required (medical clearance vs. Pharmacy clearance to hold med vs. Both)? MEDICAL  5. Are there any medications that need to be held prior to surgery and how long? NONE  6. Practice name and name of physician performing surgery?  DR Nicki Reaper [facility] ATTN:LEAH  7. What is your office phone number [phone]   7.   What is your office fax number [phone]  8.   Anesthesia type (None, local, MAC, general) ?  MODERATE   ---> SPOKE WITH LEAH SHE STATES THAT PT RECENTLY HAD CHEST PAIN AND NEEDS TO BE CLEARED PER ANESTHESIA

## 2018-08-29 NOTE — Progress Notes (Signed)
Cardiology Office Note   Date:  08/29/2018   ID:  Conwell, Vito 12-13-1955, MRN [id number]  PCP:  Anola Gurney, PA  Cardiologist:   Lorine Bears, MD   Chief Complaint  Patient presents with  . New Patient (Initial Visit)    [facility] F/U CP, no cardiac hx.CP comes and goes,SOB Dizziness. Surgical clearance. Medications reviewed verbally with patient.       History of Present Illness: Shawn Sherman is a 63 y.o. male who was referred by Dr. Lonna Cobb for evaluation of chest pain and preoperative cardiovascular evaluation before lithotripsy.  Patient has known history of hypertension, hyperlipidemia and tobacco use.  He chews tobacco but does not smoke.  He has no family history of coronary artery disease. He was hospitalized at [facility] in 2012 with atypical chest pain.  He had a nuclear stress test done which was normal. He reports prolonged intermittent episodes of chest pain which goes back to many years ago.  Typically he gets an episode every 1 or 2 months where he feels substernal chest tightness and pressure.  The last episode in January radiating to his jaw and lasted for about 20 minutes.  However, this episodes are not persistent and typically he goes months without having 1.  He has mild exertional dyspnea with no orthopnea or PND.    Past Medical History:  Diagnosis Date  . Arthritis   . Essential hypertension 03/29/2017  . GERD (gastroesophageal reflux disease)   . History of kidney stones     Past Surgical History:  Procedure Laterality Date  . ANTERIOR LAT LUMBAR FUSION Right 01/01/2013   Procedure: lumbar three-four extreme lateral interbody fusion;  Surgeon: Reinaldo Meeker, MD;  Location: [facility];  Service: Neurosurgery;  Laterality: Right;  Lumbar three-four extreme lateral interbody fusion    . BACK SURGERY  08  . ESOPHAGOGASTRODUODENOSCOPY (EGD) WITH PROPOFOL N/A 07/05/2015   Procedure: ESOPHAGOGASTRODUODENOSCOPY (EGD) WITH PROPOFOL;  Surgeon: Midge Minium, MD;  Location: [facility] ENDOSCOPY;  Service: Endoscopy;  Laterality: N/A;  . LUMBAR PERCUTANEOUS PEDICLE SCREW 1 LEVEL N/A 01/01/2013   Procedure: lumbar three-four percutaneous pedicle screws ;  Surgeon: Reinaldo Meeker, MD;  Location: [facility];  Service: Neurosurgery;  Laterality: N/A;  lumbar three-four percutaneous pedicle screws     Current Outpatient Medications  Medication Sig Dispense Refill  . amLODipine (NORVASC) 10 MG tablet Take 1 tablet (10 mg total) by mouth daily. 90 tablet 3  . atorvastatin (LIPITOR) 80 MG tablet Take 1 tablet (80 mg total) by mouth daily. 90 tablet 3  . esomeprazole (NEXIUM) 40 MG capsule One daily 90 capsule 1  . ketorolac (ACULAR) 0.5 % ophthalmic solution Place 1 drop into the right eye 4 (four) times daily. 5 mL 0   No current facility-administered medications for this visit.     Allergies:   Patient has no known allergies.    Social History:  The patient  reports that he has never smoked. His smokeless tobacco use includes snuff. He reports that he does not drink alcohol or use drugs.   Family History:  The patient's family history is remarkable for cancer and diabetes mellitus.  No family history of coronary artery disease.   ROS:  Please see the history of present illness.   Otherwise, review of systems are positive for none.   All other systems are reviewed and negative.    PHYSICAL EXAM: VS:  BP (!) 158/82 (BP Location: Right Arm, Patient  Position: Sitting, Cuff Size: Normal)   Pulse 73   Ht 5\' 10"  (1.778 m)   Wt 179 lb (81.2 kg)   BMI 25.68 kg/m  , BMI Body mass index is 25.68 kg/m. GEN: Well nourished, well developed, in no acute distress  HEENT: normal  Neck: no JVD,  or masses.  Left carotid bruit Cardiac: RRR; no murmurs, rubs, or gallops,no edema  Respiratory:  clear to auscultation bilaterally, normal work of breathing GI: soft, nontender, nondistended, + BS MS: no deformity or atrophy  Skin: warm and dry, no  rash Neuro:  Strength and sensation are intact Psych: euthymic mood, full affect   EKG:  EKG is ordered today. The ekg ordered today demonstrates normal sinus rhythm with no significant ST or T wave changes.   Recent Labs: 02/13/2018: ALT 30 08/05/2018: BUN 13; Creatinine, Ser 0.79; Hemoglobin 16.0; Platelets 195; Potassium 4.1; Sodium 136    Lipid Panel    Component Value Date/Time   CHOL 143 04/19/2017 1143   CHOL 289 (H) 03/07/2017 0945   TRIG 88 04/19/2017 1143   HDL 53 04/19/2017 1143   HDL 46 03/07/2017 0945   CHOLHDL 2.7 04/19/2017 1143   LDLCALC 73 04/19/2017 1143      Wt Readings from Last 3 Encounters:  08/29/18 179 lb (81.2 kg)  08/22/18 174 lb 9.6 oz (79.2 kg)  08/05/18 180 lb (81.6 kg)      PAD Screen 08/29/2018  Previous PAD dx? No  Previous surgical procedure? No  Pain with walking? No  Feet/toe relief with dangling? No  Painful, non-healing ulcers? No  Extremities discolored? No      ASSESSMENT AND PLAN:  1.  Atypical chest pain: The patient has multiple risk factors for coronary artery disease.  Fortunately, his EKG does not show any ischemic changes.  I recommend evaluation with a treadmill nuclear stress test. If stress test is low risk, he can proceed with lithotripsy without further work-up at an overall low risk. I asked him to hold aspirin 5 days before.  2.  Left carotid bruit: No previous history of CVA.  I requested carotid Doppler.  3.  Essential hypertension: Blood pressure is mildly elevated on amlodipine but he reports that his blood pressure is usually high at the doctor's office.  Continue to monitor for now.  4.  Hyperlipidemia: Currently on atorvastatin 80 mg once daily.  Most recent lipid profile in 2018 showed an LDL of 73.    Disposition:   FU with me in 3 months  Signed,  Lorine Bears, MD  08/29/2018 3:28 PM    [facility]

## 2018-08-29 NOTE — Telephone Encounter (Signed)
Patient was seen by Dr. Kirke Corin today 08/29/18 for chest pain and surgical clearance. Dr. Kirke Corin recommends treadmill nuclear stress test for clearance.

## 2018-08-29 NOTE — Telephone Encounter (Signed)
Pt. Desires ESWL but has to have cardiac clearance. I called Dr. Serita Kyle office and got the patient in today. The pt. Request ESWL for 09/11/18 due to work obligations next week. I called pt. To give him appt. Date and time and he understands due to his ER visit and having chest pains he will go to the cardiologist today. After I receive the clearance the pt. Has agreed to come in to fill out paper work and proceed with ESWL.

## 2018-09-02 ENCOUNTER — Ambulatory Visit
Admission: RE | Admit: 2018-09-02 | Discharge: 2018-09-02 | Disposition: A | Discharge status: Home / Self Care | Payer: 59 | Source: Ambulatory Visit | Attending: Cardiovascular Disease | Admitting: Cardiovascular Disease

## 2018-09-02 DIAGNOSIS — R079 Chest pain, unspecified: Secondary | ICD-10-CM | POA: Diagnosis present

## 2018-09-02 LAB — NM MYOCAR MULTI W/SPECT W/WALL MOTION / EF
CSEPED: 6 min
CSEPEDS: 12 s
CSEPEW: 7 METS
CSEPHR: 89 %
LV sys vol: 34 mL
LVDIAVOL: 116 mL (ref 62–150)
MPHR: 158 {beats}/min
Peak HR: 141 {beats}/min
Rest HR: 60 {beats}/min
TID: 1

## 2018-09-02 MED ORDER — TECHNETIUM TC 99M TETROFOSMIN IV KIT
10.0000 | PACK | Freq: Once | INTRAVENOUS | Status: AC | PRN
  Administered 2018-09-02: 11.17 via INTRAVENOUS

## 2018-09-02 MED ORDER — TECHNETIUM TC 99M TETROFOSMIN IV KIT
30.0000 | PACK | Freq: Once | INTRAVENOUS | Status: AC | PRN
  Administered 2018-09-02: 31.09 via INTRAVENOUS

## 2018-09-03 NOTE — Telephone Encounter (Signed)
Given that lithotripsy is an overall low risk procedure, the patient can proceed at an overall low risk from a cardiac standpoint.  Misty Stanley, Have  the patient follow-up with me 1- 2 weeks after lithotripsy as he might require further ischemic cardiac evaluation.

## 2018-09-03 NOTE — Telephone Encounter (Signed)
   Primary Cardiologist: Lorine Bears, MD  Chart reviewed as part of pre-operative protocol coverage. Given past medical history and time since last visit, based on ACC/AHA guidelines, Shawn Sherman would be at acceptable risk for the planned procedure without further cardiovascular testing.   I will route this recommendation to the requesting party via Epic fax function and remove from pre-op pool.  Please call with questions.  Malisha Mabey, PA-C 09/03/2018, 1:20 PM

## 2018-09-04 NOTE — Telephone Encounter (Signed)
Appointment made for 10/03/2018 with Dr. Kirke Corin. The patient has verbalized his understanding.

## 2018-09-04 NOTE — Telephone Encounter (Signed)
Called pt. To notify him of Clearance for ESWL. Pt. Will come in 09/05/18 to fill out paper work. I will need orders. ESWL date is 09/11/18.

## 2018-09-05 ENCOUNTER — Encounter: Payer: Self-pay | Admitting: Urology

## 2018-09-08 ENCOUNTER — Other Ambulatory Visit: Payer: Self-pay | Admitting: Radiology

## 2018-09-08 ENCOUNTER — Encounter: Payer: Self-pay | Admitting: *Deleted

## 2018-09-08 DIAGNOSIS — N200 Calculus of kidney: Secondary | ICD-10-CM

## 2018-09-10 MED ORDER — CIPROFLOXACIN HCL 500 MG PO TABS
500.0000 mg | ORAL_TABLET | ORAL | Status: AC
  Administered 2018-09-11: 500 mg via ORAL

## 2018-09-11 ENCOUNTER — Other Ambulatory Visit: Payer: Self-pay

## 2018-09-11 ENCOUNTER — Ambulatory Visit
Admission: RE | Admit: 2018-09-11 | Discharge: 2018-09-11 | Disposition: A | Discharge status: Home / Self Care | Payer: 59 | Attending: Urology | Admitting: Urology

## 2018-09-11 ENCOUNTER — Encounter: Payer: Self-pay | Admitting: *Deleted

## 2018-09-11 ENCOUNTER — Encounter
Admission: RE | Disposition: A | Discharge status: Home / Self Care | Payer: Self-pay | Source: Home / Self Care | Attending: Urology

## 2018-09-11 ENCOUNTER — Ambulatory Visit: Payer: 59

## 2018-09-11 DIAGNOSIS — N132 Hydronephrosis with renal and ureteral calculous obstruction: Secondary | ICD-10-CM | POA: Insufficient documentation

## 2018-09-11 DIAGNOSIS — K219 Gastro-esophageal reflux disease without esophagitis: Secondary | ICD-10-CM | POA: Insufficient documentation

## 2018-09-11 DIAGNOSIS — Z981 Arthrodesis status: Secondary | ICD-10-CM | POA: Diagnosis not present

## 2018-09-11 DIAGNOSIS — Z87442 Personal history of urinary calculi: Secondary | ICD-10-CM | POA: Diagnosis not present

## 2018-09-11 DIAGNOSIS — I10 Essential (primary) hypertension: Secondary | ICD-10-CM | POA: Insufficient documentation

## 2018-09-11 DIAGNOSIS — N200 Calculus of kidney: Secondary | ICD-10-CM

## 2018-09-11 DIAGNOSIS — M1990 Unspecified osteoarthritis, unspecified site: Secondary | ICD-10-CM | POA: Insufficient documentation

## 2018-09-11 DIAGNOSIS — N202 Calculus of kidney with calculus of ureter: Secondary | ICD-10-CM | POA: Diagnosis present

## 2018-09-11 HISTORY — DX: Hyperlipidemia, unspecified: E78.5

## 2018-09-11 HISTORY — DX: Other specified symptoms and signs involving the circulatory and respiratory systems: R09.89

## 2018-09-11 HISTORY — PX: EXTRACORPOREAL SHOCK WAVE LITHOTRIPSY: SHX1557

## 2018-09-11 HISTORY — DX: Angina pectoris, unspecified: I20.9

## 2018-09-11 SURGERY — LITHOTRIPSY, ESWL
Anesthesia: Moderate Sedation | Laterality: Right

## 2018-09-11 MED ORDER — ONDANSETRON HCL 4 MG/2ML IJ SOLN
INTRAMUSCULAR | Status: AC
  Filled 2018-09-11: qty 2

## 2018-09-11 MED ORDER — TAMSULOSIN HCL 0.4 MG PO CAPS: 0.4000 mg | ORAL_CAPSULE | Freq: Every day | ORAL | 0 refills | Status: DC

## 2018-09-11 MED ORDER — DIAZEPAM 5 MG PO TABS
ORAL_TABLET | ORAL | Status: AC
  Filled 2018-09-11: qty 2

## 2018-09-11 MED ORDER — DIAZEPAM 5 MG PO TABS
10.0000 mg | ORAL_TABLET | ORAL | Status: AC
  Administered 2018-09-11: 10 mg via ORAL

## 2018-09-11 MED ORDER — HYDROCODONE-ACETAMINOPHEN 5-325 MG PO TABS: 1.0000 | ORAL_TABLET | ORAL | 0 refills | Status: DC | PRN

## 2018-09-11 MED ORDER — ONDANSETRON HCL 2 MG/ML IV SOLN: 4.0000 mg | Freq: Once | INTRAVENOUS | Status: DC

## 2018-09-11 MED ORDER — ONDANSETRON HCL 4 MG/2ML IJ SOLN
4.0000 mg | Freq: Once | INTRAMUSCULAR | Status: AC
  Administered 2018-09-11: 4 mg via INTRAVENOUS

## 2018-09-11 MED ORDER — DIPHENHYDRAMINE HCL 25 MG PO CAPS
ORAL_CAPSULE | ORAL | Status: AC
  Filled 2018-09-11: qty 1

## 2018-09-11 MED ORDER — CIPROFLOXACIN HCL 500 MG PO TABS
ORAL_TABLET | ORAL | Status: AC
  Filled 2018-09-11: qty 1

## 2018-09-11 MED ORDER — DIPHENHYDRAMINE HCL 25 MG PO CAPS
25.0000 mg | ORAL_CAPSULE | ORAL | Status: AC
  Administered 2018-09-11: 25 mg via ORAL

## 2018-09-11 MED ORDER — SODIUM CHLORIDE 0.9 % IV SOLN
INTRAVENOUS | Status: DC
  Administered 2018-09-11: 08:00:00 via INTRAVENOUS

## 2018-09-11 NOTE — Interval H&P Note (Signed)
History and Physical Interval Note:  09/11/2018 8:57 AM  Shawn Sherman  has presented today for surgery, with the diagnosis of Kidney stone  The various methods of treatment have been discussed with the patient and family. After consideration of risks, benefits and other options for treatment, the patient has consented to  Procedure(s): EXTRACORPOREAL SHOCK WAVE LITHOTRIPSY (ESWL) (Right) as a surgical intervention .  The patient's history has been reviewed, patient examined, no change in status, stable for surgery.  I have reviewed the patient's chart and labs.  Questions were answered to the patient's satisfaction.     Scott C Stoioff

## 2018-09-11 NOTE — Discharge Instructions (Signed)
As per the [facility] discharge instructions

## 2018-09-25 ENCOUNTER — Telehealth: Payer: Self-pay | Admitting: *Deleted

## 2018-09-25 NOTE — Telephone Encounter (Signed)
     Cardiac Questionnaire:    Since your last visit or hospitalization:    1. Have you been having new or worsening chest pain? NO   2. Have you been having new or worsening shortness of breath? NO 3. Have you been having new or worsening leg swelling, wt gain, or increase in abdominal girth (pants fitting more tightly)? NO   4. Have you had any passing out spells? NO    *A YES to any of these questions would result in the appointment being kept. *If all the answers to these questions are NO, we should indicate that given the current situation regarding the worldwide coronarvirus pandemic, at the recommendation of the CDC, we are looking to limit gatherings in our waiting area, and thus will reschedule their appointment beyond four weeks from today.   _____________      Primary Cardiologist:  Lorine Bears, MD   Patient contacted.  History reviewed.  No symptoms to suggest any unstable cardiac conditions.  Based on discussion, with current pandemic situation, we will be postponing this appointment for @PATIENTNAME @.  If symptoms change, he has been instructed to contact our office.  Routing to C19 CANCEL pool for tracking (P CV DIV CV19 CANCEL).  Catalina Gravel, RN  09/25/2018 1:05 PM         .

## 2018-09-26 ENCOUNTER — Other Ambulatory Visit: Payer: Self-pay

## 2018-09-26 ENCOUNTER — Ambulatory Visit (INDEPENDENT_AMBULATORY_CARE_PROVIDER_SITE_OTHER): Payer: 59 | Admitting: Urology

## 2018-09-26 ENCOUNTER — Encounter: Payer: Self-pay | Admitting: Urology

## 2018-09-26 ENCOUNTER — Other Ambulatory Visit: Payer: Self-pay | Admitting: Urology

## 2018-09-26 ENCOUNTER — Ambulatory Visit
Admission: RE | Admit: 2018-09-26 | Discharge: 2018-09-26 | Disposition: A | Discharge status: Home / Self Care | Payer: 59 | Attending: Urology | Admitting: Urology

## 2018-09-26 ENCOUNTER — Ambulatory Visit: Payer: 59 | Admitting: Urology

## 2018-09-26 ENCOUNTER — Ambulatory Visit
Admission: RE | Admit: 2018-09-26 | Discharge: 2018-09-26 | Disposition: A | Discharge status: Home / Self Care | Payer: 59 | Source: Ambulatory Visit | Attending: Urology | Admitting: Urology

## 2018-09-26 VITALS — BP 119/66 | HR 72 | Ht 70.0 in | Wt 179.0 lb

## 2018-09-26 DIAGNOSIS — N200 Calculus of kidney: Secondary | ICD-10-CM

## 2018-09-26 NOTE — Progress Notes (Signed)
09/26/2018 10:31 AM   Shawn Sherman 11/27/1955 [id number]  Referring provider: Anola Gurney, PA No address on file  Chief Complaint  Patient presents with  . Nephrolithiasis    HPI: 63 year old male presents for postop follow-up.  He is status post shockwave lithotripsy of a right upper pole renal calculus on 09/11/2018.  He passed several small fragments and had no significant postoperative problems.  He has no complaints today.  KUB performed today was reviewed and there are a few calcifications just below the right 11th rib most likely representing small residual fragments.   PMH: Past Medical History:  Diagnosis Date  . Anginal pain (HCC) 08/2018   recent atypical chest pain.  r/o for MI  . Arthritis   . Carotid bruit present 08/2018   left  . Essential hypertension 03/29/2017  . GERD (gastroesophageal reflux disease)   . History of kidney stones   . Hyperlipidemia     Surgical History: Past Surgical History:  Procedure Laterality Date  . ANTERIOR LAT LUMBAR FUSION Right 01/01/2013   Procedure: lumbar three-four extreme lateral interbody fusion;  Surgeon: Reinaldo Meeker, MD;  Location: [facility];  Service: Neurosurgery;  Laterality: Right;  Lumbar three-four extreme lateral interbody fusion    . BACK SURGERY  08  . ESOPHAGOGASTRODUODENOSCOPY (EGD) WITH PROPOFOL N/A 07/05/2015   Procedure: ESOPHAGOGASTRODUODENOSCOPY (EGD) WITH PROPOFOL;  Surgeon: Midge Minium, MD;  Location: [facility] ENDOSCOPY;  Service: Endoscopy;  Laterality: N/A;  . EXTRACORPOREAL SHOCK WAVE LITHOTRIPSY Right 09/11/2018   Procedure: EXTRACORPOREAL SHOCK WAVE LITHOTRIPSY (ESWL);  Surgeon: Riki Altes, MD;  Location: [facility] ORS;  Service: Urology;  Laterality: Right;  . LUMBAR PERCUTANEOUS PEDICLE SCREW 1 LEVEL N/A 01/01/2013   Procedure: lumbar three-four percutaneous pedicle screws ;  Surgeon: Reinaldo Meeker, MD;  Location: [facility];  Service: Neurosurgery;  Laterality: N/A;  lumbar three-four  percutaneous pedicle screws    Home Medications:  Allergies as of 09/26/2018   No Known Allergies     Medication List       Accurate as of September 26, 2018 10:31 AM. Always use your most recent med list.        amLODipine 10 MG tablet Commonly known as:  NORVASC Take 1 tablet (10 mg total) by mouth daily.   atorvastatin 80 MG tablet Commonly known as:  LIPITOR Take 1 tablet (80 mg total) by mouth daily.   esomeprazole 40 MG capsule Commonly known as:  NEXIUM One daily       Allergies: No Known Allergies  Family History: No family history on file.  Social History:  reports that he has never smoked. His smokeless tobacco use includes snuff. He reports that he does not drink alcohol or use drugs.  ROS: UROLOGY Frequent Urination?: No Hard to postpone urination?: No Burning/pain with urination?: No Get up at night to urinate?: No Leakage of urine?: No Urine stream starts and stops?: No Trouble starting stream?: No Do you have to strain to urinate?: No Blood in urine?: No Urinary tract infection?: No Sexually transmitted disease?: No Injury to kidneys or bladder?: No Painful intercourse?: No Weak stream?: No Erection problems?: No Penile pain?: No  Gastrointestinal Nausea?: No Vomiting?: No Indigestion/heartburn?: No Diarrhea?: No Constipation?: No  Constitutional Fever: No Night sweats?: No Weight loss?: No Fatigue?: No  Skin Skin rash/lesions?: No Itching?: No  Eyes Blurred vision?: No Double vision?: No  Ears/Nose/Throat Sore throat?: No Sinus problems?: No  Hematologic/Lymphatic Swollen glands?: No Easy bruising?: No  Cardiovascular Leg swelling?: No Chest pain?: No  Respiratory Cough?: No Shortness of breath?: No  Endocrine Excessive thirst?: No  Musculoskeletal Back pain?: No Joint pain?: No  Neurological Headaches?: No Dizziness?: No  Psychologic Depression?: No Anxiety?: No  Physical Exam: BP 119/66 (BP  Location: Left Arm, Patient Position: Sitting, Cuff Size: Normal)   Pulse 72   Ht 5\' 10"  (1.778 m)   Wt 179 lb (81.2 kg)   BMI 25.68 kg/m   Constitutional:  Alert and oriented, No acute distress. HEENT: [place] AT, moist mucus membranes.  Trachea midline, no masses. Cardiovascular: No clubbing, cyanosis, or edema. Respiratory: Normal respiratory effort, no increased work of breathing. Neurologic: Grossly intact, no focal deficits, moving all 4 extremities. Psychiatric: Normal mood and affect.   Assessment & Plan:   Status post shockwave lithotripsy of a right upper pole renal calculus.  He has a few small remaining fragments which may eventually pass.  I recommended a follow-up visit with KUB in 3 months.  Instructed to call earlier for development of renal colic.  He was interested in pursuing a metabolic evaluation and an order was sent to [facility] for a 24-hour urine study and blood work.   Return in about 3 months (around 12/27/2018) for KUB.   Riki Altes, MD  [facility] [address], [address] [place], [place] [address] [phone]

## 2018-09-26 NOTE — Patient Instructions (Signed)
Dietary Guidelines to Help Prevent Kidney Stones  Kidney stones are deposits of minerals and salts that form inside your kidneys. Your risk of developing kidney stones may be greater depending on your diet, your lifestyle, the medicines you take, and whether you have certain medical conditions. Most people can reduce their chances of developing kidney stones by following the instructions below. Depending on your overall health and the type of kidney stones you tend to develop, your dietitian may give you more specific instructions.  What are tips for following this plan?  Reading food labels   Choose foods with "no salt added" or "low-salt" labels. Limit your sodium intake to less than 1500 mg per day.   Choose foods with calcium for each meal and snack. Try to eat about 300 mg of calcium at each meal. Foods that contain 200-500 mg of calcium per serving include:  ? 8 oz (237 ml) of milk, fortified nondairy milk, and fortified fruit juice.  ? 8 oz (237 ml) of kefir, yogurt, and soy yogurt.  ? 4 oz (118 ml) of tofu.  ? 1 oz of cheese.  ? 1 cup (300 g) of dried figs.  ? 1 cup (91 g) of cooked broccoli.  ? 1-3 oz can of sardines or mackerel.   Most people need 1000 to 1500 mg of calcium each day. Talk to your dietitian about how much calcium is recommended for you.  Shopping   Buy plenty of fresh fruits and vegetables. Most people do not need to avoid fruits and vegetables, even if they contain nutrients that may contribute to kidney stones.   When shopping for convenience foods, choose:  ? Whole pieces of fruit.  ? Premade salads with dressing on the side.  ? Low-fat fruit and yogurt smoothies.   Avoid buying frozen meals or prepared deli foods.   Look for foods with live cultures, such as yogurt and kefir.  Cooking   Do not add salt to food when cooking. Place a salt shaker on the table and allow each person to add his or her own salt to taste.   Use vegetable protein, such as beans, textured vegetable  protein (TVP), or tofu instead of meat in pasta, casseroles, and soups.  Meal planning     Eat less salt, if told by your dietitian. To do this:  ? Avoid eating processed or premade food.  ? Avoid eating fast food.   Eat less animal protein, including cheese, meat, poultry, or fish, if told by your dietitian. To do this:  ? Limit the number of times you have meat, poultry, fish, or cheese each week. Eat a diet free of meat at least 2 days a week.  ? Eat only one serving each day of meat, poultry, fish, or seafood.  ? When you prepare animal protein, cut pieces into small portion sizes. For most meat and fish, one serving is about the size of one deck of cards.   Eat at least 5 servings of fresh fruits and vegetables each day. To do this:  ? Keep fruits and vegetables on hand for snacks.  ? Eat 1 piece of fruit or a handful of berries with breakfast.  ? Have a salad and fruit at lunch.  ? Have two kinds of vegetables at dinner.   Limit foods that are high in a substance called oxalate. These include:  ? Spinach.  ? Rhubarb.  ? Beets.  ? Potato chips and french fries.  ? Nuts.     If you regularly take a diuretic medicine, make sure to eat at least 1-2 fruits or vegetables high in potassium each day. These include:  ? Avocado.  ? Banana.  ? Orange, prune, carrot, or tomato juice.  ? Baked potato.  ? Cabbage.  ? Beans and split peas.  General instructions     Drink enough fluid to keep your urine clear or pale yellow. This is the most important thing you can do.   Talk to your health care provider and dietitian about taking daily supplements. Depending on your health and the cause of your kidney stones, you may be advised:  ? Not to take supplements with vitamin C.  ? To take a calcium supplement.  ? To take a daily probiotic supplement.  ? To take other supplements such as magnesium, fish oil, or vitamin B6.   Take all medicines and supplements as told by your health care provider.   Limit alcohol intake to no  more than 1 drink a day for nonpregnant women and 2 drinks a day for men. One drink equals 12 oz of beer, 5 oz of wine, or 1 oz of hard liquor.   Lose weight if told by your health care provider. Work with your dietitian to find strategies and an eating plan that works best for you.  What foods are not recommended?  Limit your intake of the following foods, or as told by your dietitian. Talk to your dietitian about specific foods you should avoid based on the type of kidney stones and your overall health.  Grains  Breads. Bagels. Rolls. Baked goods. Salted crackers. Cereal. Pasta.  Vegetables  Spinach. Rhubarb. Beets. Canned vegetables. Pickles. Olives.  Meats and other protein foods  Nuts. Nut butters. Large portions of meat, poultry, or fish. Salted or cured meats. Deli meats. Hot dogs. Sausages.  Dairy  Cheese.  Beverages  Regular soft drinks. Regular vegetable juice.  Seasonings and other foods  Seasoning blends with salt. Salad dressings. Canned soups. Soy sauce. Ketchup. Barbecue sauce. Canned pasta sauce. Casseroles. Pizza. Lasagna. Frozen meals. Potato chips. French fries.  Summary   You can reduce your risk of kidney stones by making changes to your diet.   The most important thing you can do is drink enough fluid. You should drink enough fluid to keep your urine clear or pale yellow.   Ask your health care provider or dietitian how much protein from animal sources you should eat each day, and also how much salt and calcium you should have each day.  This information is not intended to replace advice given to you by your health care provider. Make sure you discuss any questions you have with your health care provider.  Document Released: 10/20/2010 Document Revised: 06/05/2016 Document Reviewed: 06/05/2016  Elsevier Interactive Patient Education  2019 [employer]

## 2018-09-30 ENCOUNTER — Telehealth: Payer: Self-pay | Admitting: *Deleted

## 2018-09-30 NOTE — Telephone Encounter (Signed)
Called the patient as a COVID pre-screening for his appointment on 3/27. His wife has requested that we call back Thursday around 10 am.

## 2018-10-02 NOTE — Telephone Encounter (Signed)
TELEPHONE CALL NOTE  Shawn Sherman has been deemed a candidate for a follow-up tele-health visit to limit community exposure during the Covid-19 pandemic. I spoke with the patient via phone to ensure availability of phone/video source, confirm preferred email & phone number, and discuss instructions and expectations.  I reminded Shawn Sherman to be prepared with any vital sign and/or heart rhythm information that could potentially be obtained via home monitoring, at the time of his visit. I reminded Shawn Sherman to expect an e-mail containing a link for their video-based visit approximately 15 minutes before his visit, or alternatively, a phone call at the time of his visit if his visit is planned to be a phone encounter.  STAFF MUST READ CONSENT VERBATIM TO PATIENT BELOW - Did the patient verbally consent to treatment as below? Yes  Sandi Mariscal, RN 10/02/2018 1:39 PM   CONSENT FOR TELE-HEALTH VISIT - PLEASE REVIEW  I hereby voluntarily request, consent and authorize [facility] and its employed or contracted physicians, physician assistants, nurse practitioners or other licensed health care professionals (the Practitioner), to provide me with telemedicine health care services (the "Services") as deemed necessary by the treating Practitioner. I acknowledge and consent to receive the Services by the Practitioner via telemedicine. I understand that the telemedicine visit will involve communicating with the Practitioner through live audiovisual communication technology and the disclosure of certain medical information by electronic transmission. I acknowledge that I have been given the opportunity to request an in-person assessment or other available alternative prior to the telemedicine visit and am voluntarily participating in the telemedicine visit.  I understand that I have the right to withhold or withdraw my consent to the use of telemedicine in the course of my care at any time,  without affecting my right to future care or treatment, and that the Practitioner or I may terminate the telemedicine visit at any time. I understand that I have the right to inspect all information obtained and/or recorded in the course of the telemedicine visit and may receive copies of available information for a reasonable fee.  I understand that some of the potential risks of receiving the Services via telemedicine include:  Marland Kitchen Delay or interruption in medical evaluation due to technological equipment failure or disruption; . Information transmitted may not be sufficient (e.g. poor resolution of images) to allow for appropriate medical decision making by the Practitioner; and/or  . In rare instances, security protocols could fail, causing a breach of personal health information.  Furthermore, I acknowledge that it is my responsibility to provide information about my medical history, conditions and care that is complete and accurate to the best of my ability. I acknowledge that Practitioner's advice, recommendations, and/or decision may be based on factors not within their control, such as incomplete or inaccurate data provided by me or distortions of diagnostic images or specimens that may result from electronic transmissions. I understand that the practice of medicine is not an exact science and that Practitioner makes no warranties or guarantees regarding treatment outcomes. I acknowledge that I will receive a copy of this consent concurrently upon execution via email to the email address I last provided but may also request a printed copy by calling the office of [facility].    I understand that my insurance will be billed for this visit.   I have read or had this consent read to me. . I understand the contents of this consent, which adequately explains the benefits and  risks of the Services being provided via telemedicine.  . I have been provided ample opportunity to ask questions regarding  this consent and the Services and have had my questions answered to my satisfaction. . I give my informed consent for the services to be provided through the use of telemedicine in my medical care  By participating in this telemedicine visit I agree to the above.

## 2018-10-03 ENCOUNTER — Ambulatory Visit: Payer: 59 | Admitting: Cardiovascular Disease

## 2018-10-03 ENCOUNTER — Other Ambulatory Visit: Payer: Self-pay

## 2018-10-03 ENCOUNTER — Telehealth (INDEPENDENT_AMBULATORY_CARE_PROVIDER_SITE_OTHER): Payer: 59 | Admitting: Cardiovascular Disease

## 2018-10-03 DIAGNOSIS — R0989 Other specified symptoms and signs involving the circulatory and respiratory systems: Secondary | ICD-10-CM | POA: Diagnosis not present

## 2018-10-03 DIAGNOSIS — Z7982 Long term (current) use of aspirin: Secondary | ICD-10-CM

## 2018-10-03 DIAGNOSIS — E785 Hyperlipidemia, unspecified: Secondary | ICD-10-CM | POA: Diagnosis not present

## 2018-10-03 DIAGNOSIS — I779 Disorder of arteries and arterioles, unspecified: Secondary | ICD-10-CM

## 2018-10-03 DIAGNOSIS — Z79899 Other long term (current) drug therapy: Secondary | ICD-10-CM

## 2018-10-03 DIAGNOSIS — I739 Peripheral vascular disease, unspecified: Secondary | ICD-10-CM

## 2018-10-03 DIAGNOSIS — F17220 Nicotine dependence, chewing tobacco, uncomplicated: Secondary | ICD-10-CM

## 2018-10-03 DIAGNOSIS — I10 Essential (primary) hypertension: Secondary | ICD-10-CM

## 2018-10-03 NOTE — Patient Instructions (Signed)
Medication Instructions:  Continue same medications If you need a refill on your cardiac medications before your next appointment, please call your pharmacy.   Lab work: None If you have labs (blood work) drawn today and your tests are completely normal, you will receive your results only by: Marland Kitchen MyChart Message (if you have MyChart) OR . A paper copy in the mail If you have any lab test that is abnormal or we need to change your treatment, we will call you to review the results.  Testing/Procedures: Carotid Doppler has already been ordered and can be done in 3 months  Follow-Up: At [facility], you and your health needs are our priority.  As part of our continuing mission to provide you with exceptional heart care, we have created designated Provider Care Teams.  These Care Teams include your primary Cardiologist (physician) and Advanced Practice Providers (APPs -  Physician Assistants and Nurse Practitioners) who all work together to provide you with the care you need, when you need it. You will need a follow up appointment in 3 months.  Please call our office 2 months in advance to schedule this appointment.  You may see Lorine Bears, MD or one of the following Advanced Practice Providers on your designated Care Team:   Nicolasa Ducking, NP Eula Listen, PA-C . Marisue Ivan, PA-C  Any Other Special Instructions Will Be Listed Below (If Applicable).

## 2018-10-03 NOTE — Progress Notes (Signed)
Virtual Visit via Telephone Note    Evaluation Performed:  Follow-up visit  This visit type was conducted due to national recommendations for restrictions regarding the COVID-19 Pandemic (e.g. social distancing).  This format is felt to be most appropriate for this patient at this time.  All issues noted in this document were discussed and addressed.  No physical exam was performed (except for noted visual exam findings with Video Visits).  Please refer to the patient's chart (MyChart message for video visits and phone note for telephone visits) for the patient's consent to telehealth for [facility].  Date:  10/03/2018   ID:  Shawn Sherman, DOB 1955-09-18, MRN [id number]  Patient Location:  home  Provider location:   [facility]  PCP:  Anola Gurney, PA  Cardiologist:  Lorine Bears, MD  Electrophysiologist:  None   Chief Complaint: Follow-up visit  History of Present Illness:    Shawn Sherman is a 63 y.o. male who presents via audio/video conferencing for a telehealth visit today.  He has known history of essential hypertension, hyperlipidemia and tobacco use.  He chews tobacco but does not smoke.  He was seen recently for atypical chest pain.  EKG was unremarkable.  He was noted to have left carotid bruit.  I referred him for a treadmill Myoview.  He had ST depression with exercise but reports no symptoms whatsoever.  Perfusion imaging was normal.  The patient was cleared to have lithotripsy done which was done without complications.  Carotid Doppler has not been performed yet.  The patient reports no recurrent chest pain or shortness of breath.  He is doing well overall.  The patient does not symptoms concerning for COVID-19 infection (fever, chills, cough, or new shortness of breath).    Prior CV studies:   The following studies were reviewed today:  Recent stress test.  Past Medical History:  Diagnosis Date  . Anginal pain (HCC) 08/2018   recent  atypical chest pain.  r/o for MI  . Arthritis   . Carotid bruit present 08/2018   left  . Essential hypertension 03/29/2017  . GERD (gastroesophageal reflux disease)   . History of kidney stones   . Hyperlipidemia    Past Surgical History:  Procedure Laterality Date  . ANTERIOR LAT LUMBAR FUSION Right 01/01/2013   Procedure: lumbar three-four extreme lateral interbody fusion;  Surgeon: Reinaldo Meeker, MD;  Location: [facility];  Service: Neurosurgery;  Laterality: Right;  Lumbar three-four extreme lateral interbody fusion    . BACK SURGERY  08  . ESOPHAGOGASTRODUODENOSCOPY (EGD) WITH PROPOFOL N/A 07/05/2015   Procedure: ESOPHAGOGASTRODUODENOSCOPY (EGD) WITH PROPOFOL;  Surgeon: Midge Minium, MD;  Location: [facility];  Service: Endoscopy;  Laterality: N/A;  . EXTRACORPOREAL SHOCK WAVE LITHOTRIPSY Right 09/11/2018   Procedure: EXTRACORPOREAL SHOCK WAVE LITHOTRIPSY (ESWL);  Surgeon: Riki Altes, MD;  Location: [facility];  Service: Urology;  Laterality: Right;  . LUMBAR PERCUTANEOUS PEDICLE SCREW 1 LEVEL N/A 01/01/2013   Procedure: lumbar three-four percutaneous pedicle screws ;  Surgeon: Reinaldo Meeker, MD;  Location: [facility];  Service: Neurosurgery;  Laterality: N/A;  lumbar three-four percutaneous pedicle screws     Current Meds  Medication Sig  . aspirin 81 MG chewable tablet Chew 81 mg by mouth daily.     Allergies:   Patient has no known allergies.   Social History   Tobacco Use  . Smoking status: Never Smoker  . Smokeless tobacco: Current User    Types:  Snuff  Substance Use Topics  . Alcohol use: No  . Drug use: No     Family Hx: The patient's family history is not on file.  ROS:   Please see the history of present illness.     All other systems reviewed and are negative.   Labs/Other Tests and Data Reviewed:    Recent Labs: 02/13/2018: ALT 30 08/05/2018: BUN 13; Creatinine, Ser 0.79; Hemoglobin 16.0; Platelets 195; Potassium 4.1; Sodium 136   Recent  Lipid Panel Lab Results  Component Value Date/Time   CHOL 143 04/19/2017 11:43 AM   CHOL 289 (H) 03/07/2017 09:45 AM   TRIG 88 04/19/2017 11:43 AM   HDL 53 04/19/2017 11:43 AM   HDL 46 03/07/2017 09:45 AM   CHOLHDL 2.7 04/19/2017 11:43 AM   LDLCALC 73 04/19/2017 11:43 AM    Wt Readings from Last 3 Encounters:  09/26/18 179 lb (81.2 kg)  09/11/18 179 lb 0.2 oz (81.2 kg)  08/29/18 179 lb (81.2 kg)     Exam:    Vital Signs:  There were no vitals taken for this visit.    ASSESSMENT & PLAN:    1.  Atypical chest pain with recent borderline abnormal nuclear stress test by EKG criteria but not perfusion imaging: Fortunately, the patient reports no further episodes of chest pain there is nothing to suggest ongoing anginal symptoms.  Thus, I recommend continuing medical therapy for now with aspirin, statin and blood pressure control.  If he requires another lithotripsy, aspirin can be held 7 days before.  2.  Left carotid bruit: Asymptomatic overall.  Will defer carotid Doppler for few months down the road given that this is elective.  3.  Hyperlipidemia: He is currently on high-dose atorvastatin.  Most recent LDL was 73.  4.  Tobacco use: He chews but does not smoke.  I did not discuss with him today.  COVID-19 Education: The signs and symptoms of COVID-19 were discussed with the patient and how to seek care for testing (follow up with PCP or arrange E-visit).  The importance of social distancing was discussed today.  Patient Risk:   After full review of this patients clinical status, I feel that they are at least moderate risk at this time.  Time:   Today, I have spent 18 minutes with the patient with telehealth technology discussing .     Medication Adjustments/Labs and Tests Ordered: Current medicines are reviewed at length with the patient today.  Concerns regarding medicines are outlined above.  Tests Ordered: No orders of the defined types were placed in this encounter.   Medication Changes: No orders of the defined types were placed in this encounter.   Disposition:  Follow up in 3 month(s)  Signed, Lorine Bears, MD  10/03/2018 2:06 PM    [facility]

## 2018-10-06 NOTE — Progress Notes (Signed)
Will reach out when July schedule is open

## 2018-10-06 NOTE — Telephone Encounter (Signed)
Will readh out when July schedule open patient needs 3 m with carotid

## 2018-10-16 NOTE — Telephone Encounter (Signed)
Recall entered patient to call when ready to schedule  Removing from COVID pool

## 2018-11-12 ENCOUNTER — Ambulatory Visit: Payer: Self-pay | Admitting: Internal Medicine

## 2018-11-28 ENCOUNTER — Ambulatory Visit: Payer: Self-pay | Admitting: Cardiovascular Disease

## 2018-12-24 ENCOUNTER — Other Ambulatory Visit: Payer: Self-pay | Admitting: Urology

## 2018-12-24 DIAGNOSIS — N200 Calculus of kidney: Secondary | ICD-10-CM

## 2018-12-26 ENCOUNTER — Ambulatory Visit
Admission: RE | Admit: 2018-12-26 | Discharge: 2018-12-26 | Disposition: A | Discharge status: Home / Self Care | Payer: 59 | Source: Ambulatory Visit | Attending: Urology | Admitting: Urology

## 2018-12-26 ENCOUNTER — Other Ambulatory Visit: Payer: Self-pay

## 2018-12-26 DIAGNOSIS — N200 Calculus of kidney: Secondary | ICD-10-CM

## 2018-12-29 ENCOUNTER — Other Ambulatory Visit: Payer: Self-pay

## 2018-12-29 ENCOUNTER — Telehealth (INDEPENDENT_AMBULATORY_CARE_PROVIDER_SITE_OTHER): Payer: 59 | Admitting: Urology

## 2018-12-29 DIAGNOSIS — N200 Calculus of kidney: Secondary | ICD-10-CM | POA: Diagnosis not present

## 2018-12-29 NOTE — Progress Notes (Signed)
Virtual Visit via Telephone Note  I connected with Shawn Sherman on 12/29/18 at 11:30 AM EDT by telephone and verified that I am speaking with the correct person using two identifiers.  Location: Patient: Home Provider: Office   I discussed the limitations, risks, security and privacy concerns of performing an evaluation and management service by telephone and the availability of in person appointments. I also discussed with the patient that there may be a patient responsible charge related to this service. The patient expressed understanding and agreed to proceed.   History of Present Illness: 63 year old male presents for a 71-monthfollow-up of nephrolithiasis via telephone secondary to COVID-19 pandemic.  He was unable to do a video visit.  He is status post shockwave lithotripsy of a right upper pole renal calculus in March 2020.  He had no postoperative problems and passed several small fragments.  He did have a cluster of 1-2 mm calcifications overlying the right upper pole.  He remains asymptomatic.  Repeat KUB does show persistent small calcifications overlying the upper portion of the right renal outline as well as a left upper pole renal calculus.   Observations/Objective: N/A  Assessment and Plan: 63year old male with small residual fragments status post shockwave lithotripsy.  He is asymptomatic.  He was interested in pursuing a metabolic evaluation however states he never received the kit from [facility]  Will re-request.  Follow Up Instructions: 689-monthollow-up with KUB.   I discussed the assessment and treatment plan with the patient. The patient was provided an opportunity to ask questions and all were answered. The patient agreed with the plan and demonstrated an understanding of the instructions.   The patient was advised to call back or seek an in-person evaluation if the symptoms worsen or if the condition fails to improve as anticipated.  I provided 10 minutes  of non-face-to-face time during this encounter.   ScAbbie SonsMD

## 2019-01-02 ENCOUNTER — Ambulatory Visit: Payer: 59 | Admitting: Urology

## 2019-01-19 ENCOUNTER — Other Ambulatory Visit: Payer: Self-pay | Admitting: Family Medicine

## 2019-01-19 DIAGNOSIS — K209 Esophagitis, unspecified without bleeding: Secondary | ICD-10-CM

## 2019-01-19 MED ORDER — ESOMEPRAZOLE MAGNESIUM 40 MG PO CPDR: DELAYED_RELEASE_CAPSULE | ORAL | 0 refills | Status: DC

## 2019-01-19 NOTE — Telephone Encounter (Signed)
Please schedule for CPE and follow up, needs to establish with new provider and hasn't been seen in a year for chronic issues.

## 2019-01-19 NOTE — Telephone Encounter (Signed)
[facility] faxed refill request for the following medications:  esomeprazole (NEXIUM) 40 MG capsule   Please advise.

## 2019-01-19 NOTE — Telephone Encounter (Signed)
Patient advised as below.  

## 2019-01-22 NOTE — Progress Notes (Signed)
SCHEDULED

## 2019-01-23 ENCOUNTER — Telehealth: Payer: Self-pay | Admitting: Cardiovascular Disease

## 2019-01-23 NOTE — Telephone Encounter (Signed)

## 2019-01-26 ENCOUNTER — Ambulatory Visit (INDEPENDENT_AMBULATORY_CARE_PROVIDER_SITE_OTHER): Payer: 59

## 2019-01-26 ENCOUNTER — Ambulatory Visit
Admission: RE | Admit: 2019-01-26 | Discharge: 2019-01-26 | Disposition: A | Discharge status: Home / Self Care | Payer: 59 | Source: Ambulatory Visit | Attending: Cardiovascular Disease | Admitting: Cardiovascular Disease

## 2019-01-26 ENCOUNTER — Telehealth: Payer: Self-pay | Admitting: *Deleted

## 2019-01-26 ENCOUNTER — Encounter: Payer: Self-pay | Admitting: Cardiology

## 2019-01-26 ENCOUNTER — Other Ambulatory Visit: Payer: Self-pay

## 2019-01-26 DIAGNOSIS — I779 Disorder of arteries and arterioles, unspecified: Secondary | ICD-10-CM

## 2019-01-26 DIAGNOSIS — I739 Peripheral vascular disease, unspecified: Secondary | ICD-10-CM | POA: Insufficient documentation

## 2019-01-26 DIAGNOSIS — R0989 Other specified symptoms and signs involving the circulatory and respiratory systems: Secondary | ICD-10-CM

## 2019-01-26 LAB — POCT I-STAT CREATININE: Creatinine, Ser: 0.7 mg/dL (ref 0.61–1.24)

## 2019-01-26 MED ORDER — IOHEXOL 350 MG/ML SOLN
75.0000 mL | Freq: Once | INTRAVENOUS | Status: AC | PRN
  Administered 2019-01-26: 75 mL via INTRAVENOUS

## 2019-01-26 NOTE — Telephone Encounter (Signed)
Patient here having carotid ultrasound. Preliminary results taken to Dr Rockey Situ by technologist. Dr Rockey Situ advises for patient to have STAT CTA carotid artery/neck today or tomorrow. No precert needed per billing department.  Patient agreeable to CTA today. After speaking with scheduling, patient advised to go up to [facility] and check in at the desk.

## 2019-01-26 NOTE — Progress Notes (Signed)
Patient had carotid duplex, found to have bilateral ICA stenosis >80%.  C/o left arm tingling for several days.  Being scheduled for CT Angiogram of the neck, and follow up with [facility].

## 2019-01-26 NOTE — Telephone Encounter (Signed)
Preliminary results of CTA neck called from CT. Advised I will make Dr Rockey Situ aware.  Dr Rockey Situ reviewed and advised for urgent referral to [facility] concerning the results. Called [facility] and they will call patient to schedule once they receive referral in system. Referral entered as urgent.  Called patient and notified him to call [facility] on Wednesday if he has not heard anything. He was appreciative.

## 2019-01-28 ENCOUNTER — Other Ambulatory Visit: Payer: Self-pay

## 2019-01-28 DIAGNOSIS — I6523 Occlusion and stenosis of bilateral carotid arteries: Secondary | ICD-10-CM

## 2019-01-29 ENCOUNTER — Ambulatory Visit: Payer: 59 | Admitting: Nurse Practitioner

## 2019-01-29 ENCOUNTER — Encounter: Payer: Self-pay | Admitting: Nurse Practitioner

## 2019-01-29 NOTE — Telephone Encounter (Signed)
-----   Message from Wellington Hampshire, MD sent at 01/28/2019  7:51 AM EDT ----- Severe carotid stenosis. This was addressed by Dr. Rockey Situ and the patient was referred to [facility]. Please make sure the patient is scheduled urgently.

## 2019-01-29 NOTE — Telephone Encounter (Signed)
Attempted to contact [facility]  to ck the status of the pt urgent referral. [place] [phone]. Their answering service sts that you are not to leave a message to ck the status of an referral if it has been less than 5 business days. The ref was placed on 01/26/19. Will f/u with them next week if the pt appt is not scheduled.

## 2019-01-29 NOTE — Telephone Encounter (Signed)
Pt No showed for his appt with Ignacia Bayley, NP today. Contacted the pt to check on him.  Spoke with the pt. Pt sts that he thought that his appt was cancelled as he is to have an appt with [facility]. Pt sts that he contacted [facility] and was told that they are working through their ref and would need to call him back with an appt. Offered to place the ref with [facility] in [place] to help facilitate the consult quicker. Pt sts that he would rather not go to [place]. Adv the pt that I will attempt again to talk with someone at [facility] to help expedite the appt. Adv the pt that If I cannot I would recommend the ref be placed with [facility] due to the urgency of his dx. Adv the pt that I will attempt again to contact [facility] and then call him back.  Pt is asymptomatic. Adv the pt to call 911 if any signs of stroke like symptoms or passing out develops. Pt verbalized understanding.  Called [facility] lmtcb on their nurse line and referral line.

## 2019-01-29 NOTE — Telephone Encounter (Signed)
Called [facility] [place] and spoke with [place]. Kela sts that the person that handles the referrals has left for the day. She will leave a msg for her to call our office in the morning.  Contacted [facility] in [place] and spoke with [place]. Adonis Brook sts that they can possibly get the pt in tomorrow 7/24 or Mon 7/27. Adonis Brook sts that she will take care of it and get the pt in asap.  Called the pt and spoke with the pt wife. Pt wife sts that the pt is currently at work and will not be available by phone until after 7:30pm. Adv the pt wife that we have been unable to speak with [facility], but [facility] can get the pt seen possibly tomorrow or Monday 02/02/19. Adv her that [facility] will contact them to schedule. Stressed to the pt wife the urgency. Provided [facility] 's address and telephone number. Pt wife will let the pt know when he returns home. Adv her to have the pt contact our office if any questions or concerns. Pt wife verbalized understanding.

## 2019-01-30 ENCOUNTER — Other Ambulatory Visit: Payer: Self-pay

## 2019-01-30 ENCOUNTER — Encounter (INDEPENDENT_AMBULATORY_CARE_PROVIDER_SITE_OTHER): Payer: Self-pay | Admitting: Vascular Surgery

## 2019-01-30 ENCOUNTER — Ambulatory Visit (INDEPENDENT_AMBULATORY_CARE_PROVIDER_SITE_OTHER): Payer: 59 | Admitting: Vascular Surgery

## 2019-01-30 VITALS — BP 154/78 | HR 71 | Resp 16 | Ht 70.0 in | Wt 175.8 lb

## 2019-01-30 DIAGNOSIS — I6529 Occlusion and stenosis of unspecified carotid artery: Secondary | ICD-10-CM | POA: Insufficient documentation

## 2019-01-30 DIAGNOSIS — Z79899 Other long term (current) drug therapy: Secondary | ICD-10-CM

## 2019-01-30 DIAGNOSIS — E785 Hyperlipidemia, unspecified: Secondary | ICD-10-CM

## 2019-01-30 DIAGNOSIS — F17290 Nicotine dependence, other tobacco product, uncomplicated: Secondary | ICD-10-CM | POA: Diagnosis not present

## 2019-01-30 DIAGNOSIS — I10 Essential (primary) hypertension: Secondary | ICD-10-CM | POA: Diagnosis not present

## 2019-01-30 DIAGNOSIS — I6523 Occlusion and stenosis of bilateral carotid arteries: Secondary | ICD-10-CM

## 2019-01-30 NOTE — Progress Notes (Signed)
Patient ID: Shawn RakersCalvin J Sherman, male   DOB: 1956/01/31, 63 y.o.   MRN: [id number]  Chief Complaint  Patient presents with   New Patient (Initial Visit)    ref Mariah MillingGollan for carotid stenosis    HPI Shawn Sherman is a 63 y.o. male.  I am asked to see the patient by Dr. Mariah MillingGollan for evaluation of carotid stenosis.  The patient reports some dizziness and lightheadedness but no focal neurologic symptoms.  He denies any previous history of stroke or TIA to his knowledge.  It was noted by his cardiologist that he had a left carotid bruit present and he recently underwent a carotid ultrasound and a carotid CT angiogram which I have independently reviewed.  The duplex have velocities that would fall in the 80 to 99% range bilaterally.  The CT angiogram shows a string sign in the right carotid artery consistent with a greater than 90% stenosis.  The official report was of a 60% left carotid artery stenosis but I believe this is much closer to a high-grade range of 75 to 80%.  Given these findings, he is referred for further evaluation and treatment   Past Medical History:  Diagnosis Date   Anginal pain (HCC) 08/2018   a. 08/2018 - atyp c/p and neg trop; b. 08/2018 MV: EF 57%, inferolateral horizontal ST depression w/o ischemia or infarct.   Arthritis    Carotid arterial disease (HCC)    a. 01/2019 Carotid U/S: RICA 80-90, RCCA <50, RECA>50, LICA 80-99, LCCA>50, LECA>50; b. 01/2019 CT Head/Neck: RICA string sign in bulb - lumen <901mm. Sev prox RECA dzs. LICA 60 @ dist bulb. R vert 50-70, L vert 30-50.   Carotid bruit present 08/2018   left   Chewing tobacco nicotine dependence    Essential hypertension 03/29/2017   GERD (gastroesophageal reflux disease)    History of kidney stones    Hyperlipidemia     Past Surgical History:  Procedure Laterality Date   ANTERIOR LAT LUMBAR FUSION Right 01/01/2013   Procedure: lumbar three-four extreme lateral interbody fusion;  Surgeon: Reinaldo Meekerandy O Kritzer, MD;   Location: [facility];  Service: Neurosurgery;  Laterality: Right;  Lumbar three-four extreme lateral interbody fusion     BACK SURGERY  08   ESOPHAGOGASTRODUODENOSCOPY (EGD) WITH PROPOFOL N/A 07/05/2015   Procedure: ESOPHAGOGASTRODUODENOSCOPY (EGD) WITH PROPOFOL;  Surgeon: Midge Miniumarren Wohl, MD;  Location: [facility];  Service: Endoscopy;  Laterality: N/A;   EXTRACORPOREAL SHOCK WAVE LITHOTRIPSY Right 09/11/2018   Procedure: EXTRACORPOREAL SHOCK WAVE LITHOTRIPSY (ESWL);  Surgeon: Riki AltesStoioff, Scott C, MD;  Location: [facility];  Service: Urology;  Laterality: Right;   LUMBAR PERCUTANEOUS PEDICLE SCREW 1 LEVEL N/A 01/01/2013   Procedure: lumbar three-four percutaneous pedicle screws ;  Surgeon: Reinaldo Meekerandy O Kritzer, MD;  Location: [facility];  Service: Neurosurgery;  Laterality: N/A;  lumbar three-four percutaneous pedicle screws    Family History  Problem Relation Age of Onset   Cancer Mother    Cancer Father    COPD Sister   No bleeding or clotting disorders  Social History Social History   Tobacco Use   Smoking status: Never Smoker   Smokeless tobacco: Current User    Types: Snuff  Substance Use Topics   Alcohol use: No   Drug use: No    No Known Allergies  Current Outpatient Medications  Medication Sig Dispense Refill   amLODipine (NORVASC) 10 MG tablet Take 1 tablet (10 mg total) by mouth daily. 90 tablet 3  aspirin 81 MG chewable tablet Chew 81 mg by mouth daily.     atorvastatin (LIPITOR) 80 MG tablet Take 1 tablet (80 mg total) by mouth daily. 90 tablet 3   esomeprazole (NEXIUM) 40 MG capsule One daily 90 capsule 0   No current facility-administered medications for this visit.       REVIEW OF SYSTEMS (Negative unless checked)  Constitutional: [] Weight loss  [] Fever  [] Chills Cardiac: [] Chest pain   [] Chest pressure   [] Palpitations   [] Shortness of breath when laying flat   [] Shortness of breath at rest   [] Shortness of breath with exertion. Vascular:  [] Pain  in legs with walking   [] Pain in legs at rest   [] Pain in legs when laying flat   [] Claudication   [] Pain in feet when walking  [] Pain in feet at rest  [] Pain in feet when laying flat   [] History of DVT   [] Phlebitis   [] Swelling in legs   [] Varicose veins   [] Non-healing ulcers Pulmonary:   [] Uses home oxygen   [] Productive cough   [] Hemoptysis   [] Wheeze  [] COPD   [] Asthma Neurologic:  [x] Dizziness  [] Blackouts   [] Seizures   [] History of stroke   [] History of TIA  [] Aphasia   [] Temporary blindness   [] Dysphagia   [] Weakness or numbness in arms   [] Weakness or numbness in legs Musculoskeletal:  [] Arthritis   [] Joint swelling   [] Joint pain   [] Low back pain Hematologic:  [] Easy bruising  [] Easy bleeding   [] Hypercoagulable state   [] Anemic  [] Hepatitis Gastrointestinal:  [] Blood in stool   [] Vomiting blood  [x] Gastroesophageal reflux/heartburn   [] Abdominal pain Genitourinary:  [] Chronic kidney disease   [] Difficult urination  [] Frequent urination  [] Burning with urination   [] Hematuria Skin:  [] Rashes   [] Ulcers   [] Wounds Psychological:  [] History of anxiety   []  History of major depression.    Physical Exam BP (!) 154/78 (BP Location: Right Arm)    Pulse 71    Resp 16    Ht 5\' 10"  (1.778 m)    Wt 175 lb 12.8 oz (79.7 kg)    BMI 25.22 kg/m  Gen:  WD/WN, NAD Head: [place]/AT, No temporalis wasting.  Ear/Nose/Throat: Hearing grossly intact, nares w/o erythema or drainage, oropharynx w/o Erythema/Exudate Eyes: Conjunctiva clear, sclera non-icteric  Neck: trachea midline.  Bilateral carotid bruits are present louder on the left. Pulmonary:  Good air movement, clear to auscultation bilaterally.  Cardiac: RRR, normal S1, S2 Vascular:  Vessel Right Left  Radial Palpable Palpable                                   Gastrointestinal: soft, non-tender/non-distended.  Musculoskeletal: M/S 5/5 throughout.  Extremities without ischemic changes.  No deformity or atrophy.  No edema. Neurologic:  Sensation grossly intact in extremities.  Symmetrical.  Speech is fluent. Motor exam as listed above. Psychiatric: Judgment intact, Mood & affect appropriate for pt's clinical situation. Dermatologic: No rashes or ulcers noted.  No cellulitis or open wounds.   Radiology Ct Angio Neck W Or Wo Contrast  Result Date: 01/26/2019 CLINICAL DATA:  Carotid stenosis. EXAM: CT ANGIOGRAPHY NECK TECHNIQUE: Multidetector CT imaging of the neck was performed using the standard protocol during bolus administration of intravenous contrast. Multiplanar CT image reconstructions and MIPs were obtained to evaluate the vascular anatomy. Carotid stenosis measurements (when applicable) are obtained utilizing NASCET criteria, using the distal internal carotid  diameter as the denominator. CONTRAST:  75mL OMNIPAQUE IOHEXOL 350 MG/ML SOLN COMPARISON:  None. FINDINGS: Aortic arch: Atherosclerosis of the aortic arch but no aneurysm or dissection. Branching pattern is normal without origin stenosis. Right carotid system: Common carotid artery shows some atherosclerotic plaque with soft plaque affecting the distal 1 cm of the vessel proximal to the bifurcation but no true stenosis in that region. At the carotid bifurcation and ICA bulb, there is severe soft and calcified plaque. There is near occlusion in the right ICA bulb. There is a string sign, patent lumen being nearly an measurable. Cervical ICA is patent but shows flow reduction related diminished size. Severe stenosis also of the proximal external carotid artery with luminal diameter of 1 mm. Left carotid system: Common carotid artery is patent to the bifurcation. Soft plaque affecting the distal common carotid artery, carotid bifurcation and ICA bulb. Distal common carotid artery just proximal to the bifurcation shows a minimal diameter of 2.5 mm. In the distal bulb level, minimal luminal diameter is 2 mm. Compared to a more distal cervical ICA diameter of 5 mm, this indicates a  60% stenosis. Stenosis also noted of the proximal external carotid artery with luminal diameter of 2 mm. Vertebral arteries: Calcified plaque at the right vertebral artery origin with a 50-70% stenosis. Calcified plaque at the left vertebral artery origin with a 30-50% stenosis. Beyond that, both vertebral arteries are patent through the cervical region to the foramen magnum and basilar artery. Skeleton: Ordinary cervical spondylosis. Other neck: No mass or lymphadenopathy. Upper chest: Normal IMPRESSION: Right carotid system: Soft plaque affecting the common carotid artery, carotid bifurcation region and ICA bulb. Calcified plaque in the ICA bulb region. Very severe stenosis in the ICA bulb region with string sign. Patent lumen is almost unmeasurable, less than 1 mm. Small size of the distal cervical ICA likely secondary to flow reduction. Severe stenosis also of the proximal ECA on this side. Note that the soft plaque does affect the distal common carotid artery as well. Left carotid system: Soft plaque affecting the distal common carotid artery, carotid bifurcation and ICA bulb region. Stenosis of the distal common carotid artery with diameter as narrow as 2.5 mm. Stenosis affecting the ICA bulb, most severe at the distal bulb level where the stenosis measures 60%. Stenosis also affects the proximal ECA, with luminal diameter of 2 mm. Bilateral vertebral artery origin stenoses estimated at 50-70% on the right and 30-50% on the left. These results will be called to the ordering clinician or representative by the Radiologist Assistant, and communication documented in the PACS or zVision Dashboard. Electronically Signed   By: Paulina Fusi M.D.   On: 01/26/2019 16:24   Vas US Carotid  Result Date: 01/26/2019 Carotid Arterial Duplex Study Risk Factors:                          Hypertension, hyperlipidemia, no history                                        of smoking. Other Factors:                         Current  user of smokeless tobacco. Pre-Surgical Evaluation & Surgical     Stenosis at bifurcation only. ICA is not Correlation:  normal past stenosis. Anatomy is within                                        normal limits. Bifurcation is located                                        near the Hyoid Notch. Right proximal ICA                                        stenosis. Left distal CCA and proximal                                        ICA stenosis. Performing Technologist: Carlos American RVT, RDCS (AE), RDMS  Examination Guidelines: A complete evaluation includes B-mode imaging, spectral Doppler, color Doppler, and power Doppler as needed of all accessible portions of each vessel. Bilateral testing is considered an integral part of a complete examination. Limited examinations for reoccurring indications may be performed as noted.  Right Carotid Findings: +----------+--------+--------+--------+-------------------------+--------+             PSV cm/s EDV cm/s Stenosis Describe                  Comments  +----------+--------+--------+--------+-------------------------+--------+  CCA Prox   99       19                                                    +----------+--------+--------+--------+-------------------------+--------+  CCA Mid    58       11       <50%     homogeneous and smooth              +----------+--------+--------+--------+-------------------------+--------+  CCA Distal 50       14                                                    +----------+--------+--------+--------+-------------------------+--------+  ICA Prox   664      337      80-99%   heterogenous and calcific           +----------+--------+--------+--------+-------------------------+--------+  ICA Mid    441      200                                                   +----------+--------+--------+--------+-------------------------+--------+  ICA Distal 32       17                                                     +----------+--------+--------+--------+-------------------------+--------+  ECA        357      59       >50%     homogeneous and smooth              +----------+--------+--------+--------+-------------------------+--------+ +----------+--------+-------+----------------+-------------------+             PSV cm/s EDV cms Describe         Arm Pressure (mmHG)  +----------+--------+-------+----------------+-------------------+  Subclavian 151              Multiphasic, WNL 160                  +----------+--------+-------+----------------+-------------------+ +---------+--------+--+--------+--+---------+  Vertebral PSV cm/s 53 EDV cm/s 29 Antegrade  +---------+--------+--+--------+--+---------+  Left Carotid Findings: +----------+--------+--------+--------+----------------------+--------+             PSV cm/s EDV cm/s Stenosis Describe               Comments  +----------+--------+--------+--------+----------------------+--------+  CCA Prox   145      35                                                 +----------+--------+--------+--------+----------------------+--------+  CCA Mid    97       32                                                 +----------+--------+--------+--------+----------------------+--------+  CCA Distal 289      66       >50%     homogeneous and smooth           +----------+--------+--------+--------+----------------------+--------+  ICA Prox   376      118      80-99%   homogeneous and smooth           +----------+--------+--------+--------+----------------------+--------+  ICA Mid    120      39                                                 +----------+--------+--------+--------+----------------------+--------+  ICA Distal 80       26                                                 +----------+--------+--------+--------+----------------------+--------+  ECA        339      71       >50%                                      +----------+--------+--------+--------+----------------------+--------+  +----------+--------+--------+----------------+-------------------+  Subclavian PSV cm/s EDV cm/s Describe         Arm Pressure (mmHG)  +----------+--------+--------+----------------+-------------------+             174               Multiphasic, WNL 150                  +----------+--------+--------+----------------+-------------------+ +---------+--------+--+--------+--+---------+  Vertebral PSV cm/s 70 EDV cm/s 19 Antegrade  +---------+--------+--+--------+--+---------+  Summary: Right Carotid: Velocities in the right ICA are consistent with a 80-99%                stenosis. Non-hemodynamically significant plaque <50% noted in                the CCA. The ECA appears >50% stenosed. Left Carotid: Velocities in the left ICA are consistent with a 80-99% stenosis.               Hemodynamically significant plaque >50% visualized in the CCA. The               ECA appears >50% stenosed. Vertebrals:  Bilateral vertebral arteries demonstrate antegrade flow. Subclavians: Normal flow hemodynamics were seen in bilateral subclavian              arteries. *See table(s) above for measurements and observations. Patient is being scheduled for CT and vascular surgery consultation. Vascular consult recommended. Electronically signed by Nanetta BattyJonathan Berry MD on 01/26/2019 at 4:45:58 PM.    Final     Labs Recent Results (from the past 2160 hour(s))  I-STAT creatinine     Status: None   Collection Time: 01/26/19  3:54 PM  Result Value Ref Range   Creatinine, Ser 0.70 0.61 - 1.24 mg/dL    Assessment/Plan:  Hyperlipidemia lipid control important in reducing the progression of atherosclerotic disease. Continue statin therapy   Essential hypertension blood pressure control important in reducing the progression of atherosclerotic disease. On appropriate oral medications.   Carotid stenosis he recently underwent a carotid ultrasound and a carotid CT angiogram which I have independently reviewed.  The duplex have velocities  that would fall in the 80 to 99% range bilaterally.  The CT angiogram shows a string sign in the right carotid artery consistent with a greater than 90% stenosis.  The official report was of a 60% left carotid artery stenosis but I believe this is much closer to a high-grade range of 75 to 80%. I discussed with the patient some length that this is a high risk situation with a high risk of stroke with medical management alone.  He should continue his aspirin and statin agent.  His anatomy is not favorable for carotid stenting and he is not largely symptomatic so I would not recommend carotid artery stenting on either side at this point.  I would recommend a right carotid endarterectomy for his extremely high-grade stenosis on the side.  We would reassess him in 6 to 8 weeks to further evaluate for potential left carotid endarterectomy, although this lesion is much more borderline for needing repair.  The right side should be repaired in the very near future.  I have discussed the risks and benefits of carotid endarterectomy.  I have discussed the reason and rationale for treatment largely being a marked reduced risk of stroke.  He still has a risk of stroke, heart attack, and other complications from surgery but this would be much lower than with medical management.  The patient voices his understanding and is agreeable to proceed with right carotid endarterectomy.      Shawn Sherman 01/30/2019, 11:53 AM   This note was created with Dragon medical transcription system.  Any errors from dictation are unintentional.

## 2019-01-30 NOTE — Patient Instructions (Signed)
Carotid Endarterectomy °A carotid endarterectomy is a surgery to remove a blockage in the carotid arteries. The carotid arteries are the large blood vessels on both sides of the neck that supply blood to the brain. Carotid artery disease, also called carotid artery stenosis, is the narrowing or blockage of one or both carotid arteries. Carotid artery disease is usually caused by atherosclerosis, which is a buildup of fat and plaque in the arteries. Some buildup of plaque normally occurs with aging. The plaque may partially or totally block blood flow or cause a clot to form in the carotid arteries. This may cause a stroke. °Tell a health care provider about: °· Any allergies you have. °· All medicines you are taking, including vitamins, herbs, eye drops, creams, and over-the-counter medicines. °· Any problems you or family members have had with anesthetic medicines. °· Any blood disorders you have. °· Any surgeries you have had. °· Any medical conditions you have, or have had, including diabetes, kidney problems, and infections. °· Whether you are pregnant or may be pregnant. °What are the risks? °Generally, this is a safe procedure. However, problems may occur, including: °· Infection. °· Bleeding. °· Blood clots. °· Allergic reactions to medicines. °· Damage to nerves near the carotid arteries. This can cause a hoarse voice or weakness of muscles in your face. °· Stroke. °· Seizures. °· Heart attack (myocardial infarction). °· Narrowing of the opened blood vessel (restenosis). This may require another surgery. °What happens before the procedure? °Staying hydrated °Follow instructions from your health care provider about hydration, which may include: °· Up to 2 hours before the procedure - you may continue to drink clear liquids, such as water, clear fruit juice, black coffee, and plain tea. ° °Eating and drinking restrictions °Follow instructions from your health care provider about eating and drinking, which may  include: °· 8 hours before the procedure - stop eating heavy meals or foods, such as meat, fried foods, or fatty foods. °· 6 hours before the procedure - stop eating light meals or foods, such as toast or cereal. °· 6 hours before the procedure - stop drinking milk or drinks that contain milk. °· 2 hours before the procedure - stop drinking clear liquids. °Medicines °· Ask your health care provider about: °? Changing or stopping your regular medicines. This is especially important if you are taking diabetes medicines or blood thinners. °? Taking medicines such as aspirin and ibuprofen. These medicines can thin your blood. Do not take these medicines unless your health care provider tells you to take them. °? Taking over-the-counter medicines, vitamins, herbs, and supplements. °General instructions °· Do not use any products that contain nicotine or tobacco for at least 4-6 weeks, or as soon as possible, before the procedure. These products include cigarettes, e-cigarettes, and chewing tobacco. If you need help quitting, ask your health care provider. °· You may need to have blood tests, a test to check heart rhythm (electrocardiogram), or a test to check blood flow (angiogram). °· Plan to have someone take you home from the hospital or clinic. °· Ask your health care provider: °? How your surgery site will be marked. °? What steps will be taken to help prevent infection. These may include: °§ Removing hair at the surgery site. °§ Washing skin with a germ-killing soap. °§ Taking antibiotic medicine. °What happens during the procedure? ° °· An IV will be inserted into one of your veins. °· You will be given one or more of the following: °?   A medicine to help you relax (sedative). °? A medicine to make you fall asleep (general anesthetic). °· The surgeon will make a small incision in your neck to expose the carotid artery. °· A tube may be inserted into the carotid artery above and below the blockage. This tube will  allow blood to flow around the blockage during the surgery. °· An incision will be made in the carotid artery at the location of the blockage. °· The blockage will be removed. In some cases, a section of the carotid artery may be removed and a graft patch may be used to repair the artery. °· The carotid artery will be closed with stitches (sutures). °· If a tube was inserted into the artery to allow blood flow around the blockage during surgery, the tube will be removed. Once the tube is removed, blood flow through the carotid artery will be restored. °· The incision in the neck will be closed with sutures. °· A bandage (dressing) will be placed over your incision. °The procedure may vary among health care providers and hospitals. °What happens after the procedure? °· Your blood pressure, heart rate, breathing rate, and blood oxygen level will be monitored until you leave the hospital or clinic. °· You may have some pain or an ache in your neck for about 2 weeks. This is normal. °· Do not drive for 24 hours if you were given a sedative during your procedure. °Summary °· A carotid endarterectomy is a surgery to remove a blockage in the carotid arteries. °· The carotid arteries are the large blood vessels on both sides of the neck that supply blood to the brain. °· Before the procedure, ask your health care provider about changing or stopping your regular medicines. °· Follow instructions from your health care provider about eating and drinking before the procedure. °· After the procedure, do not drive for 24 hours if you were given a sedative. °This information is not intended to replace advice given to you by your health care provider. Make sure you discuss any questions you have with your health care provider. °Document Released: 02/25/2013 Document Revised: 03/04/2018 Document Reviewed: 03/04/2018 °Elsevier Patient Education © 2020 [employer] ° °

## 2019-01-30 NOTE — Assessment & Plan Note (Signed)
lipid control important in reducing the progression of atherosclerotic disease. Continue statin therapy  

## 2019-01-30 NOTE — Assessment & Plan Note (Signed)
he recently underwent a carotid ultrasound and a carotid CT angiogram which I have independently reviewed.  The duplex have velocities that would fall in the 80 to 99% range bilaterally.  The CT angiogram shows a string sign in the right carotid artery consistent with a greater than 90% stenosis.  The official report was of a 60% left carotid artery stenosis but I believe this is much closer to a high-grade range of 75 to 80%. I discussed with the patient some length that this is a high risk situation with a high risk of stroke with medical management alone.  He should continue his aspirin and statin agent.  His anatomy is not favorable for carotid stenting and he is not largely symptomatic so I would not recommend carotid artery stenting on either side at this point.  I would recommend a right carotid endarterectomy for his extremely high-grade stenosis on the side.  We would reassess him in 6 to 8 weeks to further evaluate for potential left carotid endarterectomy, although this lesion is much more borderline for needing repair.  The right side should be repaired in the very near future.  I have discussed the risks and benefits of carotid endarterectomy.  I have discussed the reason and rationale for treatment largely being a marked reduced risk of stroke.  He still has a risk of stroke, heart attack, and other complications from surgery but this would be much lower than with medical management.  The patient voices his understanding and is agreeable to proceed with right carotid endarterectomy.

## 2019-01-30 NOTE — Assessment & Plan Note (Signed)
blood pressure control important in reducing the progression of atherosclerotic disease. On appropriate oral medications.  

## 2019-01-30 NOTE — Telephone Encounter (Signed)
Returned call to patient this am. He has appt in [place] with Dr. Lucky Cowboy at 10:45 Am. He reports that he is actually on the way there now.   Advised pt to call for any further questions or concerns.

## 2019-02-02 ENCOUNTER — Telehealth (INDEPENDENT_AMBULATORY_CARE_PROVIDER_SITE_OTHER): Payer: Self-pay

## 2019-02-02 NOTE — Telephone Encounter (Signed)
Spoke with the patient and he is now scheduled for surgery with Dr. Lucky Cowboy on 02/18/2019 and will do his Covid testing/ pre-op on 02/13/2019 at 11:00 am. Surgical instructions will be mailed out to the patient.

## 2019-02-12 ENCOUNTER — Other Ambulatory Visit (INDEPENDENT_AMBULATORY_CARE_PROVIDER_SITE_OTHER): Payer: Self-pay | Admitting: Nurse Practitioner

## 2019-02-13 ENCOUNTER — Encounter
Admission: RE | Admit: 2019-02-13 | Discharge: 2019-02-13 | Disposition: A | Discharge status: Home / Self Care | Payer: 59 | Source: Ambulatory Visit | Attending: Vascular Surgery | Admitting: Vascular Surgery

## 2019-02-13 ENCOUNTER — Other Ambulatory Visit: Payer: Self-pay

## 2019-02-13 DIAGNOSIS — I6521 Occlusion and stenosis of right carotid artery: Secondary | ICD-10-CM | POA: Diagnosis not present

## 2019-02-13 DIAGNOSIS — Z01818 Encounter for other preprocedural examination: Secondary | ICD-10-CM | POA: Diagnosis present

## 2019-02-13 DIAGNOSIS — Z20828 Contact with and (suspected) exposure to other viral communicable diseases: Secondary | ICD-10-CM | POA: Diagnosis not present

## 2019-02-13 LAB — CBC WITH DIFFERENTIAL/PLATELET
Abs Immature Granulocytes: 0.02 10*3/uL (ref 0.00–0.07)
Basophils Absolute: 0.1 10*3/uL (ref 0.0–0.1)
Basophils Relative: 1 %
Eosinophils Absolute: 0.2 10*3/uL (ref 0.0–0.5)
Eosinophils Relative: 3 %
HCT: 44.7 % (ref 39.0–52.0)
Hemoglobin: 14.9 g/dL (ref 13.0–17.0)
Immature Granulocytes: 0 %
Lymphocytes Relative: 16 %
Lymphs Abs: 1 10*3/uL (ref 0.7–4.0)
MCH: 28.2 pg (ref 26.0–34.0)
MCHC: 33.3 g/dL (ref 30.0–36.0)
MCV: 84.7 fL (ref 80.0–100.0)
Monocytes Absolute: 0.5 10*3/uL (ref 0.1–1.0)
Monocytes Relative: 7 %
Neutro Abs: 4.6 10*3/uL (ref 1.7–7.7)
Neutrophils Relative %: 73 %
Platelets: 178 10*3/uL (ref 150–400)
RBC: 5.28 MIL/uL (ref 4.22–5.81)
RDW: 13.5 % (ref 11.5–15.5)
WBC: 6.3 10*3/uL (ref 4.0–10.5)
nRBC: 0 % (ref 0.0–0.2)

## 2019-02-13 LAB — TYPE AND SCREEN
ABO/RH(D): A NEG
Antibody Screen: NEGATIVE

## 2019-02-13 LAB — BASIC METABOLIC PANEL
Anion gap: 7 (ref 5–15)
BUN: 14 mg/dL (ref 8–23)
CO2: 27 mmol/L (ref 22–32)
Calcium: 9 mg/dL (ref 8.9–10.3)
Chloride: 104 mmol/L (ref 98–111)
Creatinine, Ser: 0.81 mg/dL (ref 0.61–1.24)
GFR calc Af Amer: >60 mL/min (ref >60)
GFR calc non Af Amer: >60 mL/min (ref >60)
Glucose, Bld: 142 mg/dL — ABNORMAL HIGH (ref 70–99)
Potassium: 3.9 mmol/L (ref 3.5–5.1)
Sodium: 138 mmol/L (ref 135–145)

## 2019-02-13 LAB — APTT: aPTT: 30 seconds (ref 24–36)

## 2019-02-13 LAB — PROTIME-INR
INR: 1 (ref 0.8–1.2)
Prothrombin Time: 13.1 seconds (ref 11.4–15.2)

## 2019-02-13 NOTE — Patient Instructions (Signed)
Your procedure is scheduled on: Wednesday 02/18/19 Report to [place]. To find out your arrival time please call [phone] between 1PM - 3PM on Tuesday 02/17/19.  Remember: Instructions that are not followed completely may result in serious medical risk, up to and including death, or upon the discretion of your surgeon and anesthesiologist your surgery may need to be rescheduled.     _X__ 1. Do not eat food after midnight the night before your procedure.                 No gum chewing or hard candies. You may drink clear liquids up to 2 hours                 before you are scheduled to arrive for your surgery- DO not drink clear                 liquids within 2 hours of the start of your surgery.                 Clear Liquids include:  water, apple juice without pulp, clear carbohydrate                 drink such as Clearfast or Gatorade, Black Coffee or Tea (Do not add                 anything to coffee or tea). Diabetics water only  __X__2.  On the morning of surgery brush your teeth with toothpaste and water, you                 may rinse your mouth with mouthwash if you wish.  Do not swallow any              toothpaste of mouthwash.     _X__ 3.  No Alcohol for 24 hours before or after surgery.   _X__ 4.  Do Not Smoke or use e-cigarettes For 24 Hours Prior to Your Surgery.                 Do not use any chewable tobacco products for at least 6 hours prior to                 surgery.  ____  5.  Bring all medications with you on the day of surgery if instructed.   __X__  6.  Notify your doctor if there is any change in your medical condition      (cold, fever, infections).     Do not wear jewelry, make-up, hairpins, clips or nail polish. Do not wear lotions, powders, or perfumes.  Do not shave 48 hours prior to surgery. Men may shave face and neck. Do not bring valuables to the hospital.    [facility] is not responsible for  any belongings or valuables.  Contacts, dentures/partials or body piercings may not be worn into surgery. Bring a case for your contacts, glasses or hearing aids, a denture cup will be supplied. Leave your suitcase in the car. After surgery it may be brought to your room. For patients admitted to the hospital, discharge time is determined by your treatment team.   Patients discharged the day of surgery will not be allowed to drive home.   Please read over the following fact sheets that you were given:   MRSA Information  __X__ Take these medicines the morning of surgery with A SIP OF WATER:  1. amLODipine (NORVASC  2. esomeprazole ([place]) take even though you took it the night before  3.   4.  5.  6.  ____ Fleet Enema (as directed)   __X__ Use CHG Soap/SAGE wipes as directed  ____ Use inhalers on the day of surgery  ____ Stop metformin/Janumet/Farxiga 2 days prior to surgery    ____ Take 1/2 of usual insulin dose the night before surgery. No insulin the morning          of surgery.   ____ Stop Blood Thinners Coumadin/Plavix/Xarelto/Pleta/Pradaxa/Eliquis/Effient/Aspirin  on   Or contact your Surgeon, Cardiologist or Medical Doctor regarding  ability to stop your blood thinners  __X__ Stop Anti-inflammatories 7 days before surgery such as Advil, Ibuprofen, Motrin,  BC or Goodies Powder, Naprosyn, Naproxen, Aleve, Aspirin    __X__ Stop all herbal supplements, fish oil or vitamin E until after surgery.    ____ Bring C-Pap to the hospital.

## 2019-02-14 LAB — SARS CORONAVIRUS 2 (TAT 6-24 HRS): SARS Coronavirus 2: NEGATIVE

## 2019-02-16 ENCOUNTER — Other Ambulatory Visit (INDEPENDENT_AMBULATORY_CARE_PROVIDER_SITE_OTHER): Payer: Self-pay | Admitting: Nurse Practitioner

## 2019-02-17 ENCOUNTER — Other Ambulatory Visit (INDEPENDENT_AMBULATORY_CARE_PROVIDER_SITE_OTHER): Payer: Self-pay | Admitting: Nurse Practitioner

## 2019-02-17 DIAGNOSIS — Z0289 Encounter for other administrative examinations: Secondary | ICD-10-CM

## 2019-02-17 MED ORDER — CEFAZOLIN SODIUM-DEXTROSE 2-4 GM/100ML-% IV SOLN
2.0000 g | INTRAVENOUS | Status: AC
  Administered 2019-02-18: 08:00:00 2 g via INTRAVENOUS

## 2019-02-18 ENCOUNTER — Encounter: Payer: Self-pay | Admitting: Emergency Medicine

## 2019-02-18 ENCOUNTER — Telehealth (INDEPENDENT_AMBULATORY_CARE_PROVIDER_SITE_OTHER): Payer: Self-pay

## 2019-02-18 ENCOUNTER — Inpatient Hospital Stay: Payer: 59 | Admitting: Anesthesiology

## 2019-02-18 ENCOUNTER — Inpatient Hospital Stay
Admission: RE | Admit: 2019-02-18 | Discharge: 2019-02-19 | DRG: 039 | Disposition: A | Discharge status: Home / Self Care | Payer: 59 | Attending: Vascular Surgery | Admitting: Vascular Surgery

## 2019-02-18 ENCOUNTER — Encounter
Admission: RE | Disposition: A | Discharge status: Home / Self Care | Payer: Self-pay | Source: Home / Self Care | Attending: Vascular Surgery

## 2019-02-18 ENCOUNTER — Other Ambulatory Visit: Payer: Self-pay

## 2019-02-18 DIAGNOSIS — E785 Hyperlipidemia, unspecified: Secondary | ICD-10-CM | POA: Diagnosis present

## 2019-02-18 DIAGNOSIS — K219 Gastro-esophageal reflux disease without esophagitis: Secondary | ICD-10-CM | POA: Diagnosis present

## 2019-02-18 DIAGNOSIS — F17220 Nicotine dependence, chewing tobacco, uncomplicated: Secondary | ICD-10-CM | POA: Diagnosis present

## 2019-02-18 DIAGNOSIS — I10 Essential (primary) hypertension: Secondary | ICD-10-CM | POA: Diagnosis present

## 2019-02-18 DIAGNOSIS — I6523 Occlusion and stenosis of bilateral carotid arteries: Principal | ICD-10-CM | POA: Diagnosis present

## 2019-02-18 DIAGNOSIS — Z7982 Long term (current) use of aspirin: Secondary | ICD-10-CM | POA: Diagnosis not present

## 2019-02-18 DIAGNOSIS — Z79899 Other long term (current) drug therapy: Secondary | ICD-10-CM | POA: Diagnosis not present

## 2019-02-18 DIAGNOSIS — I6521 Occlusion and stenosis of right carotid artery: Secondary | ICD-10-CM | POA: Diagnosis present

## 2019-02-18 DIAGNOSIS — Z825 Family history of asthma and other chronic lower respiratory diseases: Secondary | ICD-10-CM

## 2019-02-18 HISTORY — PX: ENDARTERECTOMY: SHX5162

## 2019-02-18 LAB — MRSA PCR SCREENING: MRSA by PCR: NEGATIVE

## 2019-02-18 LAB — GLUCOSE, CAPILLARY: Glucose-Capillary: 155 mg/dL — ABNORMAL HIGH (ref 70–99)

## 2019-02-18 LAB — ABO/RH: ABO/RH(D): A NEG

## 2019-02-18 SURGERY — ENDARTERECTOMY, CAROTID
Anesthesia: General | Laterality: Right

## 2019-02-18 MED ORDER — MAGNESIUM SULFATE 2 GM/50ML IV SOLN: 2.0000 g | Freq: Every day | INTRAVENOUS | Status: DC | PRN

## 2019-02-18 MED ORDER — NITROGLYCERIN IN D5W 200-5 MCG/ML-% IV SOLN: 5.0000 ug/min | INTRAVENOUS | Status: DC

## 2019-02-18 MED ORDER — HEPARIN SODIUM (PORCINE) 1000 UNIT/ML IJ SOLN
INTRAMUSCULAR | Status: AC
  Filled 2019-02-18: qty 1

## 2019-02-18 MED ORDER — POTASSIUM CHLORIDE CRYS ER 20 MEQ PO TBCR: 20.0000 meq | EXTENDED_RELEASE_TABLET | Freq: Every day | ORAL | Status: DC | PRN

## 2019-02-18 MED ORDER — ALUM & MAG HYDROXIDE-SIMETH 200-200-20 MG/5ML PO SUSP: 15.0000 mL | ORAL | Status: DC | PRN

## 2019-02-18 MED ORDER — MORPHINE SULFATE (PF) 2 MG/ML IV SOLN
2.0000 mg | INTRAVENOUS | Status: DC | PRN
  Administered 2019-02-18: 2 mg via INTRAVENOUS
  Filled 2019-02-18: qty 1

## 2019-02-18 MED ORDER — SODIUM CHLORIDE 0.9 % IV SOLN
INTRAVENOUS | Status: DC | PRN
  Administered 2019-02-18: 25 ug/min via INTRAVENOUS

## 2019-02-18 MED ORDER — SODIUM CHLORIDE 0.9 % IV SOLN: 500.0000 mL | Freq: Once | INTRAVENOUS | Status: DC | PRN

## 2019-02-18 MED ORDER — FENTANYL CITRATE (PF) 100 MCG/2ML IJ SOLN
25.0000 ug | INTRAMUSCULAR | Status: DC | PRN
  Administered 2019-02-18 (×3): 25 ug via INTRAVENOUS

## 2019-02-18 MED ORDER — CLOPIDOGREL BISULFATE 75 MG PO TABS
75.0000 mg | ORAL_TABLET | Freq: Every day | ORAL | Status: DC
  Administered 2019-02-19: 75 mg via ORAL
  Filled 2019-02-18: qty 1

## 2019-02-18 MED ORDER — FENTANYL CITRATE (PF) 100 MCG/2ML IJ SOLN
INTRAMUSCULAR | Status: DC | PRN
  Administered 2019-02-18 (×2): 50 ug via INTRAVENOUS

## 2019-02-18 MED ORDER — ONDANSETRON HCL 4 MG/2ML IJ SOLN: 4.0000 mg | Freq: Four times a day (QID) | INTRAMUSCULAR | Status: DC | PRN

## 2019-02-18 MED ORDER — GUAIFENESIN-DM 100-10 MG/5ML PO SYRP: 15.0000 mL | ORAL_SOLUTION | ORAL | Status: DC | PRN

## 2019-02-18 MED ORDER — CHLORHEXIDINE GLUCONATE CLOTH 2 % EX PADS: 6.0000 | MEDICATED_PAD | Freq: Once | CUTANEOUS | Status: DC

## 2019-02-18 MED ORDER — ACETAMINOPHEN 650 MG RE SUPP: 325.0000 mg | RECTAL | Status: DC | PRN

## 2019-02-18 MED ORDER — METOPROLOL TARTRATE 5 MG/5ML IV SOLN: 2.0000 mg | INTRAVENOUS | Status: DC | PRN

## 2019-02-18 MED ORDER — DOCUSATE SODIUM 100 MG PO CAPS
100.0000 mg | ORAL_CAPSULE | Freq: Every day | ORAL | Status: DC
  Administered 2019-02-19: 100 mg via ORAL
  Filled 2019-02-18: qty 1

## 2019-02-18 MED ORDER — LIDOCAINE HCL (PF) 1 % IJ SOLN
INTRAMUSCULAR | Status: AC
  Filled 2019-02-18: qty 30

## 2019-02-18 MED ORDER — PHENOL 1.4 % MT LIQD
1.0000 | OROMUCOSAL | Status: DC | PRN
  Filled 2019-02-18: qty 177

## 2019-02-18 MED ORDER — OXYCODONE-ACETAMINOPHEN 5-325 MG PO TABS
1.0000 | ORAL_TABLET | ORAL | Status: DC | PRN
  Administered 2019-02-18: 2 via ORAL
  Filled 2019-02-18: qty 2

## 2019-02-18 MED ORDER — PANTOPRAZOLE SODIUM 40 MG PO TBEC
40.0000 mg | DELAYED_RELEASE_TABLET | Freq: Every day | ORAL | Status: DC
  Administered 2019-02-19: 40 mg via ORAL
  Filled 2019-02-18: qty 1

## 2019-02-18 MED ORDER — HYDRALAZINE HCL 20 MG/ML IJ SOLN: 5.0000 mg | INTRAMUSCULAR | Status: DC | PRN

## 2019-02-18 MED ORDER — ACETAMINOPHEN 325 MG PO TABS: 325.0000 mg | ORAL_TABLET | ORAL | Status: DC | PRN

## 2019-02-18 MED ORDER — SODIUM CHLORIDE 0.9 % IV SOLN
INTRAVENOUS | Status: DC | PRN
  Administered 2019-02-18: 09:00:00 75 mL via INTRAMUSCULAR

## 2019-02-18 MED ORDER — SUCCINYLCHOLINE CHLORIDE 20 MG/ML IJ SOLN
INTRAMUSCULAR | Status: DC | PRN
  Administered 2019-02-18: 100 mg via INTRAVENOUS

## 2019-02-18 MED ORDER — PROPOFOL 10 MG/ML IV BOLUS
INTRAVENOUS | Status: DC | PRN
  Administered 2019-02-18: 120 mg via INTRAVENOUS

## 2019-02-18 MED ORDER — FENTANYL CITRATE (PF) 100 MCG/2ML IJ SOLN
INTRAMUSCULAR | Status: AC
  Filled 2019-02-18: qty 2

## 2019-02-18 MED ORDER — PROPOFOL 10 MG/ML IV BOLUS
INTRAVENOUS | Status: AC
  Filled 2019-02-18: qty 20

## 2019-02-18 MED ORDER — LIDOCAINE HCL 1 % IJ SOLN
INTRAMUSCULAR | Status: DC | PRN
  Administered 2019-02-18: 10 mL

## 2019-02-18 MED ORDER — EVICEL 2 ML EX KIT
PACK | CUTANEOUS | Status: DC | PRN
  Administered 2019-02-18: 2 mL

## 2019-02-18 MED ORDER — LACTATED RINGERS IV SOLN
INTRAVENOUS | Status: DC
  Administered 2019-02-18 (×2): via INTRAVENOUS

## 2019-02-18 MED ORDER — ONDANSETRON HCL 4 MG/2ML IJ SOLN
INTRAMUSCULAR | Status: DC | PRN
  Administered 2019-02-18: 4 mg via INTRAVENOUS

## 2019-02-18 MED ORDER — FAMOTIDINE IN NACL 20-0.9 MG/50ML-% IV SOLN
20.0000 mg | Freq: Two times a day (BID) | INTRAVENOUS | Status: DC
  Administered 2019-02-18: 20 mg via INTRAVENOUS
  Filled 2019-02-18: qty 50

## 2019-02-18 MED ORDER — CEFAZOLIN SODIUM-DEXTROSE 2-4 GM/100ML-% IV SOLN
2.0000 g | Freq: Three times a day (TID) | INTRAVENOUS | Status: AC
  Administered 2019-02-18 – 2019-02-19 (×2): 2 g via INTRAVENOUS
  Filled 2019-02-18 (×2): qty 100

## 2019-02-18 MED ORDER — FAMOTIDINE 20 MG PO TABS
20.0000 mg | ORAL_TABLET | Freq: Two times a day (BID) | ORAL | Status: DC
  Administered 2019-02-18: 22:00:00 20 mg via ORAL
  Filled 2019-02-18 (×3): qty 1

## 2019-02-18 MED ORDER — ONDANSETRON HCL 4 MG/2ML IJ SOLN: 4.0000 mg | Freq: Once | INTRAMUSCULAR | Status: DC | PRN

## 2019-02-18 MED ORDER — MIDAZOLAM HCL 2 MG/2ML IJ SOLN
INTRAMUSCULAR | Status: AC
  Filled 2019-02-18: qty 2

## 2019-02-18 MED ORDER — ASPIRIN 81 MG PO CHEW
81.0000 mg | CHEWABLE_TABLET | Freq: Every day | ORAL | Status: DC
  Administered 2019-02-19: 81 mg via ORAL
  Filled 2019-02-18: qty 1

## 2019-02-18 MED ORDER — DEXAMETHASONE SODIUM PHOSPHATE 10 MG/ML IJ SOLN
INTRAMUSCULAR | Status: DC | PRN
  Administered 2019-02-18: 10 mg via INTRAVENOUS

## 2019-02-18 MED ORDER — LABETALOL HCL 5 MG/ML IV SOLN: 10.0000 mg | INTRAVENOUS | Status: DC | PRN

## 2019-02-18 MED ORDER — EVICEL 2 ML EX KIT
PACK | CUTANEOUS | Status: AC
  Filled 2019-02-18: qty 1

## 2019-02-18 MED ORDER — SODIUM CHLORIDE 0.9 % IV SOLN
INTRAVENOUS | Status: DC
  Administered 2019-02-18: 12:00:00 via INTRAVENOUS

## 2019-02-18 MED ORDER — HEMOSTATIC AGENTS (NO CHARGE) OPTIME
TOPICAL | Status: DC | PRN
  Administered 2019-02-18: 1

## 2019-02-18 MED ORDER — ROCURONIUM BROMIDE 100 MG/10ML IV SOLN
INTRAVENOUS | Status: DC | PRN
  Administered 2019-02-18: 40 mg via INTRAVENOUS
  Administered 2019-02-18: 50 mg via INTRAVENOUS
  Administered 2019-02-18: 25 mg via INTRAVENOUS
  Administered 2019-02-18: 10 mg via INTRAVENOUS

## 2019-02-18 MED ORDER — ESMOLOL HCL-SODIUM CHLORIDE 2000 MG/100ML IV SOLN
25.0000 ug/kg/min | INTRAVENOUS | Status: DC
  Filled 2019-02-18: qty 100

## 2019-02-18 MED ORDER — MIDAZOLAM HCL 2 MG/2ML IJ SOLN
INTRAMUSCULAR | Status: DC | PRN
  Administered 2019-02-18: 2 mg via INTRAVENOUS

## 2019-02-18 MED ORDER — HEPARIN SODIUM (PORCINE) 1000 UNIT/ML IJ SOLN
INTRAMUSCULAR | Status: DC | PRN
  Administered 2019-02-18: 6000 [IU] via INTRAVENOUS

## 2019-02-18 MED ORDER — AMLODIPINE BESYLATE 10 MG PO TABS: 10.0000 mg | ORAL_TABLET | Freq: Every day | ORAL | Status: DC

## 2019-02-18 MED ORDER — CEFAZOLIN SODIUM-DEXTROSE 2-4 GM/100ML-% IV SOLN
INTRAVENOUS | Status: AC
  Filled 2019-02-18: qty 100

## 2019-02-18 MED ORDER — LIDOCAINE HCL (CARDIAC) PF 100 MG/5ML IV SOSY
PREFILLED_SYRINGE | INTRAVENOUS | Status: DC | PRN
  Administered 2019-02-18: 100 mg via INTRAVENOUS

## 2019-02-18 MED ORDER — ATORVASTATIN CALCIUM 20 MG PO TABS
80.0000 mg | ORAL_TABLET | Freq: Every evening | ORAL | Status: DC
  Administered 2019-02-18: 80 mg via ORAL
  Filled 2019-02-18: qty 4

## 2019-02-18 SURGICAL SUPPLY — 59 items
ADH SKN CLS APL DERMABOND .7 (GAUZE/BANDAGES/DRESSINGS) ×1
BAG DECANTER FOR FLEXI CONT (MISCELLANEOUS) ×2 IMPLANT
BLADE SURG 15 STRL LF DISP TIS (BLADE) ×1 IMPLANT
BLADE SURG 15 STRL SS (BLADE) ×1
BLADE SURG SZ11 CARB STEEL (BLADE) ×2 IMPLANT
BOOT SUTURE AID YELLOW STND (SUTURE) ×2 IMPLANT
BRUSH SCRUB EZ  4% CHG (MISCELLANEOUS) ×4
BRUSH SCRUB EZ 4% CHG (MISCELLANEOUS) ×4 IMPLANT
CANISTER SUCT 1200ML W/VALVE (MISCELLANEOUS) ×2 IMPLANT
CHLORAPREP W/TINT 26 (MISCELLANEOUS) ×2 IMPLANT
COVER WAND RF STERILE (DRAPES) ×2 IMPLANT
DERMABOND ADVANCED (GAUZE/BANDAGES/DRESSINGS) ×1
DERMABOND ADVANCED .7 DNX12 (GAUZE/BANDAGES/DRESSINGS) ×1 IMPLANT
DRAPE 3/4 80X56 (DRAPES) ×2 IMPLANT
DRAPE INCISE IOBAN 66X45 STRL (DRAPES) ×2 IMPLANT
DRAPE LAPAROTOMY 77X122 PED (DRAPES) ×2 IMPLANT
ELECT CAUTERY BLADE 6.4 (BLADE) ×2 IMPLANT
ELECT REM PT RETURN 9FT ADLT (ELECTROSURGICAL) ×2
ELECTRODE REM PT RTRN 9FT ADLT (ELECTROSURGICAL) ×1 IMPLANT
GLOVE BIO SURGEON STRL SZ7 (GLOVE) ×6 IMPLANT
GLOVE INDICATOR 7.5 STRL GRN (GLOVE) ×6 IMPLANT
GOWN STRL REUS W/ TWL LRG LVL3 (GOWN DISPOSABLE) ×3 IMPLANT
GOWN STRL REUS W/ TWL XL LVL3 (GOWN DISPOSABLE) ×1 IMPLANT
GOWN STRL REUS W/TWL LRG LVL3 (GOWN DISPOSABLE) ×3
GOWN STRL REUS W/TWL XL LVL3 (GOWN DISPOSABLE) ×1
HEMOSTAT SURGICEL 2X3 (HEMOSTASIS) ×2 IMPLANT
IV NS 250ML (IV SOLUTION) ×2
IV NS 250ML BAXH (IV SOLUTION) ×1 IMPLANT
KIT TURNOVER KIT A (KITS) ×2 IMPLANT
LABEL OR SOLS (LABEL) ×2 IMPLANT
LOOP RED MAXI  1X406MM (MISCELLANEOUS) ×2
LOOP VESSEL MAXI 1X406 RED (MISCELLANEOUS) ×2 IMPLANT
LOOP VESSEL MINI 0.8X406 BLUE (MISCELLANEOUS) ×1 IMPLANT
LOOPS BLUE MINI 0.8X406MM (MISCELLANEOUS) ×1
NEEDLE FILTER BLUNT 18X 1/2SAF (NEEDLE) ×1
NEEDLE FILTER BLUNT 18X1 1/2 (NEEDLE) ×1 IMPLANT
NEEDLE HYPO 25X1 1.5 SAFETY (NEEDLE) ×2 IMPLANT
NS IRRIG 500ML POUR BTL (IV SOLUTION) ×2 IMPLANT
PACK BASIN MAJOR ARMC (MISCELLANEOUS) ×2 IMPLANT
PATCH CAROTID ECM VASC 1X10 (Prosthesis & Implant Heart) ×2 IMPLANT
PENCIL ELECTRO HAND CTR (MISCELLANEOUS) ×2 IMPLANT
SHUNT W TPORT 9FR PRUITT F3 (SHUNT) ×2 IMPLANT
SUT MNCRL 4-0 (SUTURE) ×1
SUT MNCRL 4-0 27XMFL (SUTURE) ×1
SUT PROLENE 6 0 BV (SUTURE) ×12 IMPLANT
SUT PROLENE 7 0 BV 1 (SUTURE) ×4 IMPLANT
SUT SILK 2 0 (SUTURE) ×1
SUT SILK 2-0 18XBRD TIE 12 (SUTURE) ×1 IMPLANT
SUT SILK 3 0 (SUTURE) ×1
SUT SILK 3-0 18XBRD TIE 12 (SUTURE) ×1 IMPLANT
SUT SILK 4 0 (SUTURE) ×1
SUT SILK 4-0 18XBRD TIE 12 (SUTURE) ×1 IMPLANT
SUT VIC AB 3-0 SH 27 (SUTURE) ×4
SUT VIC AB 3-0 SH 27X BRD (SUTURE) ×2 IMPLANT
SUTURE MNCRL 4-0 27XMF (SUTURE) ×1 IMPLANT
SYR 10ML LL (SYRINGE) ×4 IMPLANT
SYR 20ML LL LF (SYRINGE) ×2 IMPLANT
TRAY FOLEY MTR SLVR 16FR STAT (SET/KITS/TRAYS/PACK) ×2 IMPLANT
TUBING CONNECTING 10 (TUBING) IMPLANT

## 2019-02-18 NOTE — Transfer of Care (Signed)
Immediate Anesthesia Transfer of Care Note  Patient: KAEGAN HETTICH  Procedure(s) Performed: ENDARTERECTOMY CAROTID (Right )  Patient Location: PACU  Anesthesia Type:General  Level of Consciousness: awake, alert  and oriented  Airway & Oxygen Therapy: Patient Spontanous Breathing and Patient connected to face mask oxygen  Post-op Assessment: Report given to RN and Post -op Vital signs reviewed and stable  Post vital signs: Reviewed and stable  Last Vitals:  Vitals Value Taken Time  BP 129/73 02/18/19 0955  Temp    Pulse 73 02/18/19 0956  Resp    SpO2 100 % 02/18/19 0956  Vitals shown include unvalidated device data.  Last Pain:  Vitals:   02/18/19 0626  TempSrc: Oral  PainSc: 5       Patients Stated Pain Goal: 2 (16/55/37 4827)  Complications: No apparent anesthesia complications

## 2019-02-18 NOTE — Anesthesia Post-op Follow-up Note (Signed)
Anesthesia QCDR form completed.        

## 2019-02-18 NOTE — Anesthesia Procedure Notes (Signed)
Procedure Name: Intubation Date/Time: 02/18/2019 7:46 AM Performed by: Nelda Marseille, CRNA Pre-anesthesia Checklist: Patient identified, Patient being monitored, Timeout performed, Emergency Drugs available and Suction available Patient Re-evaluated:Patient Re-evaluated prior to induction Oxygen Delivery Method: Circle system utilized Preoxygenation: Pre-oxygenation with 100% oxygen Induction Type: IV induction Ventilation: Mask ventilation without difficulty Laryngoscope Size: Mac, 3 and McGraph Grade View: Grade II Tube type: Oral Tube size: 7.5 mm Number of attempts: 1 Airway Equipment and Method: Stylet Placement Confirmation: ETT inserted through vocal cords under direct vision,  positive ETCO2 and breath sounds checked- equal and bilateral Secured at: 21 cm Tube secured with: Tape Dental Injury: Teeth and Oropharynx as per pre-operative assessment

## 2019-02-18 NOTE — Telephone Encounter (Signed)
I spoke with a representative from Disability and they needed to know when was the patient last visit in the office,when his next visit will is schedule for in the office, and was the patient advise to stay out of work. The previous date was given and I informed her at this time the patient does not have appointment schedule due to him having procedure today in the hospital and the appt will be schedule after the procedure.I spoke [employer] (PA) and she recommended the protocol for this procedure is to be out off work for at least 2weeks.Also she needed to know was this a inpatient or out patient procedure and I informed her this is a inpatient procedure.

## 2019-02-18 NOTE — Anesthesia Preprocedure Evaluation (Signed)
Anesthesia Evaluation  Patient identified by MRN, date of birth, ID band Patient awake    Reviewed: Allergy & Precautions, NPO status , Patient's Chart, lab work & pertinent test results  History of Anesthesia Complications Negative for: history of anesthetic complications  Airway Mallampati: II       Dental   Pulmonary neg sleep apnea, neg COPD, Not current smoker,           Cardiovascular hypertension, Pt. on medications (-) Past MI and (-) CHF (-) Valvular Problems/Murmurs     Neuro/Psych neg Seizures    GI/Hepatic Neg liver ROS, GERD  Medicated and Controlled,  Endo/Other  neg diabetes  Renal/GU Renal disease (stones)     Musculoskeletal   Abdominal   Peds  Hematology   Anesthesia Other Findings   Reproductive/Obstetrics                             Anesthesia Physical Anesthesia Plan  ASA: III  Anesthesia Plan: General   Post-op Pain Management:    Induction: Intravenous  PONV Risk Score and Plan: 2 and Dexamethasone and Ondansetron  Airway Management Planned: Oral ETT  Additional Equipment:   Intra-op Plan:   Post-operative Plan:   Informed Consent: I have reviewed the patients History and Physical, chart, labs and discussed the procedure including the risks, benefits and alternatives for the proposed anesthesia with the patient or authorized representative who has indicated his/her understanding and acceptance.       Plan Discussed with:   Anesthesia Plan Comments:         Anesthesia Quick Evaluation

## 2019-02-18 NOTE — Op Note (Signed)
[facility]   OPERATIVE NOTE  PROCEDURE:   1.  Right carotid endarterectomy with CorMatrix arterial patch reconstruction  PRE-OPERATIVE DIAGNOSIS: 1.  High grade, near occlusive carotid stenosis 2. Moderate to high grade left carotid stenosis 3. Hypertension   POST-OPERATIVE DIAGNOSIS: same as above   SURGEON: Leotis Pain, MD  ASSISTANT(S): none  ANESTHESIA: general  ESTIMATED BLOOD LOSS: 50 cc  FINDING(S): 1.  right carotid plaque.  SPECIMEN(S):  Carotid plaque (sent to Pathology)  INDICATIONS:   Shawn Sherman is a 63 y.o. male who presents with right carotid stenosis of >90%.  I discussed with the patient the risks, benefits, and alternatives to carotid endarterectomy.  I discussed the differences between carotid stenting and carotid endarterectomy. I discussed the procedural details of carotid endarterectomy with the patient.  The patient is aware that the risks of carotid endarterectomy include but are not limited to: bleeding, infection, stroke, myocardial infarction, death, cranial nerve injuries both temporary and permanent, neck hematoma, possible airway compromise, labile blood pressure post-operatively, cerebral hyperperfusion syndrome, and possible need for additional interventions in the future. The patient is aware of the risks and agrees to proceed forward with the procedure.  DESCRIPTION: After full informed written consent was obtained from the patient, the patient was brought back to the operating room and placed supine upon the operating table.  Prior to induction, the patient received IV antibiotics.  After obtaining adequate anesthesia, the patient was placed into a modified beach chair position with a shoulder roll in place and the patient's neck slightly hyperextended and rotated away from the surgical site.  The patient was prepped in the standard fashion for a carotid endarterectomy.  I made an incision anterior to the sternocleidomastoid  muscle and dissected down through the subcutaneous tissue.  The platysmas was opened with electrocautery.  Then I dissected down to the internal jugular vein and facial vein.  The facial vein is ligated and divided between 2-0 silk ties.  This was dissected posteriorly until I obtained visualization of the common carotid artery.  This was dissected out and then a vessel loop was placed around the common carotid artery.  I then dissected in a periadventitial fashion along the common carotid artery up to the bifurcation.  I then identified the external carotid artery and the superior thyroid artery.  I placed a vessel loop around the superior thyroid artery, and I also dissected out the external carotid artery and placed a vessel loop around it. In the process of this dissection, the hypoglossal nerve was identified and protected from harm.  I then dissected out the internal carotid artery until I identified an area in the internal carotid artery clearly above the stenosis.  I dissected slightly distal to this area, and placed a vessel loop around the artery.  At this point, we gave the patient 6000 units of intravenous heparin.  After this was allowed to circulate for several minutes, I pulled up control on the vessel loops to clamp the internal carotid artery, external carotid artery, superior thyroid artery, and then the common carotid artery.  I then made an arteriotomy in the common carotid artery with a 11 blade, and extended the arteriotomy with a Potts scissor down into the common carotid artery, then I carried the arteriotomy through the bifurcation into the internal carotid artery until I reached an area that was not diseased.  At this point, I took the [place] shunt that previously been prepared and I inserted it  into the internal carotid artery first, and then into the common carotid artery taking care to flush and de-air prior to release of control. At this point, I started the endarterectomy in  the common carotid artery with a [place] elevator and carried this dissection down into the common carotid artery circumferentially.  Then I transected the plaque at a segment where it was adherent and transected the plaque with Potts scissors. The plaque had a significant hemorrhagic appearance in the mid portion and was quite soft and cheesy. I then carried this dissection up into the external carotid artery.  The plaque was extracted by unclamping the external carotid artery and performing an eversion endarterectomy.  The dissection was then carried into the internal carotid artery where a nice feathered end point was created with gentle traction.  I passed the plaque off the field as a specimen. At this point I removed all loose flecks and remaining disease possible.  At this point, I was satisfied that the minimal remaining disease was densely adherent to the wall and wall integrity was intact. The distal endpoint was tacked down with two 7-0 Prolene sutures.  I then fashioned a CorMatrix arterial patch for the artery and sewed it in place with two running stitch of 6-0 Prolene.  I started at the distal endpoint and ran one half the length of the arteriotomy.  I then cut and beveled the patch to an appropriate length to match the arteriotomy.  I started the second 6-0 Prolene at the proximal end point.  The medial suture line was completed and the lateral suture line was run approximately one quarter the length of the arteriotomy.  Prior to completing this patch angioplasty, I removed the shunt first from the internal carotid artery, from which there was excellent backbleeding, and clamped it.  Then I removed the shunt from the common carotid artery, from which there was excellent antegrade bleeding, and then clamped it.  At this point, I allowed the external carotid artery to backbleed, which was excellent.  Then I instilled heparinized saline in this patched artery and then completed the patch angioplasty in  the usual fashion.  First, I released the clamp on the external carotid artery, then I released it on the common carotid artery.  After waiting a few seconds, I then released it on the internal carotid artery. Several minutes of pressure were held and 6-0 Prolene patch sutures were used as need for hemostasis.  At this point, I placed Surgicel and Evicel topical hemostatic agents.  There was no more active bleeding in the surgical site.  The sternocleidomastoid space was closed with three interrupted 3-0 Vicryl sutures. I then reapproximated the platysma muscle with a running stitch of 3-0 Vicryl.  The skin was then closed with a running subcuticular 4-0 Monocryl.  The skin was then cleaned, dried and Dermabond was used to reinforce the skin closure.  The patient awakened and was taken to the recovery room in stable condition, following commands and moving all four extremities without any apparent deficits.    COMPLICATIONS: none  CONDITION: stable  Festus BarrenJason Poseidon Pam  02/18/2019, 9:49 AM    This note was created with Dragon Medical transcription system. Any errors in dictation are purely unintentional.

## 2019-02-18 NOTE — H&P (Signed)
[facility] History & Physical Update  The patient was interviewed and re-examined.  The patient's previous History and Physical has been reviewed and is unchanged.  There is no change in the plan of care. We plan to proceed with the scheduled procedure.  Leotis Pain, MD  02/18/2019, 7:31 AM

## 2019-02-19 DIAGNOSIS — I6521 Occlusion and stenosis of right carotid artery: Secondary | ICD-10-CM

## 2019-02-19 LAB — BASIC METABOLIC PANEL
Anion gap: 10 (ref 5–15)
BUN: 23 mg/dL (ref 8–23)
CO2: 22 mmol/L (ref 22–32)
Calcium: 8.8 mg/dL — ABNORMAL LOW (ref 8.9–10.3)
Chloride: 103 mmol/L (ref 98–111)
Creatinine, Ser: 0.96 mg/dL (ref 0.61–1.24)
GFR calc Af Amer: >60 mL/min (ref >60)
GFR calc non Af Amer: >60 mL/min (ref >60)
Glucose, Bld: 167 mg/dL — ABNORMAL HIGH (ref 70–99)
Potassium: 4.9 mmol/L (ref 3.5–5.1)
Sodium: 135 mmol/L (ref 135–145)

## 2019-02-19 LAB — CBC
HCT: 38.9 % — ABNORMAL LOW (ref 39.0–52.0)
Hemoglobin: 12.9 g/dL — ABNORMAL LOW (ref 13.0–17.0)
MCH: 28.6 pg (ref 26.0–34.0)
MCHC: 33.2 g/dL (ref 30.0–36.0)
MCV: 86.3 fL (ref 80.0–100.0)
Platelets: 165 10*3/uL (ref 150–400)
RBC: 4.51 MIL/uL (ref 4.22–5.81)
RDW: 13.3 % (ref 11.5–15.5)
WBC: 16 10*3/uL — ABNORMAL HIGH (ref 4.0–10.5)
nRBC: 0 % (ref 0.0–0.2)

## 2019-02-19 LAB — SURGICAL PATHOLOGY

## 2019-02-19 LAB — GLUCOSE, CAPILLARY: Glucose-Capillary: 147 mg/dL — ABNORMAL HIGH (ref 70–99)

## 2019-02-19 MED ORDER — OXYCODONE-ACETAMINOPHEN 5-325 MG PO TABS: 1.0000 | ORAL_TABLET | Freq: Four times a day (QID) | ORAL | 0 refills | Status: DC | PRN

## 2019-02-19 MED ORDER — CLOPIDOGREL BISULFATE 75 MG PO TABS: 75.0000 mg | ORAL_TABLET | Freq: Every day | ORAL | 3 refills | Status: DC

## 2019-02-19 MED ORDER — ASPIRIN EC 81 MG PO TBEC: 81.0000 mg | DELAYED_RELEASE_TABLET | Freq: Every day | ORAL | Status: DC

## 2019-02-19 NOTE — Discharge Summary (Signed)
[facility]    Discharge Summary  Patient ID:  Shawn RakersCalvin J Zieske MRN: [id number] DOB/AGE: [birth year] 63 y.o.  Admit date: 02/18/2019 Discharge date: 02/19/2019 Date of Surgery: 02/18/2019 Surgeon: Surgeon(s): Wyn Quakerew, Marlow BaarsJason S, MD  Admission Diagnosis: CAROTID ARTERY STENOSIS  Discharge Diagnoses:  CAROTID ARTERY STENOSIS  Secondary Diagnoses: Past Medical History:  Diagnosis Date  . Anginal pain (HCC) 08/2018   a. 08/2018 - atyp c/p and neg trop; b. 08/2018 MV: EF 57%, inferolateral horizontal ST depression w/o ischemia or infarct.  . Arthritis   . Carotid arterial disease (HCC)    a. 01/2019 Carotid U/S: RICA 80-90, RCCA <50, RECA>50, LICA 80-99, LCCA>50, LECA>50; b. 01/2019 CT Head/Neck: RICA string sign in bulb - lumen <911mm. Sev prox RECA dzs. LICA 60 @ dist bulb. R vert 50-70, L vert 30-50.  . Carotid bruit present 08/2018   left  . Chewing tobacco nicotine dependence   . Essential hypertension 03/29/2017  . GERD (gastroesophageal reflux disease)   . History of kidney stones   . Hyperlipidemia    Procedure(s): ENDARTERECTOMY CAROTID (RIGHT)  Discharged Condition: Good  HPI / Hospital Course:  Shawn RakersCalvin J Burnette is a 63 y.o. male is S/P Right  Procedure(s): ENDARTERECTOMY CAROTID  The patient is a 63 year old male with multiple medical issues (see above) who presented with dizziness and lightheadedness but no focal neurologic symptoms.  He denied any previous history of stroke or TIA to his knowledge. It was noted by his cardiologist that he had a left carotid bruit present and he recently underwent a carotid ultrasound and a carotid CT angiogram notable for a duplex having velocities that would fall in the 80 to 99% range bilaterally. The CT angiogram shows a string sign in the right carotid artery consistent with a greater than 90% stenosis.   On 02/18/19, the patient underwent: 1.  Right carotid endarterectomy with CorMatrix arterial patch  reconstruction  He tolerated the procedure well was transferred to the recovery room then the ICU for observation overnight.  During the patient's brief inpatient stay, his diet was slowly advanced, his Foley was removed and he was voiding independently, his pain was controlled to the use of p.o. pain medication and he was ambulating independently.  Upon discharge, the patient was afebrile with stable vital signs.  The patient's inpatient stay/procedure was without complication.  The patient was discharged home postop day #1.  Extubated: POD # 0  Physical exam:  A&Ox3, NAD Face: Symmetrical.  Tongue is midline. Neck:  Incision: Dry and intact.  Dermabond intact.  There is no swelling or ecchymosis noted.  Trachea is midline. Cards: Regular rate and rhythm Pulmonary: Clear to auscultation bilaterally Abdomen: Soft, nontender, nondistended, positive bowel sounds GU: Foley has been removed, urinating independently Extremities: Warm distally.  No edema. Neuro: Upper/lower, right/left 5/5, motor/sensory intact  Labs as below  Complications:none  Consults: None  Significant Diagnostic Studies: CBC Lab Results  Component Value Date   WBC 16.0 (H) 02/19/2019   HGB 12.9 (L) 02/19/2019   HCT 38.9 (L) 02/19/2019   MCV 86.3 02/19/2019   PLT 165 02/19/2019   BMET    Component Value Date/Time   NA 135 02/19/2019 0524   K 4.9 02/19/2019 0524   CL 103 02/19/2019 0524   CO2 22 02/19/2019 0524   GLUCOSE 167 (H) 02/19/2019 0524   BUN 23 02/19/2019 0524   CREATININE 0.96 02/19/2019 0524   CALCIUM 8.8 (L) 02/19/2019 0524   GFRNONAA >60 02/19/2019 [id number]  GFRAA >60 02/19/2019 0524   COAG Lab Results  Component Value Date   INR 1.0 02/13/2019   Disposition:  Discharge to :Home  Allergies as of 02/19/2019   No Known Allergies     Medication List    TAKE these medications   amLODipine 10 MG tablet Commonly known as: NORVASC Take 1 tablet (10 mg total) by mouth daily.    aspirin 81 MG chewable tablet Chew 81 mg by mouth daily.   atorvastatin 80 MG tablet Commonly known as: LIPITOR Take 1 tablet (80 mg total) by mouth daily. What changed: when to take this   clopidogrel 75 MG tablet Commonly known as: PLAVIX Take 1 tablet (75 mg total) by mouth daily with breakfast. Start taking on: February 20, 2019   esomeprazole 40 MG capsule Commonly known as: NEXIUM One daily What changed:   how much to take  how to take this  when to take this   oxyCODONE-acetaminophen 5-325 MG tablet Commonly known as: PERCOCET/ROXICET Take 1 tablet by mouth every 6 (six) hours as needed for moderate pain or severe pain (Pain).      Verbal and written Discharge instructions given to the patient. Wound care per Discharge AVS Follow-up Information    Kris Hartmann, NP Follow up in 1 week(s).   Specialty: Vascular Surgery Why: First post-op visit. Incision check.  Contact information: [address] [place] [address] [phone]        Kris Hartmann, NP In 1 week.   Specialty: Vascular Surgery Why: AUG  Contact information: [place] [address] [phone]        Kris Hartmann, NP.   Specialty: Vascular Surgery Contact information: [place] [address] [phone]        Kris Hartmann, NP In 1 week.   Specialty: Vascular Surgery Why: Feb 26, 2019 AT 0945AM CHECK INCISION Contact information: [place] [address] [phone]          Signed: Sela Hua, PA-C  02/19/2019, 12:13 PM

## 2019-02-19 NOTE — Discharge Instructions (Signed)
You may shower as of tomorrow (Friday). Gently clean incision with soap and water. Gently pat dry.  Dermabond to fall off on its own. NO driving or drinking alcohol while on pain medication. NO driving or lifting heavier than 10 pounds until seen for your first post-op visit.

## 2019-02-19 NOTE — Anesthesia Postprocedure Evaluation (Signed)
Anesthesia Post Note  Patient: Shawn Sherman  Procedure(s) Performed: ENDARTERECTOMY CAROTID (Right )  Patient location during evaluation: SICU Anesthesia Type: General Level of consciousness: awake and alert Pain management: pain level controlled Vital Signs Assessment: post-procedure vital signs reviewed and stable Respiratory status: spontaneous breathing and nonlabored ventilation Cardiovascular status: stable Postop Assessment: no apparent nausea or vomiting Anesthetic complications: no     Last Vitals:  Vitals:   02/19/19 0800 02/19/19 0900  BP: (!) 102/58 108/60  Pulse: (!) 50 (!) 53  Resp: 12   Temp: 36.9 C   SpO2: 97% 98%    Last Pain:  Vitals:   02/19/19 0800  TempSrc: Oral  PainSc: 0-No pain                 Rolla Plate P

## 2019-02-26 ENCOUNTER — Encounter (INDEPENDENT_AMBULATORY_CARE_PROVIDER_SITE_OTHER): Payer: Self-pay | Admitting: Nurse Practitioner

## 2019-02-26 ENCOUNTER — Other Ambulatory Visit: Payer: Self-pay

## 2019-02-26 ENCOUNTER — Encounter (INDEPENDENT_AMBULATORY_CARE_PROVIDER_SITE_OTHER): Payer: Self-pay | Admitting: Vascular Surgery

## 2019-02-26 ENCOUNTER — Ambulatory Visit (INDEPENDENT_AMBULATORY_CARE_PROVIDER_SITE_OTHER): Payer: 59 | Admitting: Nurse Practitioner

## 2019-02-26 VITALS — BP 133/82 | HR 82 | Resp 12 | Ht 70.0 in | Wt 178.0 lb

## 2019-02-26 DIAGNOSIS — E785 Hyperlipidemia, unspecified: Secondary | ICD-10-CM

## 2019-02-26 DIAGNOSIS — I6521 Occlusion and stenosis of right carotid artery: Secondary | ICD-10-CM

## 2019-02-26 DIAGNOSIS — I10 Essential (primary) hypertension: Secondary | ICD-10-CM

## 2019-02-26 NOTE — Progress Notes (Signed)
SUBJECTIVE:  Patient ID: Shawn Sherman, male    DOB: 1956/03/07, 63 y.o.   MRN: [id number] Chief Complaint  Patient presents with  . Follow-up    HPI  Shawn Sherman is a 63 y.o. male recently underwent right carotid endarterectomy on 02/18/2019.  The patient states that he had some swelling, numbness and headaches following the procedure.  He continues to have some headaches however they are not as intense.  The patient does endorse some numbness of the right side of the face with a little bit of swelling.  He states that he is not having difficulty swallowing but he states he has to give it a conscious effort sometimes.  There are no signs symptoms of infection such as drainage, erythematous edges, or foul-smelling odor.  Patient has any fever, chills, nausea, vomiting or diarrhea.  He denies any chest pain shortness of breath.  Overall he is doing well post procedure.  Past Medical History:  Diagnosis Date  . Anginal pain ([place]) 08/2018   a. 08/2018 - atyp c/p and neg trop; b. 08/2018 MV: EF 57%, inferolateral horizontal ST depression w/o ischemia or infarct.  . Arthritis   . Carotid arterial disease ([place])    a. 01/2019 Carotid U/S: RICA 80-90, RCCA <10, FBPZ>02, LICA 58-52, DPOE>42, LECA>50; b. 01/2019 CT Head/Neck: RICA string sign in bulb - lumen <48mm. Sev prox RECA dzs. LICA 60 @ dist bulb. R vert 50-70, L vert 30-50.  . Carotid bruit present 08/2018   left  . Chewing tobacco nicotine dependence   . Essential hypertension 03/29/2017  . GERD (gastroesophageal reflux disease)   . History of kidney stones   . Hyperlipidemia     Past Surgical History:  Procedure Laterality Date  . ANTERIOR LAT LUMBAR FUSION Right 01/01/2013   Procedure: lumbar three-four extreme lateral interbody fusion;  Surgeon: Faythe Ghee, MD;  Location: [facility];  Service: Neurosurgery;  Laterality: Right;  Lumbar three-four extreme lateral interbody fusion    . BACK SURGERY  08  . COLONOSCOPY    .  ENDARTERECTOMY Right 02/18/2019   Procedure: ENDARTERECTOMY CAROTID;  Surgeon: Algernon Huxley, MD;  Location: [facility];  Service: Vascular;  Laterality: Right;  . ESOPHAGOGASTRODUODENOSCOPY (EGD) WITH PROPOFOL N/A 07/05/2015   Procedure: ESOPHAGOGASTRODUODENOSCOPY (EGD) WITH PROPOFOL;  Surgeon: Lucilla Lame, MD;  Location: [facility];  Service: Endoscopy;  Laterality: N/A;  . EXTRACORPOREAL SHOCK WAVE LITHOTRIPSY Right 09/11/2018   Procedure: EXTRACORPOREAL SHOCK WAVE LITHOTRIPSY (ESWL);  Surgeon: Abbie Sons, MD;  Location: [facility] OR[facility];  Service: Urology;  Laterality: Right;  . LUMBAR PERCUTANEOUS PEDICLE SCREW 1 LEVEL N/A 01/01/2013   Procedure: lumbar three-four percutaneous pedicle screws ;  Surgeon: Faythe Ghee, MD;  Location: [place] [facility];  Service: Neurosurgery;  Laterality: N/A;  lumbar three-four percutaneous pedicle screws    Social History   Socioeconomic History  . Marital status: Married    Spouse name: Zigmund Daniel  . Number of children: Not on file  . Years of education: Not on file  . Highest education level: Not on file  Occupational History  . Not on file  Social Needs  . Financial resource strain: Not on file  . Food insecurity    Worry: Not on file    Inability: Not on file  . Transportation needs    Medical: Not on file    Non-medical: Not on file  Tobacco Use  . Smoking status: Never Smoker  . Smokeless tobacco: Current User  Types: Snuff  Substance and Sexual Activity  . Alcohol use: No  . Drug use: No  . Sexual activity: Yes    Birth control/protection: None  Lifestyle  . Physical activity    Days per week: Not on file    Minutes per session: Not on file  . Stress: Not on file  Relationships  . Social Musicianconnections    Talks on phone: Not on file    Gets together: Not on file    Attends religious service: Not on file    Active member of club or organization: Not on file    Attends meetings of clubs or organizations: Not on file    Relationship status:  Not on file  . Intimate partner violence    Fear of current or ex partner: Not on file    Emotionally abused: Not on file    Physically abused: Not on file    Forced sexual activity: Not on file  Other Topics Concern  . Not on file  Social History Narrative  . Not on file    Family History  Problem Relation Age of Onset  . Cancer Mother   . Cancer Father   . COPD Sister     No Known Allergies   Review of Systems   Review of Systems: Negative Unless Checked Constitutional: [] Weight loss  [] Fever  [] Chills Cardiac: [] Chest pain   []  Atrial Fibrillation  [] Palpitations   [] Shortness of breath when laying flat   [] Shortness of breath with exertion. [] Shortness of breath at rest Vascular:  [] Pain in legs with walking   [] Pain in legs with standing [] Pain in legs when laying flat   [] Claudication    [] Pain in feet when laying flat    [] History of DVT   [] Phlebitis   [] Swelling in legs   [] Varicose veins   [] Non-healing ulcers Pulmonary:   [] Uses home oxygen   [] Productive cough   [] Hemoptysis   [] Wheeze  [] COPD   [] Asthma Neurologic:  [] Dizziness   [] Seizures  [] Blackouts [] History of stroke   [] History of TIA  [] Aphasia   [] Temporary Blindness   [] Weakness or numbness in arm   [] Weakness or numbness in leg Musculoskeletal:   [] Joint swelling   [] Joint pain   [] Low back pain  [x]  History of Knee Replacement [] Arthritis [] back Surgeries  []  Spinal Stenosis    Hematologic:  [] Easy bruising  [] Easy bleeding   [] Hypercoagulable state   [] Anemic Gastrointestinal:  [] Diarrhea   [] Vomiting  [x] Gastroesophageal reflux/heartburn   [] Difficulty swallowing. [] Abdominal pain Genitourinary:  [] Chronic kidney disease   [] Difficult urination  [] Anuric   [] Blood in urine [] Frequent urination  [] Burning with urination   [] Hematuria Skin:  [] Rashes   [] Ulcers [x] Wounds Psychological:  [] History of anxiety   []  History of major depression  []  Memory Difficulties      OBJECTIVE:   Physical Exam  BP  133/82 (BP Location: Left Arm, Patient Position: Sitting, Cuff Size: Normal)   Pulse 82   Resp 12   Ht 5\' 10"  (1.778 m)   Wt 178 lb (80.7 kg)   BMI 25.54 kg/m   Gen: WD/WN, NAD Head: [place]/AT, No temporalis wasting.  Ear/Nose/Throat: Hearing grossly intact, nares w/o erythema or drainage Eyes: PER, EOMI, sclera nonicteric.  Neck: Supple, no masses.  No JVD.  Pulmonary:  Good air movement, no use of accessory muscles.  Cardiac: RRR Vascular:  Incision on right neck, clean dry and intact. Vessel Right Left  Radial Palpable Palpable  Gastrointestinal: soft, non-distended. No guarding/no peritoneal signs.  Musculoskeletal: M/S 5/5 throughout.  No deformity or atrophy.  Neurologic: Pain and light touch intact in extremities.  Symmetrical.  Speech is fluent. Motor exam as listed above. Psychiatric: Judgment intact, Mood & affect appropriate for pt's clinical situation. Dermatologic: No Venous rashes. No Ulcers Noted.  No changes consistent with cellulitis. Lymph : No Cervical lymphadenopathy, no lichenification or skin changes of chronic lymphedema.       ASSESSMENT AND PLAN:  1. Carotid stenosis, right Patient incision site looks well today.  The patient will return in 6 to 8 weeks for a carotid duplex to evaluate the status of his right carotid endarterectomy.  We have also discussed the possibility of moving forward with a left carotid endarterectomy as previous CT scan did show elevated stenosis. - VAS US CAROTID; Future  2. Essential hypertension Continue antihypertensive medications as already ordered, these medications have been reviewed and there are no changes at this time.   3. Hyperlipidemia, unspecified hyperlipidemia type Continue statin as ordered and reviewed, no changes at this time    Current Outpatient Medications on File Prior to Visit  Medication Sig Dispense Refill  . amLODipine (NORVASC) 10 MG tablet Take 1 tablet (10 mg total) by mouth daily. 90 tablet 3   . aspirin 81 MG chewable tablet Chew 81 mg by mouth daily.    Marland Kitchen. atorvastatin (LIPITOR) 80 MG tablet Take 1 tablet (80 mg total) by mouth daily. (Patient taking differently: Take 80 mg by mouth every evening. ) 90 tablet 3  . clopidogrel (PLAVIX) 75 MG tablet Take 1 tablet (75 mg total) by mouth daily with breakfast. 90 tablet 3  . esomeprazole (NEXIUM) 40 MG capsule One daily (Patient taking differently: Take 40 mg by mouth every evening. One daily) 90 capsule 0  . oxyCODONE-acetaminophen (PERCOCET/ROXICET) 5-325 MG tablet Take 1 tablet by mouth every 6 (six) hours as needed for moderate pain or severe pain (Pain). 15 tablet 0   No current facility-administered medications on file prior to visit.     There are no Patient Instructions on file for this visit. No follow-ups on file.   Georgiana SpinnerFallon E Leeza Heiner, NP  This note was completed with Office managerDragon Dictation.  Any errors are purely unintentional.

## 2019-03-21 ENCOUNTER — Other Ambulatory Visit: Payer: Self-pay | Admitting: Physician Assistant

## 2019-03-21 DIAGNOSIS — K209 Esophagitis, unspecified without bleeding: Secondary | ICD-10-CM

## 2019-03-23 ENCOUNTER — Other Ambulatory Visit: Payer: Self-pay | Admitting: Family Medicine

## 2019-03-23 DIAGNOSIS — E782 Mixed hyperlipidemia: Secondary | ICD-10-CM

## 2019-03-23 MED ORDER — ATORVASTATIN CALCIUM 80 MG PO TABS: 80.0000 mg | ORAL_TABLET | Freq: Every day | ORAL | 3 refills | Status: DC

## 2019-03-23 NOTE — Telephone Encounter (Signed)
[facility] faxed refill request for the following medications:  atorvastatin (LIPITOR) 80 MG tablet  90 day supply  LOV: 02/2018 with Dr. Jacinto Reap NOV: 04/20/2019 Please advise. Thanks TNP

## 2019-04-14 ENCOUNTER — Other Ambulatory Visit: Payer: Self-pay

## 2019-04-14 ENCOUNTER — Ambulatory Visit (INDEPENDENT_AMBULATORY_CARE_PROVIDER_SITE_OTHER): Payer: 59 | Admitting: Vascular Surgery

## 2019-04-14 ENCOUNTER — Encounter (INDEPENDENT_AMBULATORY_CARE_PROVIDER_SITE_OTHER): Payer: Self-pay | Admitting: Vascular Surgery

## 2019-04-14 ENCOUNTER — Ambulatory Visit (INDEPENDENT_AMBULATORY_CARE_PROVIDER_SITE_OTHER): Payer: 59

## 2019-04-14 VITALS — BP 143/76 | HR 67 | Resp 12 | Ht 70.0 in | Wt 174.0 lb

## 2019-04-14 DIAGNOSIS — E785 Hyperlipidemia, unspecified: Secondary | ICD-10-CM

## 2019-04-14 DIAGNOSIS — I10 Essential (primary) hypertension: Secondary | ICD-10-CM

## 2019-04-14 DIAGNOSIS — I6521 Occlusion and stenosis of right carotid artery: Secondary | ICD-10-CM

## 2019-04-14 DIAGNOSIS — I6523 Occlusion and stenosis of bilateral carotid arteries: Secondary | ICD-10-CM

## 2019-04-14 NOTE — Assessment & Plan Note (Signed)
lipid control important in reducing the progression of atherosclerotic disease. Continue statin therapy  

## 2019-04-14 NOTE — Assessment & Plan Note (Signed)
His carotid duplex today reveals a widely patent right carotid endarterectomy.  His velocities are actually better on the left side and would fall in the upper end of the 60 to 79% range. We had a long discussion regarding treatment options for his carotid disease.  He has done well from his right carotid endarterectomy.  He has a high-grade to at least borderline high-grade lesion on the left and he is quite young with a lower risk profile than most patients which would indicate an expected longer general survival.  As such, I think it is reasonable to offer a carotid endarterectomy for someone in the 70 to 75% range on the left as he likely is.  His previous duplex prior to right carotid endarterectomy suggested 80 to 99% stenosis, but his velocities were likely increased to compensate for the string sign on the right.  He does clearly still have a significant carotid lesion with long-term stroke risk present an endarterectomy would reduce his long-term stroke risk.  The patient is agreeable to proceed but would like another month or 2 to recover from his right side which is reasonable.  This will also allow more time to pass and reduce the reperfusion risks.  We will plan on left carotid endarterectomy in about 2 months.

## 2019-04-14 NOTE — Assessment & Plan Note (Signed)
blood pressure control important in reducing the progression of atherosclerotic disease. On appropriate oral medications.  

## 2019-04-14 NOTE — Patient Instructions (Signed)
Carotid Endarterectomy, Care After This sheet gives you information about how to care for yourself after your procedure. Your health care provider may also give you more specific instructions. If you have problems or questions, contact your health care provider. What can I expect after the procedure? After the procedure, it is common to have some pain or an ache in your neck for up to 2 weeks. This is normal. Follow these instructions at home: Medicines  Take over-the-counter and prescription medicines only as told by your health care provider.  If you are given a blood thinner (anticoagulant) after surgery, take it exactly as told.  Do not drive for 24 hours if you were given a sedative during your procedure.  Do not drive or use heavy machinery while taking prescription pain medicine. Incision care   Follow instructions from your health care provider about how to take care of your incision. Make sure you: ? Wash your hands with soap and water before and after you change your bandage (dressing). If soap and water are not available, use hand sanitizer. ? Change your dressing as told by your health care provider. ? Leave stitches (sutures), skin glue, or adhesive strips in place. These skin closures may need to stay in place for 2 weeks or longer. If adhesive strip edges start to loosen and curl up, you may trim the loose edges. Do not remove adhesive strips completely unless your health care provider tells you to do that.  Check your incision area every day for signs of infection. Check for: ? Redness, swelling, or pain. ? Fluid or blood. ? Warmth. ? Pus or a bad smell.  Do not take baths, swim, or use a hot tub until your health care provider approves. Ask your health care provider if you may take showers. You may only be allowed to take sponge baths. Activity  Do not lift anything that is heavier than 10 lb (4.5 kg), or the limit that you are told, until your health care provider  says that it is safe.  Return to your normal activities as told by your health care provider. Ask your health care provider what activities are safe for you. ? Recovery time varies depending on your age, general health, and other factors. You will likely be able to return to a normal lifestyle within a few weeks. While you recover, you may need help with some activities, such as cleaning the house or shopping.  Exercise regularly, or as told by your health care provider. Eating and drinking   Follow instructions from your health care provider about eating or drinking restrictions.  Drink enough fluid to keep your urine pale yellow.  Eat a heart-healthy diet. This includes foods like fresh fruits and vegetables, whole grains, low-fat dairy products, and low-fat (lean) meats. Avoid foods that are: ? High in salt, saturated fat, or sugar. ? Canned or highly processed. ? Fried. Lifestyle  If you drink alcohol: ? Limit how much you use to:  0-1 drink a day for women.  0-2 drinks a day for men. ? Be aware of how much alcohol is in your drink. In the [place] one drink equals one 12 oz bottle of beer (355 mL), one 5 oz glass of wine (148 mL), or one 1 oz glass of hard liquor (44 mL).  Maintain a healthy weight. General instructions  Avoid wearing tight clothing around your neck and your sutures.  Work with your health care provider to keep your blood pressure under control.  Do not use any products that contain nicotine or tobacco, such as cigarettes, e-cigarettes, and chewing tobacco. If you need help quitting, ask your health care provider.  Keep all follow-up visits as told by your health care provider. This is important. Contact a health care provider if you have:  Signs of infection, such as: ? Redness, swelling, or pain around your incision. ? Fluid or blood coming from your incision. ? Your incision feeling warm to the touch. ? Pus or a bad smell coming from your incision. ?  A fever.  A rash.  Difficulty speaking or you have changes in your voice. Get help right away if you have:  Difficulty breathing.  Chest pain or shortness of breath.  Any symptoms of a stroke. Certain factors may put you at risk for a stroke even after this procedure. These may include chronic lung disease, chronic kidney disease, narrowed arteries (peripheral arterial disease), previous history of a stroke, and being 76 years of age or older. "BE FAST" is an easy way to remember the main warning signs of a stroke: ? B - Balance. Signs are dizziness, sudden trouble walking, or loss of balance. ? E - Eyes. Signs are trouble seeing or a sudden change in vision. ? F - Face. Signs are sudden weakness or numbness of the face, or the face or eyelid drooping on one side. ? A - Arms. Signs are weakness or numbness in an arm. This happens suddenly and usually on one side of the body. ? S - Speech. Signs are sudden trouble speaking, slurred speech, or trouble understanding what people say. ? T - Time. Time to call emergency services. Write down what time symptoms started.  Other signs of a stroke, such as: ? A sudden, severe headache with no known cause. ? Nausea or vomiting. ? Seizure. These symptoms may represent a serious problem that is an emergency. Do not wait to see if the symptoms will go away. Get medical help right away. Call your local emergency services (911 in the [place] Do not drive yourself to the hospital. Summary  After the procedure, it is common to have some pain or an ache in your neck for up to 2 weeks.  Follow instructions from your health care provider about how to take care of your incision.  Take over-the-counter and prescription medicines only as told by your health care provider.  Do not use any products that contain nicotine or tobacco, such as cigarettes, e-cigarettes, and chewing tobacco. If you need help quitting, ask your health care provider.  Keep all  follow-up visits as told by your health care provider. This is important. This information is not intended to replace advice given to you by your health care provider. Make sure you discuss any questions you have with your health care provider. Document Released: 01/12/2005 Document Revised: 08/04/2018 Document Reviewed: 03/04/2018 Elsevier Patient Education  2020 [employer].

## 2019-04-14 NOTE — Progress Notes (Signed)
Patient ID: Shawn Sherman, male   DOB: [birth year], 63 y.o.   MRN: [id number]  Chief Complaint  Patient presents with  . Follow-up    HPI Shawn Sherman is a 63 y.o. male.  Patient returns almost 2 months after right carotid endarterectomy for bilateral high-grade stenosis.  He still has some numbness in his neck and some soreness from time to time.  He was several weeks before he felt well.  He continues to have some headaches intermittently.  His carotid duplex today reveals a widely patent right carotid endarterectomy.  His velocities are actually better on the left side and would fall in the upper end of the 60 to 79% range.   Past Medical History:  Diagnosis Date  . Anginal pain ([place]) 08/2018   a. 08/2018 - atyp c/p and neg trop; b. 08/2018 MV: EF 57%, inferolateral horizontal ST depression w/o ischemia or infarct.  . Arthritis   . Carotid arterial disease ([place])    a. 01/2019 Carotid U/S: RICA 80-90, RCCA <40, JWJX>91, LICA 47-82, NFAO>13, LECA>50; b. 01/2019 CT Head/Neck: RICA string sign in bulb - lumen <79mm. Sev prox RECA dzs. LICA 60 @ dist bulb. R vert 50-70, L vert 30-50.  . Carotid bruit present 08/2018   left  . Chewing tobacco nicotine dependence   . Essential hypertension 03/29/2017  . GERD (gastroesophageal reflux disease)   . History of kidney stones   . Hyperlipidemia     Past Surgical History:  Procedure Laterality Date  . ANTERIOR LAT LUMBAR FUSION Right 01/01/2013   Procedure: lumbar three-four extreme lateral interbody fusion;  Surgeon: Faythe Ghee, MD;  Location: [facility];  Service: Neurosurgery;  Laterality: Right;  Lumbar three-four extreme lateral interbody fusion    . BACK SURGERY  08  . COLONOSCOPY    . ENDARTERECTOMY Right 02/18/2019   Procedure: ENDARTERECTOMY CAROTID;  Surgeon: Algernon Huxley, MD;  Location: [facility];  Service: Vascular;  Laterality: Right;  . ESOPHAGOGASTRODUODENOSCOPY (EGD) WITH PROPOFOL N/A 07/05/2015   Procedure:  ESOPHAGOGASTRODUODENOSCOPY (EGD) WITH PROPOFOL;  Surgeon: Lucilla Lame, MD;  Location: [facility];  Service: Endoscopy;  Laterality: N/A;  . EXTRACORPOREAL SHOCK WAVE LITHOTRIPSY Right 09/11/2018   Procedure: EXTRACORPOREAL SHOCK WAVE LITHOTRIPSY (ESWL);  Surgeon: Abbie Sons, MD;  Location: [facility];  Service: Urology;  Laterality: Right;  . LUMBAR PERCUTANEOUS PEDICLE SCREW 1 LEVEL N/A 01/01/2013   Procedure: lumbar three-four percutaneous pedicle screws ;  Surgeon: Faythe Ghee, MD;  Location: [place] [facility];  Service: Neurosurgery;  Laterality: N/A;  lumbar three-four percutaneous pedicle screws      No Known Allergies  Current Outpatient Medications  Medication Sig Dispense Refill  . amLODipine (NORVASC) 10 MG tablet Take 1 tablet (10 mg total) by mouth daily. 90 tablet 3  . aspirin 81 MG chewable tablet Chew 81 mg by mouth daily.    Marland Kitchen atorvastatin (LIPITOR) 80 MG tablet Take 1 tablet (80 mg total) by mouth daily. 90 tablet 3  . clopidogrel (PLAVIX) 75 MG tablet Take 1 tablet (75 mg total) by mouth daily with breakfast. 90 tablet 3  . esomeprazole (NEXIUM) 40 MG capsule Take 1 capsule (40 mg total) by mouth every evening. One daily 90 capsule 0  . oxyCODONE-acetaminophen (PERCOCET/ROXICET) 5-325 MG tablet Take 1 tablet by mouth every 6 (six) hours as needed for moderate pain or severe pain (Pain). 15 tablet 0   No current facility-administered medications for this visit.  Physical Exam BP (!) 143/76 (BP Location: Left Arm, Patient Position: Sitting, Cuff Size: Normal)   Pulse 67   Resp 12   Ht 5\' 10"  (1.778 m)   Wt 174 lb (78.9 kg)   BMI 24.97 kg/m  Gen:  WD/WN, NAD Skin: incision C/D/I Neuro: Motor and sensory function intact throughout.  No focal deficits noted.     Assessment/Plan:  Hyperlipidemia lipid control important in reducing the progression of atherosclerotic disease. Continue statin therapy   Essential hypertension blood pressure control  important in reducing the progression of atherosclerotic disease. On appropriate oral medications.   Carotid stenosis His carotid duplex today reveals a widely patent right carotid endarterectomy.  His velocities are actually better on the left side and would fall in the upper end of the 60 to 79% range. We had a long discussion regarding treatment options for his carotid disease.  He has done well from his right carotid endarterectomy.  He has a high-grade to at least borderline high-grade lesion on the left and he is quite young with a lower risk profile than most patients which would indicate an expected longer general survival.  As such, I think it is reasonable to offer a carotid endarterectomy for someone in the 70 to 75% range on the left as he likely is.  His previous duplex prior to right carotid endarterectomy suggested 80 to 99% stenosis, but his velocities were likely increased to compensate for the string sign on the right.  He does clearly still have a significant carotid lesion with long-term stroke risk present an endarterectomy would reduce his long-term stroke risk.  The patient is agreeable to proceed but would like another month or 2 to recover from his right side which is reasonable.  This will also allow more time to pass and reduce the reperfusion risks.  We will plan on left carotid endarterectomy in about 2 months.      04/14/2019, 3:50 PM   This note was created with Dragon medical transcription system.  Any errors from dictation are unintentional.

## 2019-04-17 ENCOUNTER — Telehealth (INDEPENDENT_AMBULATORY_CARE_PROVIDER_SITE_OTHER): Payer: Self-pay

## 2019-04-17 NOTE — Telephone Encounter (Signed)
I spoke with the patient on 04/16/2019 and he is now scheduled to have his CEA surgery on 06/11/2019 with Dr. Lucky Cowboy. Patient will do his pre-op on 06/02/2019 at 8:00 am and will do his Covid testing on 06/08/2019 between 12:30-2:30 pm at the [facility]. Pre-surgical instructions were discussed and will be mailed to the patient.

## 2019-04-20 ENCOUNTER — Encounter: Payer: Self-pay | Admitting: Family Medicine

## 2019-04-20 NOTE — Progress Notes (Deleted)
Patient: Shawn Sherman, Male    DOB: March 13, 1956, 63 y.o.   MRN: [id number] Visit Date: 04/20/2019  Today's Provider: Shirlee Latch, MD   No chief complaint on file.  Subjective:    Annual physical exam Shawn Sherman is a 63 y.o. male who presents today for health maintenance and complete physical. He feels {DESC; WELL/FAIRLY WELL/POORLY:18703}. He reports exercising ***. He reports he is sleeping {DESC; WELL/FAIRLY WELL/POORLY:18703}.  ----------------------------------------------------------------- Last colonoscopy:03/30/2008   Review of Systems  Constitutional: Negative.   HENT: Negative.   Eyes: Negative.   Respiratory: Negative.   Cardiovascular: Negative.   Gastrointestinal: Negative.   Endocrine: Negative.   Genitourinary: Negative.   Musculoskeletal: Negative.   Skin: Negative.   Allergic/Immunologic: Negative.   Neurological: Negative.   Hematological: Negative.   Psychiatric/Behavioral: Negative.     Social History He  reports that he has never smoked. His smokeless tobacco use includes snuff. He reports that he does not drink alcohol or use drugs. Social History   Socioeconomic History  . Marital status: Married    Spouse name: Joyce Gross  . Number of children: Not on file  . Years of education: Not on file  . Highest education level: Not on file  Occupational History  . Not on file  Social Needs  . Financial resource strain: Not on file  . Food insecurity    Worry: Not on file    Inability: Not on file  . Transportation needs    Medical: Not on file    Non-medical: Not on file  Tobacco Use  . Smoking status: Never Smoker  . Smokeless tobacco: Current User    Types: Snuff  Substance and Sexual Activity  . Alcohol use: No  . Drug use: No  . Sexual activity: Yes    Birth control/protection: None  Lifestyle  . Physical activity    Days per week: Not on file    Minutes per session: Not on file  . Stress: Not on file  Relationships  .  Social Musician on phone: Not on file    Gets together: Not on file    Attends religious service: Not on file    Active member of club or organization: Not on file    Attends meetings of clubs or organizations: Not on file    Relationship status: Not on file  Other Topics Concern  . Not on file  Social History Narrative  . Not on file    Patient Active Problem List   Diagnosis Date Noted  . Carotid stenosis, right 02/18/2019  . Carotid stenosis 01/30/2019  . Nephrolithiasis 12/29/2018  . Essential hypertension 03/29/2017  . History of hay fever 06/17/2015  . Acid reflux 06/17/2015  . Hyperlipidemia 06/17/2015    Past Surgical History:  Procedure Laterality Date  . ANTERIOR LAT LUMBAR FUSION Right 01/01/2013   Procedure: lumbar three-four extreme lateral interbody fusion;  Surgeon: Reinaldo Meeker, MD;  Location: [facility];  Service: Neurosurgery;  Laterality: Right;  Lumbar three-four extreme lateral interbody fusion    . BACK SURGERY  08  . COLONOSCOPY    . ENDARTERECTOMY Right 02/18/2019   Procedure: ENDARTERECTOMY CAROTID;  Surgeon: Annice Needy, MD;  Location: [facility];  Service: Vascular;  Laterality: Right;  . ESOPHAGOGASTRODUODENOSCOPY (EGD) WITH PROPOFOL N/A 07/05/2015   Procedure: ESOPHAGOGASTRODUODENOSCOPY (EGD) WITH PROPOFOL;  Surgeon: Midge Minium, MD;  Location: [facility];  Service: Endoscopy;  Laterality: N/A;  . EXTRACORPOREAL  SHOCK WAVE LITHOTRIPSY Right 09/11/2018   Procedure: EXTRACORPOREAL SHOCK WAVE LITHOTRIPSY (ESWL);  Surgeon: Abbie Sons, MD;  Location: [facility];  Service: Urology;  Laterality: Right;  . LUMBAR PERCUTANEOUS PEDICLE SCREW 1 LEVEL N/A 01/01/2013   Procedure: lumbar three-four percutaneous pedicle screws ;  Surgeon: Faythe Ghee, MD;  Location: [facility];  Service: Neurosurgery;  Laterality: N/A;  lumbar three-four percutaneous pedicle screws    Family History  Family Status  Relation Name Status  . Mother   Deceased  . Father  Deceased  . Sister  (Not Specified)   His family history includes COPD in his sister; Cancer in his father and mother.     No Known Allergies  Previous Medications   AMLODIPINE (NORVASC) 10 MG TABLET    Take 1 tablet (10 mg total) by mouth daily.   ASPIRIN 81 MG CHEWABLE TABLET    Chew 81 mg by mouth daily.   ATORVASTATIN (LIPITOR) 80 MG TABLET    Take 1 tablet (80 mg total) by mouth daily.   CLOPIDOGREL (PLAVIX) 75 MG TABLET    Take 1 tablet (75 mg total) by mouth daily with breakfast.   ESOMEPRAZOLE (NEXIUM) 40 MG CAPSULE    Take 1 capsule (40 mg total) by mouth every evening. One daily   OXYCODONE-ACETAMINOPHEN (PERCOCET/ROXICET) 5-325 MG TABLET    Take 1 tablet by mouth every 6 (six) hours as needed for moderate pain or severe pain (Pain).    Patient Care Team: Virginia Crews, MD as PCP - General (Family Medicine) Wellington Hampshire, MD as PCP - Cardiology (Cardiology)      Objective:   Vitals: There were no vitals taken for this visit.   Physical Exam   Depression Screen PHQ 2/9 Scores 03/29/2017  PHQ - 2 Score 2  PHQ- 9 Score 7      Assessment & Plan:     Routine Health Maintenance and Physical Exam  Exercise Activities and Dietary recommendations Goals   None     Immunization History  Administered Date(s) Administered  . Td 06/25/2011  . Tdap 06/25/2011    Health Maintenance  Topic Date Due  . HIV Screening  03/06/1971  . COLONOSCOPY  03/30/2018  . INFLUENZA VACCINE  02/07/2019  . TETANUS/TDAP  06/24/2021  . Hepatitis C Screening  Completed     Discussed health benefits of physical activity, and encouraged him to engage in regular exercise appropriate for his age and condition.    --------------------------------------------------------------------

## 2019-05-04 ENCOUNTER — Other Ambulatory Visit: Payer: Self-pay | Admitting: Physician Assistant

## 2019-05-04 DIAGNOSIS — I10 Essential (primary) hypertension: Secondary | ICD-10-CM

## 2019-05-04 NOTE — Telephone Encounter (Signed)
Shawn Sherman's patient - [facility] is requesting refills on Amlodipine 10 mg. #90

## 2019-05-05 MED ORDER — AMLODIPINE BESYLATE 10 MG PO TABS: 10.0000 mg | ORAL_TABLET | Freq: Every day | ORAL | 0 refills | Status: DC

## 2019-05-05 NOTE — Telephone Encounter (Signed)
LM for patient to schedule appt before any future refills.

## 2019-06-01 ENCOUNTER — Other Ambulatory Visit (INDEPENDENT_AMBULATORY_CARE_PROVIDER_SITE_OTHER): Payer: Self-pay | Admitting: Nurse Practitioner

## 2019-06-02 ENCOUNTER — Encounter
Admission: RE | Admit: 2019-06-02 | Discharge: 2019-06-02 | Disposition: A | Discharge status: Home / Self Care | Payer: 59 | Source: Ambulatory Visit | Attending: Vascular Surgery | Admitting: Vascular Surgery

## 2019-06-02 ENCOUNTER — Other Ambulatory Visit: Payer: Self-pay

## 2019-06-02 DIAGNOSIS — Z01818 Encounter for other preprocedural examination: Secondary | ICD-10-CM | POA: Diagnosis present

## 2019-06-02 LAB — BASIC METABOLIC PANEL
Anion gap: 10 (ref 5–15)
BUN: 15 mg/dL (ref 8–23)
CO2: 26 mmol/L (ref 22–32)
Calcium: 8.7 mg/dL — ABNORMAL LOW (ref 8.9–10.3)
Chloride: 104 mmol/L (ref 98–111)
Creatinine, Ser: 0.82 mg/dL (ref 0.61–1.24)
GFR calc Af Amer: >60 mL/min (ref >60)
GFR calc non Af Amer: >60 mL/min (ref >60)
Glucose, Bld: 143 mg/dL — ABNORMAL HIGH (ref 70–99)
Potassium: 3.2 mmol/L — ABNORMAL LOW (ref 3.5–5.1)
Sodium: 140 mmol/L (ref 135–145)

## 2019-06-02 LAB — CBC WITH DIFFERENTIAL/PLATELET
Abs Immature Granulocytes: 0.02 10*3/uL (ref 0.00–0.07)
Basophils Absolute: 0 10*3/uL (ref 0.0–0.1)
Basophils Relative: 1 %
Eosinophils Absolute: 0.2 10*3/uL (ref 0.0–0.5)
Eosinophils Relative: 3 %
HCT: 43.1 % (ref 39.0–52.0)
Hemoglobin: 14.3 g/dL (ref 13.0–17.0)
Immature Granulocytes: 0 %
Lymphocytes Relative: 16 %
Lymphs Abs: 1.1 10*3/uL (ref 0.7–4.0)
MCH: 28 pg (ref 26.0–34.0)
MCHC: 33.2 g/dL (ref 30.0–36.0)
MCV: 84.3 fL (ref 80.0–100.0)
Monocytes Absolute: 0.6 10*3/uL (ref 0.1–1.0)
Monocytes Relative: 9 %
Neutro Abs: 4.9 10*3/uL (ref 1.7–7.7)
Neutrophils Relative %: 71 %
Platelets: 170 10*3/uL (ref 150–400)
RBC: 5.11 MIL/uL (ref 4.22–5.81)
RDW: 13.3 % (ref 11.5–15.5)
WBC: 6.8 10*3/uL (ref 4.0–10.5)
nRBC: 0 % (ref 0.0–0.2)

## 2019-06-02 LAB — SURGICAL PCR SCREEN
MRSA, PCR: NEGATIVE
Staphylococcus aureus: NEGATIVE

## 2019-06-02 LAB — TYPE AND SCREEN
ABO/RH(D): A NEG
Antibody Screen: NEGATIVE

## 2019-06-02 LAB — PROTIME-INR
INR: 1 (ref 0.8–1.2)
Prothrombin Time: 13.3 seconds (ref 11.4–15.2)

## 2019-06-02 LAB — APTT: aPTT: 27 seconds (ref 24–36)

## 2019-06-02 NOTE — Patient Instructions (Signed)
Your procedure is scheduled on: Thurs 12/3 Report to Day Surgery To find out your arrival time please call [phone] between 1PM - 3PM on Wed 12/2.  Remember: Instructions that are not followed completely may result in serious medical risk,  up to and including death, or upon the discretion of your surgeon and anesthesiologist your  surgery may need to be rescheduled.     _X__ 1. Do not eat food after midnight the night before your procedure.                 No gum chewing or hard candies. You may drink clear liquids up to 2 hours                 before you are scheduled to arrive for your surgery- DO not drink clear                 liquids within 2 hours of the start of your surgery.                 Clear Liquids include:  water, apple juice without pulp, clear carbohydrate                 drink such as Clearfast of Gatorade, Black Coffee or Tea (Do not add                 anything to coffee or tea).  __X__2.  On the morning of surgery brush your teeth with toothpaste and water, you                may rinse your mouth with mouthwash if you wish.  Do not swallow any toothpaste of mouthwash.     ___ 3.  No Alcohol for 24 hours before or after surgery.   _X__ 4.                  Do not use any chewable tobacco products for at least 6 hours prior to                 surgery.  ____  5.  Bring all medications with you on the day of surgery if instructed.   __x__  6.  Notify your doctor if there is any change in your medical condition      (cold, fever, infections).     Do not wear jewelry, make-up, hairpins, clips or nail polish. Do not wear lotions, powders, or perfumes. You may wear deodorant. Do not shave 48 hours prior to surgery. Men may shave face and neck. Do not bring valuables to the hospital.    [facility] is not responsible for any belongings or valuables.  Contacts, dentures or bridgework may not be worn into surgery. Leave your suitcase in the  car. After surgery it may be brought to your room. For patients admitted to the hospital, discharge time is determined by your treatment team.   Patients discharged the day of surgery will not be allowed to drive home.   Please read over the following fact sheets that you were given:    ___x_ Take these medicines the morning of surgery with A SIP OF WATER:    1. amLODipine (NORVASC) 10 MG tablet  2. esomeprazole (NEXIUM) 40 MG capsule  3.   4.  5.  6.  ____ Fleet Enema (as directed)   ___x_ Use CHG Soap as directed  ____ Use inhalers on the day of surgery  ____ Stop metformin  2 days prior to surgery    ____ Take 1/2 of usual insulin dose the night before surgery. No insulin the morning          of surgery.   __x__ Stop Plavix today continue aspirin.  DO NOT TAKE THE MORNING OF SURGERY   __X__ Stop Anti-inflammatories Ibuprofen or Aleve or Excedrin  May take tylenol   __x__ Stop supplements until after surgery.  No extra Vit C.  ____ Bring C-Pap to the hospital.

## 2019-06-08 ENCOUNTER — Other Ambulatory Visit: Payer: Self-pay

## 2019-06-08 ENCOUNTER — Other Ambulatory Visit
Admission: RE | Admit: 2019-06-08 | Discharge: 2019-06-08 | Disposition: A | Discharge status: Home / Self Care | Payer: 59 | Source: Ambulatory Visit | Attending: Vascular Surgery | Admitting: Vascular Surgery

## 2019-06-08 LAB — SARS CORONAVIRUS 2 (TAT 6-24 HRS): SARS Coronavirus 2: NEGATIVE

## 2019-06-11 ENCOUNTER — Inpatient Hospital Stay
Admission: RE | Admit: 2019-06-11 | Discharge: 2019-06-12 | DRG: 039 | Disposition: A | Discharge status: Home / Self Care | Payer: 59 | Attending: Vascular Surgery | Admitting: Vascular Surgery

## 2019-06-11 ENCOUNTER — Inpatient Hospital Stay: Payer: 59 | Admitting: Certified Registered Nurse Anesthetist

## 2019-06-11 ENCOUNTER — Encounter
Admission: RE | Disposition: A | Discharge status: Home / Self Care | Payer: Self-pay | Source: Home / Self Care | Attending: Vascular Surgery

## 2019-06-11 ENCOUNTER — Other Ambulatory Visit: Payer: Self-pay

## 2019-06-11 ENCOUNTER — Encounter: Payer: Self-pay | Admitting: *Deleted

## 2019-06-11 DIAGNOSIS — R0989 Other specified symptoms and signs involving the circulatory and respiratory systems: Secondary | ICD-10-CM | POA: Diagnosis present

## 2019-06-11 DIAGNOSIS — I6522 Occlusion and stenosis of left carotid artery: Secondary | ICD-10-CM | POA: Diagnosis present

## 2019-06-11 DIAGNOSIS — Z79899 Other long term (current) drug therapy: Secondary | ICD-10-CM | POA: Diagnosis not present

## 2019-06-11 DIAGNOSIS — F17220 Nicotine dependence, chewing tobacco, uncomplicated: Secondary | ICD-10-CM | POA: Diagnosis present

## 2019-06-11 DIAGNOSIS — Z87442 Personal history of urinary calculi: Secondary | ICD-10-CM

## 2019-06-11 DIAGNOSIS — M1990 Unspecified osteoarthritis, unspecified site: Secondary | ICD-10-CM | POA: Diagnosis present

## 2019-06-11 DIAGNOSIS — K219 Gastro-esophageal reflux disease without esophagitis: Secondary | ICD-10-CM | POA: Diagnosis present

## 2019-06-11 DIAGNOSIS — E785 Hyperlipidemia, unspecified: Secondary | ICD-10-CM | POA: Diagnosis present

## 2019-06-11 DIAGNOSIS — I10 Essential (primary) hypertension: Secondary | ICD-10-CM | POA: Diagnosis present

## 2019-06-11 DIAGNOSIS — Z20828 Contact with and (suspected) exposure to other viral communicable diseases: Secondary | ICD-10-CM | POA: Diagnosis present

## 2019-06-11 DIAGNOSIS — I6523 Occlusion and stenosis of bilateral carotid arteries: Secondary | ICD-10-CM | POA: Diagnosis not present

## 2019-06-11 HISTORY — PX: ENDARTERECTOMY: SHX5162

## 2019-06-11 LAB — POCT I-STAT, CHEM 8
BUN: 12 mg/dL (ref 8–23)
Calcium, Ion: 1.19 mmol/L (ref 1.15–1.40)
Chloride: 108 mmol/L (ref 98–111)
Creatinine, Ser: 0.9 mg/dL (ref 0.61–1.24)
Glucose, Bld: 122 mg/dL — ABNORMAL HIGH (ref 70–99)
HCT: 46 % (ref 39.0–52.0)
Hemoglobin: 15.6 g/dL (ref 13.0–17.0)
Potassium: 4.3 mmol/L (ref 3.5–5.1)
Sodium: 140 mmol/L (ref 135–145)
TCO2: 23 mmol/L (ref 22–32)

## 2019-06-11 SURGERY — ENDARTERECTOMY, CAROTID
Anesthesia: General | Laterality: Left

## 2019-06-11 MED ORDER — MIDAZOLAM HCL 2 MG/2ML IJ SOLN
INTRAMUSCULAR | Status: AC
  Filled 2019-06-11: qty 2

## 2019-06-11 MED ORDER — PROMETHAZINE HCL 25 MG/ML IJ SOLN: 6.2500 mg | INTRAMUSCULAR | Status: DC | PRN

## 2019-06-11 MED ORDER — ACETAMINOPHEN 325 MG PO TABS: 325.0000 mg | ORAL_TABLET | ORAL | Status: DC | PRN

## 2019-06-11 MED ORDER — ALUM & MAG HYDROXIDE-SIMETH 200-200-20 MG/5ML PO SUSP: 15.0000 mL | ORAL | Status: DC | PRN

## 2019-06-11 MED ORDER — CHLORHEXIDINE GLUCONATE CLOTH 2 % EX PADS: 6.0000 | MEDICATED_PAD | Freq: Every day | CUTANEOUS | Status: DC

## 2019-06-11 MED ORDER — DOCUSATE SODIUM 100 MG PO CAPS
100.0000 mg | ORAL_CAPSULE | Freq: Every day | ORAL | Status: DC
  Filled 2019-06-11: qty 1

## 2019-06-11 MED ORDER — LIDOCAINE HCL (PF) 2 % IJ SOLN
INTRAMUSCULAR | Status: AC
  Filled 2019-06-11: qty 10

## 2019-06-11 MED ORDER — LABETALOL HCL 5 MG/ML IV SOLN
INTRAVENOUS | Status: DC | PRN
  Administered 2019-06-11 (×4): 5 mg via INTRAVENOUS

## 2019-06-11 MED ORDER — SODIUM CHLORIDE 0.9 % IV SOLN: 500.0000 mL | Freq: Once | INTRAVENOUS | Status: DC | PRN

## 2019-06-11 MED ORDER — ONDANSETRON HCL 4 MG/2ML IJ SOLN: 4.0000 mg | Freq: Four times a day (QID) | INTRAMUSCULAR | Status: DC | PRN

## 2019-06-11 MED ORDER — FIBRIN SEALANT 2 ML SINGLE DOSE KIT
PACK | CUTANEOUS | Status: DC | PRN
  Administered 2019-06-11: 2 mL via TOPICAL

## 2019-06-11 MED ORDER — LACTATED RINGERS IV SOLN
INTRAVENOUS | Status: DC | PRN
  Administered 2019-06-11 (×2): via INTRAVENOUS

## 2019-06-11 MED ORDER — FENTANYL CITRATE (PF) 100 MCG/2ML IJ SOLN
25.0000 ug | INTRAMUSCULAR | Status: DC | PRN
  Administered 2019-06-11 (×2): 25 ug via INTRAVENOUS

## 2019-06-11 MED ORDER — HYDRALAZINE HCL 20 MG/ML IJ SOLN: 5.0000 mg | INTRAMUSCULAR | Status: DC | PRN

## 2019-06-11 MED ORDER — CYANOCOBALAMIN 500 MCG PO TABS
500.0000 ug | ORAL_TABLET | Freq: Every day | ORAL | Status: DC
  Administered 2019-06-12: 500 ug via ORAL
  Filled 2019-06-11 (×2): qty 1

## 2019-06-11 MED ORDER — CHLORHEXIDINE GLUCONATE CLOTH 2 % EX PADS: 6.0000 | MEDICATED_PAD | Freq: Once | CUTANEOUS | Status: DC

## 2019-06-11 MED ORDER — MORPHINE SULFATE (PF) 2 MG/ML IV SOLN: 2.0000 mg | INTRAVENOUS | Status: DC | PRN

## 2019-06-11 MED ORDER — SODIUM CHLORIDE 0.9 % IV SOLN
INTRAVENOUS | Status: DC
  Administered 2019-06-11: 17:00:00 via INTRAVENOUS

## 2019-06-11 MED ORDER — ROCURONIUM BROMIDE 100 MG/10ML IV SOLN
INTRAVENOUS | Status: DC | PRN
  Administered 2019-06-11: 5 mg via INTRAVENOUS
  Administered 2019-06-11: 15 mg via INTRAVENOUS
  Administered 2019-06-11: 10 mg via INTRAVENOUS
  Administered 2019-06-11: 20 mg via INTRAVENOUS
  Administered 2019-06-11: 5 mg via INTRAVENOUS
  Administered 2019-06-11: 20 mg via INTRAVENOUS

## 2019-06-11 MED ORDER — SUGAMMADEX SODIUM 200 MG/2ML IV SOLN
INTRAVENOUS | Status: DC | PRN
  Administered 2019-06-11: 200 mg via INTRAVENOUS

## 2019-06-11 MED ORDER — METOPROLOL TARTRATE 5 MG/5ML IV SOLN: 2.0000 mg | INTRAVENOUS | Status: DC | PRN

## 2019-06-11 MED ORDER — HYDROCODONE-ACETAMINOPHEN 7.5-325 MG PO TABS
1.0000 | ORAL_TABLET | Freq: Once | ORAL | Status: DC | PRN
  Filled 2019-06-11: qty 1

## 2019-06-11 MED ORDER — MIDAZOLAM HCL 2 MG/2ML IJ SOLN
INTRAMUSCULAR | Status: DC | PRN
  Administered 2019-06-11: 2 mg via INTRAVENOUS

## 2019-06-11 MED ORDER — ROCURONIUM BROMIDE 50 MG/5ML IV SOLN
INTRAVENOUS | Status: AC
  Filled 2019-06-11: qty 1

## 2019-06-11 MED ORDER — LACTATED RINGERS IV SOLN
INTRAVENOUS | Status: DC | PRN
  Administered 2019-06-11: 13:00:00 via INTRAVENOUS

## 2019-06-11 MED ORDER — ASPIRIN 81 MG PO CHEW
81.0000 mg | CHEWABLE_TABLET | Freq: Every day | ORAL | Status: DC
  Administered 2019-06-11 – 2019-06-12 (×2): 81 mg via ORAL
  Filled 2019-06-11 (×2): qty 1

## 2019-06-11 MED ORDER — POTASSIUM CHLORIDE CRYS ER 20 MEQ PO TBCR: 20.0000 meq | EXTENDED_RELEASE_TABLET | Freq: Every day | ORAL | Status: DC | PRN

## 2019-06-11 MED ORDER — ATORVASTATIN CALCIUM 20 MG PO TABS
80.0000 mg | ORAL_TABLET | Freq: Every day | ORAL | Status: DC
  Administered 2019-06-11 – 2019-06-12 (×2): 80 mg via ORAL
  Filled 2019-06-11 (×2): qty 4

## 2019-06-11 MED ORDER — DEXMEDETOMIDINE HCL 200 MCG/2ML IV SOLN
INTRAVENOUS | Status: DC | PRN
  Administered 2019-06-11 (×3): 4 ug via INTRAVENOUS

## 2019-06-11 MED ORDER — HYDRALAZINE HCL 20 MG/ML IJ SOLN: 10.0000 mg | Freq: Once | INTRAMUSCULAR | Status: DC | PRN

## 2019-06-11 MED ORDER — ONDANSETRON HCL 4 MG/2ML IJ SOLN
INTRAMUSCULAR | Status: DC | PRN
  Administered 2019-06-11: 4 mg via INTRAVENOUS

## 2019-06-11 MED ORDER — FAMOTIDINE IN NACL 20-0.9 MG/50ML-% IV SOLN
20.0000 mg | Freq: Two times a day (BID) | INTRAVENOUS | Status: DC
  Administered 2019-06-11 – 2019-06-12 (×2): 20 mg via INTRAVENOUS
  Filled 2019-06-11 (×2): qty 50

## 2019-06-11 MED ORDER — CEFAZOLIN SODIUM-DEXTROSE 2-4 GM/100ML-% IV SOLN
2.0000 g | Freq: Three times a day (TID) | INTRAVENOUS | Status: AC
  Administered 2019-06-11 – 2019-06-12 (×2): 2 g via INTRAVENOUS
  Filled 2019-06-11 (×3): qty 100

## 2019-06-11 MED ORDER — FENTANYL CITRATE (PF) 100 MCG/2ML IJ SOLN
INTRAMUSCULAR | Status: DC | PRN
  Administered 2019-06-11: 50 ug via INTRAVENOUS
  Administered 2019-06-11 (×2): 25 ug via INTRAVENOUS

## 2019-06-11 MED ORDER — PROPOFOL 10 MG/ML IV BOLUS
INTRAVENOUS | Status: AC
  Filled 2019-06-11: qty 20

## 2019-06-11 MED ORDER — SUCCINYLCHOLINE CHLORIDE 20 MG/ML IJ SOLN
INTRAMUSCULAR | Status: AC
  Filled 2019-06-11: qty 1

## 2019-06-11 MED ORDER — LABETALOL HCL 5 MG/ML IV SOLN: 10.0000 mg | INTRAVENOUS | Status: DC | PRN

## 2019-06-11 MED ORDER — LIDOCAINE HCL (CARDIAC) PF 100 MG/5ML IV SOSY
PREFILLED_SYRINGE | INTRAVENOUS | Status: DC | PRN
  Administered 2019-06-11: 80 mg via INTRAVENOUS

## 2019-06-11 MED ORDER — FENTANYL CITRATE (PF) 100 MCG/2ML IJ SOLN
INTRAMUSCULAR | Status: AC
  Filled 2019-06-11: qty 2

## 2019-06-11 MED ORDER — DEXAMETHASONE SODIUM PHOSPHATE 10 MG/ML IJ SOLN
INTRAMUSCULAR | Status: AC
  Filled 2019-06-11: qty 1

## 2019-06-11 MED ORDER — FENTANYL CITRATE (PF) 100 MCG/2ML IJ SOLN
INTRAMUSCULAR | Status: AC
  Administered 2019-06-11: 25 ug via INTRAVENOUS
  Filled 2019-06-11: qty 2

## 2019-06-11 MED ORDER — LIDOCAINE HCL (PF) 1 % IJ SOLN
INTRAMUSCULAR | Status: AC
  Filled 2019-06-11: qty 30

## 2019-06-11 MED ORDER — AMLODIPINE BESYLATE 10 MG PO TABS
10.0000 mg | ORAL_TABLET | Freq: Every day | ORAL | Status: DC
  Administered 2019-06-11 – 2019-06-12 (×2): 10 mg via ORAL
  Filled 2019-06-11 (×2): qty 1

## 2019-06-11 MED ORDER — SODIUM CHLORIDE 0.9 % IV SOLN
INTRAVENOUS | Status: DC | PRN
  Administered 2019-06-11: 50 mL via INTRAMUSCULAR

## 2019-06-11 MED ORDER — ESMOLOL HCL-SODIUM CHLORIDE 2000 MG/100ML IV SOLN
25.0000 ug/kg/min | INTRAVENOUS | Status: DC
  Filled 2019-06-11: qty 100

## 2019-06-11 MED ORDER — GUAIFENESIN-DM 100-10 MG/5ML PO SYRP: 15.0000 mL | ORAL_SOLUTION | ORAL | Status: DC | PRN

## 2019-06-11 MED ORDER — SODIUM CHLORIDE 0.9 % IV SOLN
INTRAVENOUS | Status: DC | PRN
  Administered 2019-06-11: 30 ug/min via INTRAVENOUS

## 2019-06-11 MED ORDER — MAGNESIUM SULFATE 2 GM/50ML IV SOLN: 2.0000 g | Freq: Every day | INTRAVENOUS | Status: DC | PRN

## 2019-06-11 MED ORDER — HYDROMORPHONE HCL 1 MG/ML IJ SOLN: 1.0000 mg | Freq: Once | INTRAMUSCULAR | Status: DC | PRN

## 2019-06-11 MED ORDER — ACETAMINOPHEN 160 MG/5ML PO SOLN
325.0000 mg | ORAL | Status: DC | PRN
  Filled 2019-06-11: qty 20.3

## 2019-06-11 MED ORDER — PHENOL 1.4 % MT LIQD
1.0000 | OROMUCOSAL | Status: DC | PRN
  Filled 2019-06-11: qty 177

## 2019-06-11 MED ORDER — OXYCODONE-ACETAMINOPHEN 5-325 MG PO TABS: 1.0000 | ORAL_TABLET | Freq: Four times a day (QID) | ORAL | Status: DC | PRN

## 2019-06-11 MED ORDER — ACETAMINOPHEN 650 MG RE SUPP: 325.0000 mg | RECTAL | Status: DC | PRN

## 2019-06-11 MED ORDER — DEXAMETHASONE SODIUM PHOSPHATE 10 MG/ML IJ SOLN
INTRAMUSCULAR | Status: DC | PRN
  Administered 2019-06-11: 10 mg via INTRAVENOUS

## 2019-06-11 MED ORDER — SUCCINYLCHOLINE CHLORIDE 20 MG/ML IJ SOLN
INTRAMUSCULAR | Status: DC | PRN
  Administered 2019-06-11: 120 mg via INTRAVENOUS

## 2019-06-11 MED ORDER — HEPARIN SODIUM (PORCINE) 10000 UNIT/ML IJ SOLN
INTRAMUSCULAR | Status: AC
  Filled 2019-06-11: qty 1

## 2019-06-11 MED ORDER — CEFAZOLIN SODIUM-DEXTROSE 2-4 GM/100ML-% IV SOLN: 2.0000 g | INTRAVENOUS | Status: DC

## 2019-06-11 MED ORDER — PROPOFOL 10 MG/ML IV BOLUS
INTRAVENOUS | Status: DC | PRN
  Administered 2019-06-11: 120 mg via INTRAVENOUS

## 2019-06-11 MED ORDER — LACTATED RINGERS IV SOLN
INTRAVENOUS | Status: DC
  Administered 2019-06-11: 11:00:00 via INTRAVENOUS

## 2019-06-11 MED ORDER — LIDOCAINE HCL 1 % IJ SOLN
INTRAMUSCULAR | Status: DC | PRN
  Administered 2019-06-11: 10 mL

## 2019-06-11 MED ORDER — PHENYLEPHRINE HCL (PRESSORS) 10 MG/ML IV SOLN
INTRAVENOUS | Status: DC | PRN
  Administered 2019-06-11 (×3): 100 ug via INTRAVENOUS

## 2019-06-11 MED ORDER — GLYCOPYRROLATE 0.2 MG/ML IJ SOLN
INTRAMUSCULAR | Status: DC | PRN
  Administered 2019-06-11: .1 mg via INTRAVENOUS

## 2019-06-11 MED ORDER — PANTOPRAZOLE SODIUM 40 MG PO TBEC
40.0000 mg | DELAYED_RELEASE_TABLET | Freq: Every day | ORAL | Status: DC
  Administered 2019-06-12: 40 mg via ORAL
  Filled 2019-06-11: qty 1

## 2019-06-11 MED ORDER — MICROFIBRILLAR COLL HEMOSTAT EX PADS
MEDICATED_PAD | CUTANEOUS | Status: DC | PRN
  Administered 2019-06-11: 1 via TOPICAL

## 2019-06-11 MED ORDER — CLOPIDOGREL BISULFATE 75 MG PO TABS
75.0000 mg | ORAL_TABLET | Freq: Every day | ORAL | Status: DC
  Administered 2019-06-12: 75 mg via ORAL
  Filled 2019-06-11: qty 1

## 2019-06-11 MED ORDER — NAPROXEN SODIUM 220 MG PO TABS: 440.0000 mg | ORAL_TABLET | Freq: Two times a day (BID) | ORAL | Status: DC | PRN

## 2019-06-11 MED ORDER — MEPERIDINE HCL 50 MG/ML IJ SOLN: 6.2500 mg | INTRAMUSCULAR | Status: DC | PRN

## 2019-06-11 MED ORDER — CEFAZOLIN SODIUM-DEXTROSE 2-4 GM/100ML-% IV SOLN
INTRAVENOUS | Status: AC
  Filled 2019-06-11: qty 100

## 2019-06-11 MED ORDER — NITROGLYCERIN IN D5W 200-5 MCG/ML-% IV SOLN: 5.0000 ug/min | INTRAVENOUS | Status: DC

## 2019-06-11 MED ORDER — HEPARIN SODIUM (PORCINE) 1000 UNIT/ML IJ SOLN
INTRAMUSCULAR | Status: DC | PRN
  Administered 2019-06-11: 6000 [IU] via INTRAVENOUS

## 2019-06-11 MED ORDER — ONDANSETRON HCL 4 MG/2ML IJ SOLN
INTRAMUSCULAR | Status: AC
  Filled 2019-06-11: qty 2

## 2019-06-11 SURGICAL SUPPLY — 58 items
BAG DECANTER FOR FLEXI CONT (MISCELLANEOUS) ×2 IMPLANT
BLADE SURG 15 STRL LF DISP TIS (BLADE) ×1 IMPLANT
BLADE SURG 15 STRL SS (BLADE) ×2
BLADE SURG SZ11 CARB STEEL (BLADE) ×2 IMPLANT
BOOT SUTURE AID YELLOW STND (SUTURE) ×2 IMPLANT
BRUSH SCRUB EZ  4% CHG (MISCELLANEOUS) ×1
BRUSH SCRUB EZ 4% CHG (MISCELLANEOUS) ×1 IMPLANT
CANISTER SUCT 1200ML W/VALVE (MISCELLANEOUS) ×2 IMPLANT
CHLORAPREP W/TINT 26ML (MISCELLANEOUS) ×2 IMPLANT
COVER WAND RF STERILE (DRAPES) ×2 IMPLANT
DERMABOND ADVANCED (GAUZE/BANDAGES/DRESSINGS) ×1
DERMABOND ADVANCED .7 DNX12 (GAUZE/BANDAGES/DRESSINGS) ×1 IMPLANT
DRAPE 3/4 80X56 (DRAPES) ×2 IMPLANT
DRAPE INCISE IOBAN 66X45 STRL (DRAPES) ×2 IMPLANT
DRAPE LAPAROTOMY 77X122 PED (DRAPES) ×2 IMPLANT
ELECT CAUTERY BLADE 6.4 (BLADE) ×2 IMPLANT
ELECT REM PT RETURN 9FT ADLT (ELECTROSURGICAL) ×2
ELECTRODE REM PT RTRN 9FT ADLT (ELECTROSURGICAL) ×1 IMPLANT
GLOVE BIO SURGEON STRL SZ7 (GLOVE) ×6 IMPLANT
GLOVE INDICATOR 7.5 STRL GRN (GLOVE) ×6 IMPLANT
GOWN STRL REUS W/ TWL LRG LVL3 (GOWN DISPOSABLE) ×2 IMPLANT
GOWN STRL REUS W/ TWL XL LVL3 (GOWN DISPOSABLE) ×2 IMPLANT
GOWN STRL REUS W/TWL LRG LVL3 (GOWN DISPOSABLE) ×4
GOWN STRL REUS W/TWL XL LVL3 (GOWN DISPOSABLE) ×4
HEMOSTAT SURGICEL 2X3 (HEMOSTASIS) ×2 IMPLANT
IV NS 250ML (IV SOLUTION) ×1
IV NS 250ML BAXH (IV SOLUTION) ×1 IMPLANT
KIT TURNOVER KIT A (KITS) ×2 IMPLANT
LABEL OR SOLS (LABEL) ×2 IMPLANT
LOOP RED MAXI  1X406MM (MISCELLANEOUS) ×2
LOOP VESSEL MAXI 1X406 RED (MISCELLANEOUS) ×2 IMPLANT
LOOP VESSEL MINI 0.8X406 BLUE (MISCELLANEOUS) ×1 IMPLANT
LOOPS BLUE MINI 0.8X406MM (MISCELLANEOUS) ×1
NEEDLE FILTER BLUNT 18X 1/2SAF (NEEDLE) ×1
NEEDLE FILTER BLUNT 18X1 1/2 (NEEDLE) ×1 IMPLANT
NEEDLE HYPO 25X1 1.5 SAFETY (NEEDLE) ×2 IMPLANT
NS IRRIG 500ML POUR BTL (IV SOLUTION) ×2 IMPLANT
PACK BASIN MAJOR ARMC (MISCELLANEOUS) ×2 IMPLANT
PATCH CAROTID ECM VASC 1X10 (Prosthesis & Implant Heart) ×2 IMPLANT
PENCIL ELECTRO HAND CTR (MISCELLANEOUS) IMPLANT
SHUNT W TPORT 9FR PRUITT F3 (SHUNT) ×2 IMPLANT
SUT MNCRL 4-0 (SUTURE) ×1
SUT MNCRL 4-0 27XMFL (SUTURE) ×1
SUT PROLENE 6 0 BV (SUTURE) ×8 IMPLANT
SUT PROLENE 7 0 BV 1 (SUTURE) ×8 IMPLANT
SUT SILK 2 0 (SUTURE) ×2
SUT SILK 2-0 18XBRD TIE 12 (SUTURE) ×1 IMPLANT
SUT SILK 3 0 (SUTURE) ×1
SUT SILK 3-0 18XBRD TIE 12 (SUTURE) ×1 IMPLANT
SUT SILK 4 0 (SUTURE) ×1
SUT SILK 4-0 18XBRD TIE 12 (SUTURE) ×1 IMPLANT
SUT VIC AB 3-0 SH 27 (SUTURE) ×2
SUT VIC AB 3-0 SH 27X BRD (SUTURE) ×2 IMPLANT
SUTURE MNCRL 4-0 27XMF (SUTURE) ×1 IMPLANT
SYR 10ML LL (SYRINGE) ×4 IMPLANT
SYR 20ML LL LF (SYRINGE) ×2 IMPLANT
TRAY FOLEY MTR SLVR 16FR STAT (SET/KITS/TRAYS/PACK) ×2 IMPLANT
TUBING CONNECTING 10 (TUBING) IMPLANT

## 2019-06-11 NOTE — Anesthesia Procedure Notes (Signed)
Procedure Name: Intubation Date/Time: 06/11/2019 12:29 PM Performed by: Lily Peer, Summer, RN Pre-anesthesia Checklist: Patient identified, Patient being monitored, Timeout performed, Emergency Drugs available and Suction available Patient Re-evaluated:Patient Re-evaluated prior to induction Oxygen Delivery Method: Circle system utilized Preoxygenation: Pre-oxygenation with 100% oxygen Induction Type: IV induction Ventilation: Mask ventilation without difficulty Laryngoscope Size: 3 and McGraph Grade View: Grade I Tube type: Oral Tube size: 7.5 mm Number of attempts: 1 Airway Equipment and Method: Stylet Placement Confirmation: ETT inserted through vocal cords under direct vision,  positive ETCO2 and breath sounds checked- equal and bilateral Secured at: 22 cm Tube secured with: Tape Dental Injury: Teeth and Oropharynx as per pre-operative assessment

## 2019-06-11 NOTE — Op Note (Signed)
[facility]   OPERATIVE NOTE  PROCEDURE:   1.  Left carotid endarterectomy with CorMatrix arterial patch reconstruction  PRE-OPERATIVE DIAGNOSIS: 1.  High grade left carotid stenosis 2. Previous right CEA  POST-OPERATIVE DIAGNOSIS: same as above   SURGEON: Festus Barren, MD  ASSISTANT(S): Raul Del, PA-C  ANESTHESIA: general  ESTIMATED BLOOD LOSS: 75 cc  FINDING(S): 1.  left carotid plaque.  SPECIMEN(S):  Carotid plaque (sent to Pathology)  INDICATIONS:   Shawn Sherman is a 63 y.o. male who presents with left carotid stenosis of >75%.  I discussed with the patient the risks, benefits, and alternatives to carotid endarterectomy.  I discussed the differences between carotid stenting and carotid endarterectomy. I discussed the procedural details of carotid endarterectomy with the patient.  The patient is aware that the risks of carotid endarterectomy include but are not limited to: bleeding, infection, stroke, myocardial infarction, death, cranial nerve injuries both temporary and permanent, neck hematoma, possible airway compromise, labile blood pressure post-operatively, cerebral hyperperfusion syndrome, and possible need for additional interventions in the future. The patient is aware of the risks and agrees to proceed forward with the procedure. An assistant was present during the procedure to help facilitate the exposure and expedite the procedure.  DESCRIPTION: After full informed written consent was obtained from the patient, the patient was brought back to the operating room and placed supine upon the operating table.  Prior to induction, the patient received IV antibiotics.  After obtaining adequate anesthesia, the patient was placed into a modified beach chair position with a shoulder roll in place and the patient's neck slightly hyperextended and rotated away from the surgical site.  The patient was prepped in the standard fashion for a carotid  endarterectomy. The assistant provided retraction and mobilization to help facilitate exposure and expedite the procedure throughout the entire procedure.  This included following suture, using retractors, and optimizing lighting. I made an incision anterior to the sternocleidomastoid muscle and dissected down through the subcutaneous tissue.  The platysmas was opened with electrocautery.  Then I dissected down to the internal jugular vein and facial vein.  The facial vein is ligated and divided between 2-0 silk ties.  This was dissected posteriorly until I obtained visualization of the common carotid artery.  This was dissected out and then a vessel loop was placed around the common carotid artery.  I then dissected in a periadventitial fashion along the common carotid artery up to the bifurcation.  I then identified the external carotid artery and the superior thyroid artery.  I placed a vessel loop around the superior thyroid artery, and I also dissected out the external carotid artery and placed a vessel loop around it. In the process of this dissection, the hypoglossal nerve was identified and protected from harm.  I then dissected out the internal carotid artery until I identified an area in the internal carotid artery clearly above the stenosis.  I dissected slightly distal to this area, and placed a vessel loop around the artery.  At this point, we gave the patient 6000 units of intravenous heparin.  After this was allowed to circulate for several minutes, I pulled up control on the vessel loops to clamp the internal carotid artery, external carotid artery, superior thyroid artery, and then the common carotid artery.  I then made an arteriotomy in the common carotid artery with a 11 blade, and extended the arteriotomy with a Potts scissor down into the common carotid artery, then I carried  the arteriotomy through the bifurcation into the internal carotid artery until I reached an area that was not diseased.   At this point, I took the [place] shunt that previously been prepared and I inserted it into the internal carotid artery first, and then into the common carotid artery taking care to flush and de-air prior to release of control. At this point, I started the endarterectomy in the common carotid artery with a [place] elevator and carried this dissection down into the common carotid artery circumferentially.  Then I transected the plaque at a segment where it was adherent and transected the plaque with Potts scissors.  I then carried this dissection up into the external carotid artery.  The plaque was extracted by unclamping the external carotid artery and performing an eversion endarterectomy.  The dissection was then carried into the internal carotid artery where a nice feathered end point was created with gentle traction.  I passed the plaque off the field as a specimen. At this point I removed all loose flecks and remaining disease possible.  At this point, I was satisfied that the minimal remaining disease was densely adherent to the wall and wall integrity was intact. The distal endpoint was tacked down with three 7-0 Prolene sutures.  I then fashioned a CorMatrix arterial patch for the artery and sewed it in place with two running stitch of 6-0 Prolene.  I started at the distal endpoint and ran one half the length of the arteriotomy.  I then cut and beveled the patch to an appropriate length to match the arteriotomy.  I started the second 6-0 Prolene at the proximal end point.  The medial suture line was completed and the lateral suture line was run approximately one quarter the length of the arteriotomy.  Prior to completing this patch angioplasty, I removed the shunt first from the internal carotid artery, from which there was excellent backbleeding, and clamped it.  Then I removed the shunt from the common carotid artery, from which there was excellent antegrade bleeding, and then clamped it.  At this  point, I allowed the external carotid artery to backbleed, which was excellent.  Then I instilled heparinized saline in this patched artery and then completed the patch angioplasty in the usual fashion.  First, I released the clamp on the external carotid artery, then I released it on the common carotid artery.  After waiting a few seconds, I then released it on the internal carotid artery. Several minutes of pressure were held and 6-0 Prolene patch sutures were used as need for hemostasis.  At this point, I placed Surgicel and Evicel topical hemostatic agents.  There was no more active bleeding in the surgical site.  The sternocleidomastoid space was closed with three interrupted 3-0 Vicryl sutures. I then reapproximated the platysma muscle with a running stitch of 3-0 Vicryl.  The skin was then closed with a running subcuticular 4-0 Monocryl.  The skin was then cleaned, dried and Dermabond was used to reinforce the skin closure.  The patient awakened and was taken to the recovery room in stable condition, following commands and moving all four extremities without any apparent deficits.    COMPLICATIONS: none  CONDITION: stable  Leotis Pain  06/11/2019, 2:16 PM    This note was created with Dragon Medical transcription system. Any errors in dictation are purely unintentional.

## 2019-06-11 NOTE — Transfer of Care (Signed)
Immediate Anesthesia Transfer of Care Note  Patient: Shawn Sherman  Procedure(s) Performed: ENDARTERECTOMY CAROTID (Left )  Patient Location: PACU  Anesthesia Type:General  Level of Consciousness: awake and patient cooperative  Airway & Oxygen Therapy: Patient Spontanous Breathing and Patient connected to face mask oxygen  Post-op Assessment: Report given to RN and Post -op Vital signs reviewed and stable  Post vital signs: Reviewed and stable  Last Vitals:  Vitals Value Taken Time  BP 135/101 06/11/19 1445  Temp    Pulse 76 06/11/19 1445  Resp 15 06/11/19 1445  SpO2 100 % 06/11/19 1445  Vitals shown include unvalidated device data.  Last Pain:  Vitals:   06/11/19 1441  TempSrc:   PainSc: (P) 0-No pain         Complications: No apparent anesthesia complications

## 2019-06-11 NOTE — H&P (Signed)
[facility] [place] [facility] Admission History & Physical  MRN : [id number]  Shawn Sherman is a 63 y.o. (Apr 02, 1956) male who presents with chief complaint of No chief complaint on file. Marland Kitchen  History of Present Illness: patient presents today with left carotid stenosis for left CEA.  Had right CEA 3-4 months ago.  Did well. No new complaints today.    Current Facility-Administered Medications  Medication Dose Route Frequency Provider Last Rate Last Dose  . ceFAZolin (ANCEF) 2-4 GM/100ML-% IVPB           . ceFAZolin (ANCEF) IVPB 2g/100 mL premix  2 g Intravenous On Call to Churchill, Brinson, NP      . Chlorhexidine Gluconate Cloth 2 % PADS 6 each  6 each Topical Once Kris Hartmann, NP       And  . Chlorhexidine Gluconate Cloth 2 % PADS 6 each  6 each Topical Once Eulogio Ditch E, NP      . HYDROmorphone (DILAUDID) injection 1 mg  1 mg Intravenous Once PRN Kris Hartmann, NP      . lactated ringers infusion   Intravenous Continuous [place], Amy, MD 50 mL/hr at 06/11/19 1115    . ondansetron (ZOFRAN) injection 4 mg  4 mg Intravenous Q6H PRN Kris Hartmann, NP        Past Medical History:  Diagnosis Date  . Anginal pain ([place]) 08/2018   a. 08/2018 - atyp c/p and neg trop; b. 08/2018 MV: EF 57%, inferolateral horizontal ST depression w/o ischemia or infarct.  . Arthritis   . Carotid arterial disease ([place])    a. 01/2019 Carotid U/S: RICA 80-90, RCCA <81, EXNT>70, LICA 01-74, BSWH>67, LECA>50; b. 01/2019 CT Head/Neck: RICA string sign in bulb - lumen <41mm. Sev prox RECA dzs. LICA 60 @ dist bulb. R vert 50-70, L vert 30-50.  . Carotid bruit present 08/2018   left  . Chewing tobacco nicotine dependence   . Essential hypertension 03/29/2017  . GERD (gastroesophageal reflux disease)   . History of kidney stones   . Hyperlipidemia     Past Surgical History:  Procedure Laterality Date  . ANTERIOR LAT LUMBAR FUSION Right 01/01/2013   Procedure: lumbar three-four extreme lateral  interbody fusion;  Surgeon: Faythe Ghee, MD;  Location: [facility];  Service: Neurosurgery;  Laterality: Right;  Lumbar three-four extreme lateral interbody fusion    . BACK SURGERY  08  . COLONOSCOPY    . ENDARTERECTOMY Right 02/18/2019   Procedure: ENDARTERECTOMY CAROTID;  Surgeon: Algernon Huxley, MD;  Location: [facility] ORS;  Service: Vascular;  Laterality: Right;  . ESOPHAGOGASTRODUODENOSCOPY (EGD) WITH PROPOFOL N/A 07/05/2015   Procedure: ESOPHAGOGASTRODUODENOSCOPY (EGD) WITH PROPOFOL;  Surgeon: Lucilla Lame, MD;  Location: [facility] ENDOSCOPY;  Service: Endoscopy;  Laterality: N/A;  . EXTRACORPOREAL SHOCK WAVE LITHOTRIPSY Right 09/11/2018   Procedure: EXTRACORPOREAL SHOCK WAVE LITHOTRIPSY (ESWL);  Surgeon: Abbie Sons, MD;  Location: [facility] ORS;  Service: Urology;  Laterality: Right;  . LUMBAR PERCUTANEOUS PEDICLE SCREW 1 LEVEL N/A 01/01/2013   Procedure: lumbar three-four percutaneous pedicle screws ;  Surgeon: Faythe Ghee, MD;  Location: [facility];  Service: Neurosurgery;  Laterality: N/A;  lumbar three-four percutaneous pedicle screws     Social History   Tobacco Use  . Smoking status: Never Smoker  . Smokeless tobacco: Current User    Types: Snuff  Substance Use Topics  . Alcohol use: No  . Drug use: No  Family History  Problem Relation Age of Onset  . Cancer Mother   . Cancer Father   . COPD Sister     No Known Allergies   REVIEW OF SYSTEMS (Negative unless checked)  Constitutional: [] Weight loss  [] Fever  [] Chills Cardiac: [x] Chest pain   [] Chest pressure   [] Palpitations   [] Shortness of breath when laying flat   [] Shortness of breath at rest   [x] Shortness of breath with exertion. Vascular:  [] Pain in legs with walking   [] Pain in legs at rest   [] Pain in legs when laying flat   [] Claudication   [] Pain in feet when walking  [] Pain in feet at rest  [] Pain in feet when laying flat   [] History of DVT   [] Phlebitis   [] Swelling in legs   [] Varicose veins    [] Non-healing ulcers Pulmonary:   [] Uses home oxygen   [] Productive cough   [] Hemoptysis   [] Wheeze  [] COPD   [] Asthma Neurologic:  [] Dizziness  [] Blackouts   [] Seizures   [] History of stroke   [] History of TIA  [] Aphasia   [] Temporary blindness   [] Dysphagia   [] Weakness or numbness in arms   [] Weakness or numbness in legs Musculoskeletal:  [x] Arthritis   [] Joint swelling   [] Joint pain   [] Low back pain Hematologic:  [] Easy bruising  [] Easy bleeding   [] Hypercoagulable state   [] Anemic  [] Hepatitis Gastrointestinal:  [] Blood in stool   [] Vomiting blood  [x] Gastroesophageal reflux/heartburn   [] Difficulty swallowing. Genitourinary:  [] Chronic kidney disease   [] Difficult urination  [] Frequent urination  [] Burning with urination   [] Blood in urine Skin:  [] Rashes   [] Ulcers   [] Wounds Psychological:  [] History of anxiety   []  History of major depression.  Physical Examination  Vitals:   06/11/19 1111  BP: (!) 155/96  Pulse: 75  Resp: 16  Temp: (!) 97.2 F (36.2 C)  TempSrc: Temporal  SpO2: 100%  Weight: 82 kg  Height: 5\' 10"  (1.778 m)   Body mass index is 25.94 kg/m. Gen: WD/WN, NAD Head: [place]/AT, No temporalis wasting.  Ear/Nose/Throat: Hearing grossly intact, nares w/o erythema or drainage, oropharynx w/o Erythema/Exudate,  Eyes: Conjunctiva clear, sclera non-icteric Neck: Trachea midline.  No JVD.  Pulmonary:  Good air movement, respirations not labored, no use of accessory muscles.  Cardiac: RRR, normal S1, S2. Vascular:  Vessel Right Left  Radial Palpable Palpable   Musculoskeletal: M/S 5/5 throughout.  Extremities without ischemic changes.  No deformity or atrophy.  Neurologic: Sensation grossly intact in extremities.  Symmetrical.  Speech is fluent. Motor exam as listed above. Psychiatric: Judgment intact, Mood & affect appropriate for pt's clinical situation. Dermatologic: No rashes or ulcers noted.  No cellulitis or open wounds.      CBC Lab Results  Component  Value Date   WBC 6.8 06/02/2019   HGB 15.6 06/11/2019   HCT 46.0 06/11/2019   MCV 84.3 06/02/2019   PLT 170 06/02/2019    BMET    Component Value Date/Time   NA 140 06/11/2019 1105   K 4.3 06/11/2019 1105   CL 108 06/11/2019 1105   CO2 26 06/02/2019 0848   GLUCOSE 122 (H) 06/11/2019 1105   BUN 12 06/11/2019 1105   CREATININE 0.90 06/11/2019 1105   CALCIUM 8.7 (L) 06/02/2019 0848   GFRNONAA >60 06/02/2019 0848   GFRAA >60 06/02/2019 0848   Estimated Creatinine Clearance: 86.7 mL/min (by C-G formula based on SCr of 0.9 mg/dL).  COAG Lab Results  Component Value Date  INR 1.0 06/02/2019   INR 1.0 02/13/2019    Radiology No results found.    Assessment/Plan 1. Left carotid stenosis.  For CEA today.  Did well with right CEA 3-4 months ago.  Risks and benefits are discussed. 2. Right carotid stenosis. S/p right CEA 3. Hypertension. Stable on outpatient medications and blood pressure control important in reducing the progression of atherosclerotic disease. On appropriate oral medications. 4. Hyperlipidemia. lipid control important in reducing the progression of atherosclerotic disease. Continue statin therapy    Festus BarrenJason Samera Macy, MD  06/11/2019 11:45 AM

## 2019-06-11 NOTE — Anesthesia Preprocedure Evaluation (Signed)
Anesthesia Evaluation  Patient identified by MRN, date of birth, ID band Patient awake    Reviewed: Allergy & Precautions, H&P , NPO status , reviewed documented beta blocker date and time   Airway Mallampati: II   Neck ROM: full    Dental  (+) Chipped   Pulmonary    Pulmonary exam normal        Cardiovascular hypertension, + angina Normal cardiovascular exam  Feb 2020  Probably normal myocardial perfusion stress test without significant ischemia or scar on perfusion imaging.  The left ventricular ejection fraction is normal (57%).  Horizontal ST segment depression ST segment depression of 2 mm was noted during stress in the II, III, aVF, V4, V5 and V6 leads.  This is a low risk study based on the perfusion imaging. However, significant ST depressions were noted during stress. Correlate clinically.   Neuro/Psych    GI/Hepatic GERD  Medicated and Controlled,  Endo/Other    Renal/GU Renal disease     Musculoskeletal  (+) Arthritis ,   Abdominal   Peds  Hematology   Anesthesia Other Findings Past Medical History: 08/2018: Anginal pain ([facility])     Comment:  a. 08/2018 - atyp c/p and neg trop; b. 08/2018 MV: EF 57%,              inferolateral horizontal ST depression w/o ischemia or               infarct. No date: Arthritis No date: Carotid arterial disease ([facility])     Comment:  a. 01/2019 Carotid U/S: RICA 80-90, RCCA <50, RECA>50,               LICA 80-99, LCCA>50, LECA>50; b. 01/2019 CT Head/Neck:               RICA string sign in bulb - lumen <21mm. Sev prox RECA dzs.              LICA 60 @ dist bulb. R vert 50-70, L vert 30-50. 08/2018: Carotid bruit present     Comment:  left No date: Chewing tobacco nicotine dependence 03/29/2017: Essential hypertension No date: GERD (gastroesophageal reflux disease) No date: History of kidney stones No date: Hyperlipidemia Past Surgical History: 01/01/2013: ANTERIOR LAT LUMBAR  FUSION; Right     Comment:  Procedure: lumbar three-four extreme lateral interbody               fusion;  Surgeon: Reinaldo Meeker, MD;  [facility]: [facility];  Service: Neurosurgery;  Laterality: Right;                Lumbar three-four extreme lateral interbody fusion   08: BACK SURGERY No date: COLONOSCOPY 02/18/2019: ENDARTERECTOMY; Right     Comment:  Procedure: ENDARTERECTOMY CAROTID;  Surgeon: Annice Needy, MD;  Location: [facility] ORS;  Service: Vascular;                Laterality: Right; 07/05/2015: ESOPHAGOGASTRODUODENOSCOPY (EGD) WITH PROPOFOL; N/A     Comment:  Procedure: ESOPHAGOGASTRODUODENOSCOPY (EGD) WITH               PROPOFOL;  Surgeon: Midge Minium, MD;  Location: [facility]               ENDOSCOPY;  Service: Endoscopy;  Laterality: N/A; 09/11/2018: EXTRACORPOREAL SHOCK WAVE LITHOTRIPSY; Right  Comment:  Procedure: EXTRACORPOREAL SHOCK WAVE LITHOTRIPSY (ESWL);              Surgeon: Abbie Sons, MD;  Location: [facility] ORS;                Service: Urology;  Laterality: Right; 01/01/2013: LUMBAR PERCUTANEOUS PEDICLE SCREW 1 LEVEL; N/A     Comment:  Procedure: lumbar three-four percutaneous pedicle screws              ;  Surgeon: Faythe Ghee, MD;  Location: [place] [facility];              Service: Neurosurgery;  Laterality: N/A;  lumbar               three-four percutaneous pedicle screws   Reproductive/Obstetrics                             Anesthesia Physical Anesthesia Plan  ASA: III  Anesthesia Plan: General   Post-op Pain Management:    Induction: Intravenous  PONV Risk Score and Plan: 2 and Dexamethasone, Midazolam and Treatment may vary due to age or medical condition  Airway Management Planned: Oral ETT  Additional Equipment:   Intra-op Plan:   Post-operative Plan: Extubation in OR  Informed Consent: I have reviewed the patients History and Physical, chart, labs and discussed the procedure including the  risks, benefits and alternatives for the proposed anesthesia with the patient or authorized representative who has indicated his/her understanding and acceptance.     Dental Advisory Given  Plan Discussed with: CRNA  Anesthesia Plan Comments:         Anesthesia Quick Evaluation

## 2019-06-11 NOTE — Anesthesia Post-op Follow-up Note (Signed)
Anesthesia QCDR form completed.        

## 2019-06-12 ENCOUNTER — Encounter: Payer: Self-pay | Admitting: Vascular Surgery

## 2019-06-12 DIAGNOSIS — I6523 Occlusion and stenosis of bilateral carotid arteries: Secondary | ICD-10-CM

## 2019-06-12 LAB — CBC
HCT: 37.5 % — ABNORMAL LOW (ref 39.0–52.0)
Hemoglobin: 12.7 g/dL — ABNORMAL LOW (ref 13.0–17.0)
MCH: 27.9 pg (ref 26.0–34.0)
MCHC: 33.9 g/dL (ref 30.0–36.0)
MCV: 82.2 fL (ref 80.0–100.0)
Platelets: 175 10*3/uL (ref 150–400)
RBC: 4.56 MIL/uL (ref 4.22–5.81)
RDW: 13.3 % (ref 11.5–15.5)
WBC: 14.4 10*3/uL — ABNORMAL HIGH (ref 4.0–10.5)
nRBC: 0 % (ref 0.0–0.2)

## 2019-06-12 LAB — BASIC METABOLIC PANEL
Anion gap: 6 (ref 5–15)
BUN: 17 mg/dL (ref 8–23)
CO2: 24 mmol/L (ref 22–32)
Calcium: 8.3 mg/dL — ABNORMAL LOW (ref 8.9–10.3)
Chloride: 105 mmol/L (ref 98–111)
Creatinine, Ser: 0.94 mg/dL (ref 0.61–1.24)
GFR calc Af Amer: >60 mL/min (ref >60)
GFR calc non Af Amer: >60 mL/min (ref >60)
Glucose, Bld: 203 mg/dL — ABNORMAL HIGH (ref 70–99)
Potassium: 4.4 mmol/L (ref 3.5–5.1)
Sodium: 135 mmol/L (ref 135–145)

## 2019-06-12 MED ORDER — OXYCODONE-ACETAMINOPHEN 5-325 MG PO TABS: 1.0000 | ORAL_TABLET | Freq: Four times a day (QID) | ORAL | 0 refills | Status: DC | PRN

## 2019-06-12 MED ORDER — CHLORHEXIDINE GLUCONATE CLOTH 2 % EX PADS: 6.0000 | MEDICATED_PAD | Freq: Every day | CUTANEOUS | Status: DC

## 2019-06-12 NOTE — Progress Notes (Signed)
Received call from [facility], caller wants to know if there are any D/C needs. Informed her that pt had surgery yesterday. She reports we can contact Danielle at [phone] (direct #) for any D/C needs.

## 2019-06-12 NOTE — Progress Notes (Signed)
Patient's urinary catheter removed and patient voided without difficulty.  Patient prepared for discharge.  IV's removed and discharge instructions given to patient.  All questions answered.  Awaiting for his wife to pick him up.

## 2019-06-12 NOTE — Discharge Instructions (Signed)
Vascular Surgery Discharge Instructions: You can shower as of Saturday. Keep incision clean and dry. No driving while on pain medication or until your first follow up in our office. No heavy lifting >10 pounds or strenuous activity until you follow up in our office.

## 2019-06-12 NOTE — Anesthesia Postprocedure Evaluation (Signed)
Anesthesia Post Note  Patient: Shawn Sherman  Procedure(s) Performed: ENDARTERECTOMY CAROTID (Left )  Patient location during evaluation: ICU Anesthesia Type: General Level of consciousness: awake, awake and alert and oriented Pain management: pain level controlled Vital Signs Assessment: post-procedure vital signs reviewed and stable Respiratory status: spontaneous breathing Cardiovascular status: stable Postop Assessment: no apparent nausea or vomiting Anesthetic complications: no     Last Vitals:  Vitals:   06/12/19 0400 06/12/19 0504  BP: (!) 104/59 111/67  Pulse: (!) 53 89  Resp: 14 (!) 25  Temp:    SpO2: 95% 98%    Last Pain:  Vitals:   06/12/19 0330  TempSrc: Oral  PainSc:                  Ricki Miller

## 2019-06-12 NOTE — Discharge Summary (Signed)
[facility]    Discharge Summary  Patient ID:  Shawn Sherman MRN: [id number] DOB/AGE: Jun 19, 1956 63 y.o.  Admit date: 06/11/2019 Discharge date: 06/12/2019 Date of Surgery: 06/11/2019 Surgeon: Surgeon(s): Wyn Quaker Marlow Baars, MD  Admission Diagnosis: CAROTID ARTERY STENOSIS  Discharge Diagnoses:  CAROTID ARTERY STENOSIS  Secondary Diagnoses: Past Medical History:  Diagnosis Date  . Anginal pain (HCC) 08/2018   a. 08/2018 - atyp c/p and neg trop; b. 08/2018 MV: EF 57%, inferolateral horizontal ST depression w/o ischemia or infarct.  . Arthritis   . Carotid arterial disease (HCC)    a. 01/2019 Carotid U/S: RICA 80-90, RCCA <50, RECA>50, LICA 80-99, LCCA>50, LECA>50; b. 01/2019 CT Head/Neck: RICA string sign in bulb - lumen <41mm. Sev prox RECA dzs. LICA 60 @ dist bulb. R vert 50-70, L vert 30-50.  . Carotid bruit present 08/2018   left  . Chewing tobacco nicotine dependence   . Essential hypertension 03/29/2017  . GERD (gastroesophageal reflux disease)   . History of kidney stones   . Hyperlipidemia    Procedure(s): ENDARTERECTOMY CAROTID (LEFT)  Discharged Condition: Good  HPI / Hospital Course:  BASSEM BERNASCONI is a 63 y.o. male presents with left carotid stenosis of >75%.  I discussed with the patient the risks, benefits, and alternatives to carotid endarterectomy.  I discussed the differences between carotid stenting and carotid endarterectomy. I discussed the procedural details of carotid endarterectomy with the patient.  The patient is aware that the risks of carotid endarterectomy include but are not limited to: bleeding, infection, stroke, myocardial infarction, death, cranial nerve injuries both temporary and permanent, neck hematoma, possible airway compromise, labile blood pressure post-operatively, cerebral hyperperfusion syndrome, and possible need for additional interventions in the future. The patient is aware of the risks and agrees to proceed forward  with the procedure.  On 06/11/19, the patient underwent:  1.  Left carotid endarterectomy with CorMatrix arterial patch reconstruction  He tolerated the procedure well was transferred from the operating room to the ICU for observation overnight without issue.  During the patient's brief inpatient stay, his diet was advanced, his Foley was removed his pain was controlled that he use of p.o. pain medication and he was ambulating independently.  On the day of discharge, the patient was afebrile with stable vital signs and a relatively unremarkable physical exam.  Procedure(s): ENDARTERECTOMY CAROTID (LEFT)  Extubated: POD # 0  Physical exam:  A&Ox3, NAD Face: Symmetrical.  Tongue midline. Neck: Trachea midline  Incision: Clean dry and intact.  Dermabond intact.  No swelling or ecchymosis noted. Heart: Regular rate and rhythm Pulmonary: Clear to auscultation bilaterally Abdomen: Soft, nontender, nondistended with positive bowel sounds GU: Foley intact, draining clear urine - to be removed Extremities: Warm distally to toes, minimal edema, warm, nontender Neuro: Upper/lower 5 out of 5, no neurological deficits noted on exam  Labs as below  Complications: None  Consults: None  Significant Diagnostic Studies: CBC Lab Results  Component Value Date   WBC 14.4 (H) 06/12/2019   HGB 12.7 (L) 06/12/2019   HCT 37.5 (L) 06/12/2019   MCV 82.2 06/12/2019   PLT 175 06/12/2019   BMET    Component Value Date/Time   NA 135 06/12/2019 0405   K 4.4 06/12/2019 0405   CL 105 06/12/2019 0405   CO2 24 06/12/2019 0405   GLUCOSE 203 (H) 06/12/2019 0405   BUN 17 06/12/2019 0405   CREATININE 0.94 06/12/2019 0405   CALCIUM 8.3 (L) 06/12/2019  0405   GFRNONAA >60 06/12/2019 0405   GFRAA >60 06/12/2019 0405   COAG Lab Results  Component Value Date   INR 1.0 06/02/2019   INR 1.0 02/13/2019   Disposition:  Discharge to :Home  Allergies as of 06/12/2019   No Known Allergies     Medication  List    TAKE these medications   amLODipine 10 MG tablet Commonly known as: NORVASC Take 1 tablet (10 mg total) by mouth daily. Please schedule office visit before any future refills   aspirin 81 MG chewable tablet Chew 81 mg by mouth daily.   atorvastatin 80 MG tablet Commonly known as: LIPITOR Take 1 tablet (80 mg total) by mouth daily.   clopidogrel 75 MG tablet Commonly known as: PLAVIX Take 1 tablet (75 mg total) by mouth daily with breakfast.   esomeprazole 40 MG capsule Commonly known as: NEXIUM Take 1 capsule (40 mg total) by mouth every evening. One daily   naproxen sodium 220 MG tablet Commonly known as: ALEVE Take 440 mg by mouth daily as needed.   oxyCODONE-acetaminophen 5-325 MG tablet Commonly known as: PERCOCET/ROXICET Take 1 tablet by mouth every 6 (six) hours as needed for moderate pain or severe pain (Pain).   vitamin B-12 500 MCG tablet Commonly known as: CYANOCOBALAMIN Take 500 mcg by mouth daily.      Verbal and written Discharge instructions given to the patient. Wound care per Discharge AVS Follow-up Information    Dew, Erskine Squibb, MD Follow up in 3 week(s).   Specialties: Vascular Surgery, Radiology, Interventional Cardiology Why: Can see Arna Medici. First post-op check. Will need Carotid Duplex with study.  Contact information: [place] [address] [phone]          Signed: Sela Hua, PA-C  06/12/2019, 10:45 AM

## 2019-06-15 ENCOUNTER — Other Ambulatory Visit (INDEPENDENT_AMBULATORY_CARE_PROVIDER_SITE_OTHER): Payer: Self-pay | Admitting: Nurse Practitioner

## 2019-06-15 LAB — SURGICAL PATHOLOGY

## 2019-06-23 ENCOUNTER — Other Ambulatory Visit: Payer: Self-pay | Admitting: Family Medicine

## 2019-06-23 DIAGNOSIS — K209 Esophagitis, unspecified without bleeding: Secondary | ICD-10-CM

## 2019-06-23 NOTE — Telephone Encounter (Signed)
Requested medication (s) are due for refill today: yes  Requested medication (s) are on the active medication list: yes  Last refill:  05/20/2019  Future visit scheduled: no  Notes to clinic:  overdue for follow up    Requested Prescriptions  Pending Prescriptions Disp Refills   esomeprazole ([place]) 40 MG capsule [Pharmacy Med Name: ESOMEPRAZOLE MAGNESIUM 40 MG CAP] 90 capsule 0    Sig: TAKE 1 CAPSULE BY MOUTH ONCE EVERY [place]      Gastroenterology: Proton Pump Inhibitors Failed - 06/23/2019 12:30 PM      Failed - Valid encounter within last 12 months    Recent Outpatient Visits           1 year ago Right flank pain   [facility] [place], Dionne Bucy, MD   1 year ago Patellofemoral pain syndrome of right knee   [facility] [place], [place], [place]   1 year ago Acute viral syndrome   [facility] [place], [place], [place]   2 years ago Essential hypertension   [place], [place], [place]   2 years ago Essential hypertension   [place], [place]bie [place], [place]       Future Appointments             In 6 days [place], Ronda Fairly, MD [place]

## 2019-06-29 ENCOUNTER — Ambulatory Visit: Payer: 59 | Admitting: Urology

## 2019-06-29 ENCOUNTER — Encounter: Payer: Self-pay | Admitting: Urology

## 2019-06-30 ENCOUNTER — Other Ambulatory Visit (INDEPENDENT_AMBULATORY_CARE_PROVIDER_SITE_OTHER): Payer: Self-pay | Admitting: Vascular Surgery

## 2019-06-30 ENCOUNTER — Ambulatory Visit: Payer: 59 | Admitting: Urology

## 2019-06-30 DIAGNOSIS — I6523 Occlusion and stenosis of bilateral carotid arteries: Secondary | ICD-10-CM

## 2019-06-30 DIAGNOSIS — Z9889 Other specified postprocedural states: Secondary | ICD-10-CM

## 2019-07-01 ENCOUNTER — Ambulatory Visit: Payer: 59 | Admitting: Urology

## 2019-07-13 ENCOUNTER — Other Ambulatory Visit: Payer: Self-pay

## 2019-07-13 ENCOUNTER — Ambulatory Visit (INDEPENDENT_AMBULATORY_CARE_PROVIDER_SITE_OTHER): Payer: 59 | Admitting: Nurse Practitioner

## 2019-07-13 ENCOUNTER — Ambulatory Visit (INDEPENDENT_AMBULATORY_CARE_PROVIDER_SITE_OTHER): Payer: 59

## 2019-07-13 ENCOUNTER — Encounter (INDEPENDENT_AMBULATORY_CARE_PROVIDER_SITE_OTHER): Payer: Self-pay | Admitting: Nurse Practitioner

## 2019-07-13 VITALS — BP 133/66 | HR 82 | Resp 16 | Wt 180.8 lb

## 2019-07-13 DIAGNOSIS — I6523 Occlusion and stenosis of bilateral carotid arteries: Secondary | ICD-10-CM

## 2019-07-13 DIAGNOSIS — Z9889 Other specified postprocedural states: Secondary | ICD-10-CM

## 2019-07-13 DIAGNOSIS — E785 Hyperlipidemia, unspecified: Secondary | ICD-10-CM

## 2019-07-13 DIAGNOSIS — M79604 Pain in right leg: Secondary | ICD-10-CM

## 2019-07-13 DIAGNOSIS — I10 Essential (primary) hypertension: Secondary | ICD-10-CM

## 2019-07-13 NOTE — Progress Notes (Signed)
SUBJECTIVE:  Patient ID: Shawn Sherman, male    DOB: June 25, 1956, 64 y.o.   MRN: [id number] Chief Complaint  Patient presents with  . Follow-up    [facility] 3week carotid endartectomy    HPI  Shawn Sherman is a 64 y.o. male the presents today for follow-up after left carotid endarterectomy on 06/11/2019.  Today the incision site is nonswelling and it appears mostly healed.  The patient had a previous right carotid endarterectomy on 02/18/2019.  That scar is completely healed.  The patient has no complaints about his neck however he does endorse having some leg pain following surgery.  He states that the pain begins at his knee and it radiates up to his hip.  However sometimes it goes from his buttocks to his knee.  He states that the pain is better when he is laying down with leg elevation.  Ambulation does not make the pain better or worse.  He denies any cramping-like sensation.  He states that is more of a dull throbbing ache.  However this ache is enough to disturb his sleep.  The patient does have a history of back surgeries.  The patient also endorses not having a lot of movement after his surgery.  He states that he was mostly in bed.  Patient denies any fever, chills, nausea, vomiting or diarrhea.  He denies any chest pain or shortness of breath.  Today patient underwent noninvasive studies which showed no evidence of stenosis in the bilateral internal carotid arteries.  There is significant improvement compared to the previous studies.  Past Medical History:  Diagnosis Date  . Anginal pain (HCC) 08/2018   a. 08/2018 - atyp c/p and neg trop; b. 08/2018 MV: EF 57%, inferolateral horizontal ST depression w/o ischemia or infarct.  . Arthritis   . Carotid arterial disease (HCC)    a. 01/2019 Carotid U/S: RICA 80-90, RCCA <50, RECA>50, LICA 80-99, LCCA>50, LECA>50; b. 01/2019 CT Head/Neck: RICA string sign in bulb - lumen <56mm. Sev prox RECA dzs. LICA 60 @ dist bulb. R vert 50-70, L vert 30-50.  .  Carotid bruit present 08/2018   left  . Chewing tobacco nicotine dependence   . Essential hypertension 03/29/2017  . GERD (gastroesophageal reflux disease)   . History of kidney stones   . Hyperlipidemia     Past Surgical History:  Procedure Laterality Date  . ANTERIOR LAT LUMBAR FUSION Right 01/01/2013   Procedure: lumbar three-four extreme lateral interbody fusion;  Surgeon: Reinaldo Meeker, MD;  Location: [facility];  Service: Neurosurgery;  Laterality: Right;  Lumbar three-four extreme lateral interbody fusion    . BACK SURGERY  08  . COLONOSCOPY    . ENDARTERECTOMY Right 02/18/2019   Procedure: ENDARTERECTOMY CAROTID;  Surgeon: Annice Needy, MD;  Location: [facility];  Service: Vascular;  Laterality: Right;  . ENDARTERECTOMY Left 06/11/2019   Procedure: ENDARTERECTOMY CAROTID;  Surgeon: Annice Needy, MD;  Location: [facility];  Service: Vascular;  Laterality: Left;  . ESOPHAGOGASTRODUODENOSCOPY (EGD) WITH PROPOFOL N/A 07/05/2015   Procedure: ESOPHAGOGASTRODUODENOSCOPY (EGD) WITH PROPOFOL;  Surgeon: Midge Minium, MD;  Location: [facility];  Service: Endoscopy;  Laterality: N/A;  . EXTRACORPOREAL SHOCK WAVE LITHOTRIPSY Right 09/11/2018   Procedure: EXTRACORPOREAL SHOCK WAVE LITHOTRIPSY (ESWL);  Surgeon: Riki Altes, MD;  Location: [facility];  Service: Urology;  Laterality: Right;  . LUMBAR PERCUTANEOUS PEDICLE SCREW 1 LEVEL N/A 01/01/2013   Procedure: lumbar three-four percutaneous pedicle screws ;  Surgeon: Rolanda Lundborg  Kritzer, MD;  Location: [facility];  Service: Neurosurgery;  Laterality: N/A;  lumbar three-four percutaneous pedicle screws    Social History   Socioeconomic History  . Marital status: Married    Spouse name: Joyce Gross  . Number of children: Not on file  . Years of education: Not on file  . Highest education level: Not on file  Occupational History  . Not on file  Tobacco Use  . Smoking status: Never Smoker  . Smokeless tobacco: Current User    Types: Snuff    Substance and Sexual Activity  . Alcohol use: No  . Drug use: No  . Sexual activity: Yes    Birth control/protection: None  Other Topics Concern  . Not on file  Social History Narrative  . Not on file   Social Determinants of Health   Financial Resource Strain:   . Difficulty of Paying Living Expenses: Not on file  Food Insecurity:   . Worried About Programme researcher, broadcasting/film/video in the Last Year: Not on file  . Ran Out of Food in the Last Year: Not on file  Transportation Needs:   . Lack of Transportation (Medical): Not on file  . Lack of Transportation (Non-Medical): Not on file  Physical Activity:   . Days of Exercise per Week: Not on file  . Minutes of Exercise per Session: Not on file  Stress:   . Feeling of Stress : Not on file  Social Connections:   . Frequency of Communication with Friends and Family: Not on file  . Frequency of Social Gatherings with Friends and Family: Not on file  . Attends Religious Services: Not on file  . Active Member of Clubs or Organizations: Not on file  . Attends Banker Meetings: Not on file  . Marital Status: Not on file  Intimate Partner Violence:   . Fear of Current or Ex-Partner: Not on file  . Emotionally Abused: Not on file  . Physically Abused: Not on file  . Sexually Abused: Not on file    Family History  Problem Relation Age of Onset  . Cancer Mother   . Cancer Father   . COPD Sister     No Known Allergies   Review of Systems   Review of Systems: Negative Unless Checked Constitutional: [] Weight loss  [] Fever  [] Chills Cardiac: [] Chest pain   []  Atrial Fibrillation  [] Palpitations   [] Shortness of breath when laying flat   [] Shortness of breath with exertion. [] Shortness of breath at rest Vascular:  [] Pain in legs with walking   [x] Pain in legs with standing [x] Pain in legs when laying flat   [] Claudication    [] Pain in feet when laying flat    [] History of DVT   [] Phlebitis   [] Swelling in legs   [] Varicose veins    [] Non-healing ulcers Pulmonary:   [] Uses home oxygen   [] Productive cough   [] Hemoptysis   [] Wheeze  [] COPD   [] Asthma Neurologic:  [] Dizziness   [] Seizures  [] Blackouts [] History of stroke   [] History of TIA  [] Aphasia   [] Temporary Blindness   [] Weakness or numbness in arm   [] Weakness or numbness in leg Musculoskeletal:   [] Joint swelling   [] Joint pain   [x] Low back pain  []  History of Knee Replacement [x] Arthritis [x] back Surgeries  []  Spinal Stenosis    Hematologic:  [] Easy bruising  [] Easy bleeding   [] Hypercoagulable state   [] Anemic Gastrointestinal:  [] Diarrhea   [] Vomiting  [x] Gastroesophageal reflux/heartburn   [] Difficulty  swallowing. [] Abdominal pain Genitourinary:  [] Chronic kidney disease   [] Difficult urination  [] Anuric   [] Blood in urine [] Frequent urination  [] Burning with urination   [] Hematuria Skin:  [] Rashes   [] Ulcers [] Wounds Psychological:  [] History of anxiety   []  History of major depression  []  Memory Difficulties      OBJECTIVE:   Physical Exam  BP 133/66 (BP Location: Right Arm)   Pulse 82   Resp 16   Wt 180 lb 12.8 oz (82 kg)   BMI 25.94 kg/m   Gen: WD/WN, NAD Head: [place]/AT, No temporalis wasting.  Ear/Nose/Throat: Hearing grossly intact, nares w/o erythema or drainage Eyes: PER, EOMI, sclera nonicteric.  Neck: Supple, no masses.  No JVD.  Pulmonary:  Good air movement, no use of accessory muscles.  Cardiac: RRR Vascular:  Bilateral feet warm, good color Vessel Right Left  Radial Palpable Palpable  Dorsalis Pedis Palpable Palpable  Posterior Tibial Palpable Palpable   Gastrointestinal: soft, non-distended. No guarding/no peritoneal signs.  Musculoskeletal: M/S 5/5 throughout.  No deformity or atrophy. Pain with lateral hip flexion Neurologic: Pain and light touch intact in extremities.  Symmetrical.  Speech is fluent. Motor exam as listed above. Psychiatric: Judgment intact, Mood & affect appropriate for pt's clinical situation. Dermatologic: No  Venous rashes. No Ulcers Noted.  No changes consistent with cellulitis. Lymph : No Cervical lymphadenopathy, no lichenification or skin changes of chronic lymphedema.       ASSESSMENT AND PLAN:  1. Bilateral carotid artery stenosis Recommend:  The patient is s/p successful Left CEA on 06/11/2019 and Right CEA on 02/18/2019.    Duplex ultrasound preoperatively shows no stenosis bilaterally   Continue antiplatelet therapy as prescribed Continue management of CAD, HTN and Hyperlipidemia Healthy heart diet,  encouraged exercise at least 4 times per week  Follow up in 3 months with duplex ultrasound and physical exam based on the patient's carotid surgery   2. Essential hypertension Continue antihypertensive medications as already ordered, these medications have been reviewed and there are no changes at this time.   3. Hyperlipidemia, unspecified hyperlipidemia type Continue statin as ordered and reviewed, no changes at this time   4. Pain of right lower extremity Based on physical exam as well as patient's description of pain this is more likely related to his back and or possibly knees.  Physical exam does not suggest vascular compromise due to strong pulses as well as a warm foot with no discoloration.  There is no edema, erythema, or warmth which could be suggestive of a DVT.  This may be a component of arthritis in which case I have advised the patient to alternate Aleve and Tylenol and he can also utilize ice in the area.  I have also advised the patient to reach out to his primary care physician for further work-up.   Current Outpatient Medications on File Prior to Visit  Medication Sig Dispense Refill  . amLODipine (NORVASC) 10 MG tablet Take 1 tablet (10 mg total) by mouth daily. Please schedule office visit before any future refills 90 tablet 0  . aspirin 81 MG chewable tablet Chew 81 mg by mouth daily.    Marland Kitchen atorvastatin (LIPITOR) 80 MG tablet Take 1 tablet (80 mg total) by  mouth daily. 90 tablet 3  . clopidogrel (PLAVIX) 75 MG tablet Take 1 tablet (75 mg total) by mouth daily with breakfast. 90 tablet 3  . esomeprazole (NEXIUM) 40 MG capsule TAKE 1 CAPSULE BY MOUTH ONCE EVERY EVENING 90 capsule 0  .  naproxen sodium (ALEVE) 220 MG tablet Take 440 mg by mouth daily as needed.    Marland Kitchen oxyCODONE-acetaminophen (PERCOCET/ROXICET) 5-325 MG tablet Take 1 tablet by mouth every 6 (six) hours as needed for moderate pain or severe pain (Pain). 15 tablet 0  . vitamin B-12 (CYANOCOBALAMIN) 500 MCG tablet Take 500 mcg by mouth daily.     No current facility-administered medications on file prior to visit.    There are no Patient Instructions on file for this visit. No follow-ups on file.   Georgiana Spinner, NP  This note was completed with Office manager.  Any errors are purely unintentional.

## 2019-07-29 ENCOUNTER — Other Ambulatory Visit: Payer: Self-pay | Admitting: Family Medicine

## 2019-07-29 DIAGNOSIS — I10 Essential (primary) hypertension: Secondary | ICD-10-CM

## 2019-08-23 ENCOUNTER — Other Ambulatory Visit: Payer: Self-pay | Admitting: Family Medicine

## 2019-08-23 DIAGNOSIS — I10 Essential (primary) hypertension: Secondary | ICD-10-CM

## 2019-09-14 ENCOUNTER — Other Ambulatory Visit (INDEPENDENT_AMBULATORY_CARE_PROVIDER_SITE_OTHER): Payer: Self-pay | Admitting: Vascular Surgery

## 2019-09-14 DIAGNOSIS — Z9889 Other specified postprocedural states: Secondary | ICD-10-CM

## 2019-09-15 ENCOUNTER — Ambulatory Visit (INDEPENDENT_AMBULATORY_CARE_PROVIDER_SITE_OTHER): Payer: 59

## 2019-09-15 ENCOUNTER — Encounter (INDEPENDENT_AMBULATORY_CARE_PROVIDER_SITE_OTHER): Payer: Self-pay | Admitting: Vascular Surgery

## 2019-09-15 ENCOUNTER — Other Ambulatory Visit: Payer: Self-pay

## 2019-09-15 ENCOUNTER — Ambulatory Visit (INDEPENDENT_AMBULATORY_CARE_PROVIDER_SITE_OTHER): Payer: 59 | Admitting: Vascular Surgery

## 2019-09-15 VITALS — BP 133/80 | HR 70 | Resp 16 | Ht 70.0 in | Wt 179.0 lb

## 2019-09-15 DIAGNOSIS — I6523 Occlusion and stenosis of bilateral carotid arteries: Secondary | ICD-10-CM | POA: Diagnosis not present

## 2019-09-15 DIAGNOSIS — E785 Hyperlipidemia, unspecified: Secondary | ICD-10-CM

## 2019-09-15 DIAGNOSIS — I10 Essential (primary) hypertension: Secondary | ICD-10-CM

## 2019-09-15 DIAGNOSIS — Z9889 Other specified postprocedural states: Secondary | ICD-10-CM | POA: Diagnosis not present

## 2019-09-15 NOTE — Assessment & Plan Note (Signed)
Duplex today shows patent endarterectomies without obvious recurrent stenosis and some mild elevated velocities in the left common carotid artery.  Doing well status post bilateral carotid endarterectomies last year.  The most recent was about 3-1/2 months ago and the original one was about 6 months ago.  Continue current medical regimen.  Recheck in 6 months.

## 2019-09-15 NOTE — Patient Instructions (Signed)
Carotid Artery Disease  Carotid artery disease is the narrowing or blockage of one or both carotid arteries. This condition is also called carotid artery stenosis. The carotid arteries are the two main blood vessels on either side of the neck. They send blood to the brain, other parts of the head, and the neck.  This condition increases your risk for a stroke or a transient ischemic attack (TIA). A TIA is a "mini-stroke" that causes stroke-like symptoms that go away quickly. What are the causes? This condition is mainly caused by a narrowing and hardening of the carotid arteries. The carotid arteries can become narrow or clogged with a buildup of plaque. Plaque includes:  Fat.  Cholesterol.  Calcium.  Other substances. What increases the risk? The following factors may make you more likely to develop this condition:  Having certain medical conditions, such as: ? High cholesterol. ? High blood pressure. ? Diabetes. ? Obesity.  Smoking.  A family history of cardiovascular disease.  Not being active or lack of regular exercise.  Being male. Men have a higher risk of having arteries become narrow and harden earlier in life than women.  Old age. What are the signs or symptoms? This condition may not have any signs or symptoms until a stroke or TIA happens. In some cases, your doctor may be able to hear a whooshing sound. This can suggest a change in blood flow caused by plaque buildup. An eye exam can also help find signs of the condition. How is this treated? This condition may be treated with more than one treatment. Treatment options include:  Lifestyle changes, such as: ? Quitting smoking. ? Getting regular exercise, or getting exercise as told by your doctor. ? Eating a healthy diet. ? Managing stress. ? Keeping a healthy weight.  Medicines to control: ? Blood pressure. ? Cholesterol. ? Blood clotting.  Surgery. You may have: ? A surgery to remove the blockages in  the carotid arteries. ? A procedure in which a small mesh tube (stent) is used to widen the blocked carotid arteries. Follow these instructions at home: Eating and drinking Follow instructions about your diet from your doctor. It is important to follow a healthy diet.  Eat a diet that includes: ? A lot of fresh fruits and vegetables. ? Low-fat (lean) meats.  Avoid these foods: ? Foods that are high in fat. ? Foods that are high in salt (sodium). ? Foods that are fried. ? Foods that are processed. ? Foods that have few good nutrients (poor nutritional value).  Lifestyle   Keep a healthy weight.  Do exercises as told by your doctor to stay active. Each week, you should get one of the following: ? At least 150 minutes of exercise that raises your heart rate and makes you sweat (moderate-intensity exercise). ? At least 75 minutes of exercise that takes a lot of effort.  Do not use any products that contain nicotine or tobacco, such as cigarettes, e-cigarettes, and chewing tobacco. If you need help quitting, ask your doctor.  Do not drink alcohol if: ? Your doctor tells you not to drink. ? You are pregnant, may be pregnant, or are planning to become pregnant.  If you drink alcohol: ? Limit how much you use to:  0-1 drink a day for women.  0-2 drinks a day for men. ? Be aware of how much alcohol is in your drink. In the [place] one drink equals one 12 oz bottle of beer (355 mL), one 5   oz glass of wine (148 mL), or one 1 oz glass of hard liquor (44 mL).  Do not use drugs.  Manage your stress. Ask your doctor for tips on how to do this. General instructions  Take over-the-counter and prescription medicines only as told by your doctor.  Keep all follow-up visits as told by your doctor. This is important. Where to find more information  American Heart Association: www.heart.org Get help right away if:  You have any signs of a stroke. "BE FAST" is an easy way to remember the  main warning signs: ? B - Balance. Signs are dizziness, sudden trouble walking, or loss of balance. ? E - Eyes. Signs are trouble seeing or a change in how you see. ? F - Face. Signs are sudden weakness or loss of feeling of the face, or the face or eyelid drooping on one side. ? A - Arms. Signs are weakness or loss of feeling in an arm. This happens suddenly and usually on one side of the body. ? S - Speech. Signs are sudden trouble speaking, slurred speech, or trouble understanding what people say. ? T - Time. Time to call emergency services. Write down what time symptoms started.  You have other signs of a stroke, such as: ? A sudden, very bad headache with no known cause. ? Feeling like you may vomit (nausea). ? Vomiting. ? A seizure. These symptoms may be an emergency. Do not wait to see if the symptoms will go away. Get medical help right away. Call your local emergency services (911 in the [place] Do not drive yourself to the hospital. Summary  The carotid arteries are blood vessels on both sides of the neck.  If these arteries get smaller or get blocked, you are more likely to have a stroke or a mini-stroke.  This condition can be treated with lifestyle changes, medicines, surgery, or a blend of these treatments.  Get help right away if you have any signs of a stroke. "BE FAST" is an easy way to remember the main warning signs of stroke. This information is not intended to replace advice given to you by your health care provider. Make sure you discuss any questions you have with your health care provider. Document Revised: 01/19/2019 Document Reviewed: 01/19/2019 [employer]vier Patient Education  2020 [employer]  

## 2019-09-15 NOTE — Progress Notes (Signed)
MRN : [id number]  Shawn Sherman is a 64 y.o. (Oct 30, 1955) male who presents with chief complaint of  Chief Complaint  Patient presents with  . Follow-up    ultrasound follow up   .  History of Present Illness: Patient returns in follow-up after bilateral carotid endarterectomies last year.  He has done well without any major issues or problems following surgery.  No complaints today.  Neck incisions are well-healed.  Duplex today shows patent endarterectomies without obvious recurrent stenosis and some mild elevated velocities in the left common carotid artery.  Current Outpatient Medications  Medication Sig Dispense Refill  . amLODipine (NORVASC) 10 MG tablet Take 1 tablet (10 mg total) by mouth daily. Please schedule office visit before any future refills 90 tablet 0  . aspirin 81 MG chewable tablet Chew 81 mg by mouth daily.    Marland Kitchen atorvastatin (LIPITOR) 80 MG tablet Take 1 tablet (80 mg total) by mouth daily. 90 tablet 3  . clopidogrel (PLAVIX) 75 MG tablet Take 1 tablet (75 mg total) by mouth daily with breakfast. 90 tablet 3  . esomeprazole (NEXIUM) 40 MG capsule TAKE 1 CAPSULE BY MOUTH ONCE EVERY EVENING 90 capsule 0  . naproxen sodium (ALEVE) 220 MG tablet Take 440 mg by mouth daily as needed.    . vitamin B-12 (CYANOCOBALAMIN) 500 MCG tablet Take 500 mcg by mouth daily.    Marland Kitchen oxyCODONE-acetaminophen (PERCOCET/ROXICET) 5-325 MG tablet Take 1 tablet by mouth every 6 (six) hours as needed for moderate pain or severe pain (Pain). (Patient not taking: Reported on 09/15/2019) 15 tablet 0   No current facility-administered medications for this visit.    Past Medical History:  Diagnosis Date  . Anginal pain ([place]) 08/2018   a. 08/2018 - atyp c/p and neg trop; b. 08/2018 MV: EF 57%, inferolateral horizontal ST depression w/o ischemia or infarct.  . Arthritis   . Carotid arterial disease ([place])    a. 01/2019 Carotid U/S: RICA 80-90, RCCA <27, OJJK>09, LICA 38-18, EXHB>71, LECA>50; b. 01/2019  CT Head/Neck: RICA string sign in bulb - lumen <76mm. Sev prox RECA dzs. LICA 60 @ dist bulb. R vert 50-70, L vert 30-50.  . Carotid bruit present 08/2018   left  . Chewing tobacco nicotine dependence   . Essential hypertension 03/29/2017  . GERD (gastroesophageal reflux disease)   . History of kidney stones   . Hyperlipidemia     Past Surgical History:  Procedure Laterality Date  . ANTERIOR LAT LUMBAR FUSION Right 01/01/2013   Procedure: lumbar three-four extreme lateral interbody fusion;  Surgeon: Faythe Ghee, MD;  Location: [facility];  Service: Neurosurgery;  Laterality: Right;  Lumbar three-four extreme lateral interbody fusion    . BACK SURGERY  08  . COLONOSCOPY    . ENDARTERECTOMY Right 02/18/2019   Procedure: ENDARTERECTOMY CAROTID;  Surgeon: Algernon Huxley, MD;  Location: [facility];  Service: Vascular;  Laterality: Right;  . ENDARTERECTOMY Left 06/11/2019   Procedure: ENDARTERECTOMY CAROTID;  Surgeon: Algernon Huxley, MD;  Location: [facility];  Service: Vascular;  Laterality: Left;  . ESOPHAGOGASTRODUODENOSCOPY (EGD) WITH PROPOFOL N/A 07/05/2015   Procedure: ESOPHAGOGASTRODUODENOSCOPY (EGD) WITH PROPOFOL;  Surgeon: Lucilla Lame, MD;  Location: [facility];  Service: Endoscopy;  Laterality: N/A;  . EXTRACORPOREAL SHOCK WAVE LITHOTRIPSY Right 09/11/2018   Procedure: EXTRACORPOREAL SHOCK WAVE LITHOTRIPSY (ESWL);  Surgeon: Abbie Sons, MD;  Location: [facility];  Service: Urology;  Laterality: Right;  . LUMBAR PERCUTANEOUS PEDICLE SCREW 1  LEVEL N/A 01/01/2013   Procedure: lumbar three-four percutaneous pedicle screws ;  Surgeon: Reinaldo Meeker, MD;  Location: [facility];  Service: Neurosurgery;  Laterality: N/A;  lumbar three-four percutaneous pedicle screws     Social History   Tobacco Use  . Smoking status: Never Smoker  . Smokeless tobacco: Current User    Types: Snuff  Substance Use Topics  . Alcohol use: No  . Drug use: No    Family History  Problem Relation Age of  Onset  . Cancer Mother   . Cancer Father   . COPD Sister      No Known Allergies   REVIEW OF SYSTEMS (Negative unless checked)  Constitutional: [] Weight loss  [] Fever  [] Chills Cardiac: [] Chest pain   [] Chest pressure   [] Palpitations   [] Shortness of breath when laying flat   [] Shortness of breath at rest   [] Shortness of breath with exertion. Vascular:  [] Pain in legs with walking   [] Pain in legs at rest   [] Pain in legs when laying flat   [] Claudication   [] Pain in feet when walking  [] Pain in feet at rest  [] Pain in feet when laying flat   [] History of DVT   [] Phlebitis   [] Swelling in legs   [] Varicose veins   [] Non-healing ulcers Pulmonary:   [] Uses home oxygen   [] Productive cough   [] Hemoptysis   [] Wheeze  [] COPD   [] Asthma Neurologic:  [x] Dizziness  [] Blackouts   [] Seizures   [] History of stroke   [] History of TIA  [] Aphasia   [] Temporary blindness   [] Dysphagia   [] Weakness or numbness in arms   [] Weakness or numbness in legs Musculoskeletal:  [x] Arthritis   [] Joint swelling   [] Joint pain   [] Low back pain Hematologic:  [] Easy bruising  [] Easy bleeding   [] Hypercoagulable state   [] Anemic  [] Hepatitis Gastrointestinal:  [] Blood in stool   [] Vomiting blood  [x] Gastroesophageal reflux/heartburn   [] Difficulty swallowing. Genitourinary:  [] Chronic kidney disease   [] Difficult urination  [] Frequent urination  [] Burning with urination   [] Blood in urine Skin:  [] Rashes   [] Ulcers   [] Wounds Psychological:  [] History of anxiety   []  History of major depression.  Physical Examination  Vitals:   09/15/19 1343  BP: 133/80  Pulse: 70  Resp: 16  Weight: 179 lb (81.2 kg)  Height: 5\' 10"  (1.778 m)   Body mass index is 25.68 kg/m. Gen:  WD/WN, NAD Head: [place]/AT, No temporalis wasting. Ear/Nose/Throat: Hearing grossly intact, nares w/o erythema or drainage, trachea midline Eyes: Conjunctiva clear. Sclera non-icteric Neck: Supple.  No carotid bruit present Pulmonary:  Good air  movement, equal and clear to auscultation bilaterally.  Cardiac: RRR, No JVD Vascular:  Vessel Right Left  Radial Palpable Palpable               Musculoskeletal: M/S 5/5 throughout.  No deformity or atrophy.  No edema. Neurologic: CN 2-12 intact. Sensation grossly intact in extremities.  Symmetrical.  Speech is fluent. Motor exam as listed above. Psychiatric: Judgment intact, Mood & affect appropriate for pt's clinical situation. Dermatologic: No rashes or ulcers noted.  No cellulitis or open wounds.      CBC Lab Results  Component Value Date   WBC 14.4 (H) 06/12/2019   HGB 12.7 (L) 06/12/2019   HCT 37.5 (L) 06/12/2019   MCV 82.2 06/12/2019   PLT 175 06/12/2019    BMET    Component Value Date/Time   NA 135 06/12/2019 0405   K  4.4 06/12/2019 0405   CL 105 06/12/2019 0405   CO2 24 06/12/2019 0405   GLUCOSE 203 (H) 06/12/2019 0405   BUN 17 06/12/2019 0405   CREATININE 0.94 06/12/2019 0405   CALCIUM 8.3 (L) 06/12/2019 0405   GFRNONAA >60 06/12/2019 0405   GFRAA >60 06/12/2019 0405   CrCl cannot be calculated (Patient's most recent lab result is older than the maximum 21 days allowed.).  COAG Lab Results  Component Value Date   INR 1.0 06/02/2019   INR 1.0 02/13/2019    Radiology No results found.   Assessment/Plan Hyperlipidemia lipid control important in reducing the progression of atherosclerotic disease. Continue statin therapy   Essential hypertension blood pressure control important in reducing the progression of atherosclerotic disease. On appropriate oral medications.  Carotid stenosis Duplex today shows patent endarterectomies without obvious recurrent stenosis and some mild elevated velocities in the left common carotid artery.  Doing well status post bilateral carotid endarterectomies last year.  The most recent was about 3-1/2 months ago and the original one was about 6 months ago.  Continue current medical regimen.  Recheck in 6 months.     Festus Barren, MD  09/15/2019 2:40 PM    This note was created with Dragon medical transcription system.  Any errors from dictation are purely unintentional

## 2019-09-20 ENCOUNTER — Other Ambulatory Visit: Payer: Self-pay | Admitting: Family Medicine

## 2019-09-20 DIAGNOSIS — K209 Esophagitis, unspecified without bleeding: Secondary | ICD-10-CM

## 2019-09-20 NOTE — Telephone Encounter (Signed)
Requested medication (s) are due for refill today: yes  Requested medication (s) are on the active medication list: yes  Last refill:  06/24/19  Future visit scheduled: no  Notes to clinic:  >3 months overdue for appt and no upcoming appt scheduled   Requested Prescriptions  Pending Prescriptions Disp Refills   esomeprazole (NEXIUM) 40 MG capsule [Pharmacy Med Name: ESOMEPRAZOLE MAGNESIUM 40 MG CAP] 90 capsule 0    Sig: TAKE 1 CAPSULE BY MOUTH ONCE EVERY EVENING      Gastroenterology: Proton Pump Inhibitors Failed - 09/20/2019  1:59 PM      Failed - Valid encounter within last 12 months    Recent Outpatient Visits           1 year ago Right flank pain   [facility] [place], Marzella Schlein, [place]   1 year ago Patellofemoral pain syndrome of right knee   [facility] [place], [place], [place]   1 year ago Acute viral syndrome   [facility] [place], [place], [place]   2 years ago Essential hypertension   [facility] [place], [place], [place]   2 years ago Essential hypertension   [facility] [place], [place], [place]

## 2019-10-13 ENCOUNTER — Telehealth: Payer: Self-pay

## 2019-10-13 NOTE — Telephone Encounter (Signed)
Copied from CRM [id number]. Topic: General - Inquiry >> Oct 13, 2019  2:06 PM Leary Roca wrote: Reason for CRM: pt was told to call office at 11:30 today however was in physical therapy and wasn't able to call please advise

## 2019-10-14 NOTE — Telephone Encounter (Signed)
Called patient and left a message for him to contact the clinic with more information about his instruction to call the clinic.

## 2019-12-14 ENCOUNTER — Encounter: Payer: Self-pay | Admitting: Family

## 2019-12-14 ENCOUNTER — Other Ambulatory Visit: Payer: Self-pay

## 2019-12-14 ENCOUNTER — Ambulatory Visit (INDEPENDENT_AMBULATORY_CARE_PROVIDER_SITE_OTHER): Payer: 59 | Admitting: Family

## 2019-12-14 VITALS — BP 142/84 | HR 85 | Ht 70.0 in | Wt 177.2 lb

## 2019-12-14 DIAGNOSIS — I10 Essential (primary) hypertension: Secondary | ICD-10-CM | POA: Diagnosis not present

## 2019-12-14 DIAGNOSIS — Z79899 Other long term (current) drug therapy: Secondary | ICD-10-CM

## 2019-12-14 DIAGNOSIS — E782 Mixed hyperlipidemia: Secondary | ICD-10-CM | POA: Diagnosis not present

## 2019-12-14 DIAGNOSIS — I779 Disorder of arteries and arterioles, unspecified: Secondary | ICD-10-CM

## 2019-12-14 MED ORDER — AMLODIPINE BESYLATE 10 MG PO TABS: 10.0000 mg | ORAL_TABLET | Freq: Every day | ORAL | 1 refills | Status: DC

## 2019-12-14 NOTE — Patient Instructions (Addendum)
Medication Instructions:  Your physician has recommended you make the following change in your medication:   RESUME your Amlodipine (Norvasc) 10mg  daily  *If you need a refill on your cardiac medications before your next appointment, please call your pharmacy*   Lab Work: Your physician recommends that you return for lab work today: CBC, lipid panel, CMP  If you have labs (blood work) drawn today and your tests are completely normal, you will receive your results only by: MyChart Message (if you have MyChart) OR . A paper copy in the mail If you have any lab test that is abnormal or we need to change your treatment, we will call you to review the results.  Testing/Procedures: Your EKG today showed normal sinus rhythm which is a great result.   Follow-Up: At [facility], you and your health needs are our priority.  As part of our continuing mission to provide you with exceptional heart care, we have created designated Provider Care Teams.  These Care Teams include your primary Cardiologist (physician) and Advanced Practice Providers (APPs -  Physician Assistants and Nurse Practitioners) who all work together to provide you with the care you need, when you need it.  We recommend signing up for the patient portal called "MyChart".  Sign up information is provided on this After Visit Summary.  MyChart is used to connect with patients for Virtual Visits (Telemedicine).  Patients are able to view lab/test results, encounter notes, upcoming appointments, etc.  Non-urgent messages can be sent to your provider as well.   To learn more about what you can do with MyChart, go to [facility].    Your next appointment:   3 month(s)  The format for your next appointment:   In Person  Provider:   You may see [email or web address], MD or one of the following Advanced Practice Providers on your designated Care Team:    Lorine Bears, NP  Nicolasa Ducking, PA-C  Eula Listen,  PA-C  Marisue Ivan, NP  Other Instructions  Recommend calling your primary care provider to schedule a follow up.

## 2019-12-14 NOTE — Progress Notes (Signed)
Office Visit    Patient Name: Shawn Sherman Date of Encounter: 12/14/2019  Primary Care Provider:  Erasmo Downer, MD Primary Cardiologist:  Lorine Bears, MD Electrophysiologist:  None   Chief Complaint    Shawn Sherman is a 64 y.o. male with a hx of HTN, HLD, tobacco use, carotid artery disease s/p bilateral carotid endarterectomies presents today for follow up of HTN.   Past Medical History    Past Medical History:  Diagnosis Date  . Anginal pain ([facility]) 08/2018   a. 08/2018 - atyp c/p and neg trop; b. 08/2018 MV: EF 57%, inferolateral horizontal ST depression w/o ischemia or infarct.  . Arthritis   . Carotid arterial disease ([facility])    a. 01/2019 Carotid U/S: RICA 80-90, RCCA <50, RECA>50, LICA 80-99, LCCA>50, LECA>50; b. 01/2019 CT Head/Neck: RICA string sign in bulb - lumen <40mm. Sev prox RECA dzs. LICA 60 @ dist bulb. R vert 50-70, L vert 30-50.  . Carotid bruit present 08/2018   left  . Chewing tobacco nicotine dependence   . Essential hypertension 03/29/2017  . GERD (gastroesophageal reflux disease)   . History of kidney stones   . Hyperlipidemia    Past Surgical History:  Procedure Laterality Date  . ANTERIOR LAT LUMBAR FUSION Right 01/01/2013   Procedure: lumbar three-four extreme lateral interbody fusion;  Surgeon: Reinaldo Meeker, MD;  Location: [facility];  Service: Neurosurgery;  Laterality: Right;  Lumbar three-four extreme lateral interbody fusion    . BACK SURGERY  08  . COLONOSCOPY    . ENDARTERECTOMY Right 02/18/2019   Procedure: ENDARTERECTOMY CAROTID;  Surgeon: Annice Needy, MD;  Location: [facility];  Service: Vascular;  Laterality: Right;  . ENDARTERECTOMY Left 06/11/2019   Procedure: ENDARTERECTOMY CAROTID;  Surgeon: Annice Needy, MD;  Location: [facility];  Service: Vascular;  Laterality: Left;  . ESOPHAGOGASTRODUODENOSCOPY (EGD) WITH PROPOFOL N/A 07/05/2015   Procedure: ESOPHAGOGASTRODUODENOSCOPY (EGD) WITH PROPOFOL;  Surgeon: Midge Minium, MD;   Location: [facility];  Service: Endoscopy;  Laterality: N/A;  . EXTRACORPOREAL SHOCK WAVE LITHOTRIPSY Right 09/11/2018   Procedure: EXTRACORPOREAL SHOCK WAVE LITHOTRIPSY (ESWL);  Surgeon: Riki Altes, MD;  Location: [facility];  Service: Urology;  Laterality: Right;  . LUMBAR PERCUTANEOUS PEDICLE SCREW 1 LEVEL N/A 01/01/2013   Procedure: lumbar three-four percutaneous pedicle screws ;  Surgeon: Reinaldo Meeker, MD;  Location: [facility];  Service: Neurosurgery;  Laterality: N/A;  lumbar three-four percutaneous pedicle screws    Allergies  No Known Allergies  History of Present Illness    Shawn Sherman is a 64 y.o. male with a hx of HTN, HLD, tobacco use, carotid artery disease s/p bilateral carotid endarterectomies. He was last seen 10/03/18 by Dr. Kirke Corin.  Seen 08/2018 for chest pain and noted to have left carotid bruit. Treadmill myoview with ST depression with exercise but reports on symptoms and perfusion imaging was normal.   He underwent bilateral carotid endarterectomies 01/2019 with Dr. Wyn Quaker of [facility] for severe bilateral carotid stenosis.   Clinic visit dates (all providers) and BP:   09/15/19 - 133/80  07/13/19 - 133/66  Tells me he has been out of his amlodipine for 6 months.  Is overdue for follow-up with his primary care provider.  Does not monitor blood pressure at home.  Has been working with PT for his left shoulder.  Blood pressure has been elevated at therapy.  Presently having difficulties with insurance coverage for PT but is hopeful to  resume.  At this point no plans for surgery.  Works as a Librarian, academic for [employer].  Very active job.  Did run an empty cart into his right calf the other week and has bruising to his RLE that is improving.  Reports no shortness of breath nor dyspnea on exertion. Reports no chest pain, pressure, or tightness. No edema, orthopnea, PND. Reports no palpitations.   EKGs/Labs/Other Studies Reviewed:   The following studies  were reviewed today:  Carotid duplex 09/15/19 Summary:  Right Carotid: The ECA appears >50% stenosed.   Left Carotid: The ECA appears >50% stenosed.   Vertebrals:  Bilateral vertebral arteries demonstrate antegrade flow.  Subclavians: Normal flow hemodynamics were seen in bilateral subclavian               arteries.    EKG:  EKG is ordered today.  The ekg ordered today demonstrates NSR 85 bpm.   Recent Labs: 06/12/2019: BUN 17; Creatinine, Ser 0.94; Hemoglobin 12.7; Platelets 175; Potassium 4.4; Sodium 135  Recent Lipid Panel    Component Value Date/Time   CHOL 143 04/19/2017 1143   CHOL 289 (H) 03/07/2017 0945   TRIG 88 04/19/2017 1143   HDL 53 04/19/2017 1143   HDL 46 03/07/2017 0945   CHOLHDL 2.7 04/19/2017 1143   LDLCALC 73 04/19/2017 1143   Home Medications   Current Meds  Medication Sig  . amLODipine (NORVASC) 10 MG tablet Take 1 tablet (10 mg total) by mouth daily. Please schedule office visit before any future refills  . aspirin 81 MG chewable tablet Chew 81 mg by mouth daily.  Marland Kitchen atorvastatin (LIPITOR) 80 MG tablet Take 1 tablet (80 mg total) by mouth daily.  . clopidogrel (PLAVIX) 75 MG tablet Take 1 tablet (75 mg total) by mouth daily with breakfast.  . esomeprazole (NEXIUM) 40 MG capsule Take 1 capsule (40 mg total) by mouth daily. Please schedule office visit before any future refills.  . naproxen sodium (ALEVE) 220 MG tablet Take 440 mg by mouth daily as needed.  . vitamin B-12 (CYANOCOBALAMIN) 500 MCG tablet Take 500 mcg by mouth daily.    Review of Systems    Review of Systems  Constitution: Negative for chills, fever and malaise/fatigue.  Cardiovascular: Negative for chest pain, dyspnea on exertion, irregular heartbeat, leg swelling, near-syncope, orthopnea, palpitations and syncope.  Respiratory: Negative for cough, shortness of breath and wheezing.   Musculoskeletal: Positive for joint pain (left shoulder).  Gastrointestinal: Negative for melena, nausea  and vomiting.  Genitourinary: Negative for hematuria.  Neurological: Negative for dizziness, light-headedness and weakness.   All other systems reviewed and are otherwise negative except as noted above.  Physical Exam    VS:  BP (!) 142/84 (BP Location: Left Arm, Patient Position: Sitting, Cuff Size: Normal)   Pulse 85   Ht 5\' 10"  (1.778 m)   Wt 177 lb 4 oz (80.4 kg)   SpO2 97%   BMI 25.43 kg/m  , BMI Body mass index is 25.43 kg/m. GEN: Well nourished, well developed, in no acute distress. HEENT: normal. Neck: Supple, no JVD, carotid bruits, or masses. Cardiac: RRR, no murmurs, rubs, or gallops. No clubbing, cyanosis, edema.  Radials/DP/PT 2+ and equal bilaterally.  Respiratory:  Respirations regular and unlabored, clear to auscultation bilaterally. GI: Soft, nontender, nondistended,. MS: No deformity or atrophy. Skin: Warm and dry, no rash.  Right shin with healing abrasion that is clean, dry, intact.  Noted yellowed ecchymosis to right calf and shin.  No  erythema nor swelling. Neuro:  Strength and sensation are intact. Psych: Normal affect.  Assessment & Plan    1. HTN -blood pressure elevated.  Out of amlodipine for 6 months.  Resume amlodipine 10 mg daily. 2. HLD - Continue Atorvastatin 80mg  daily. Lipid panel, CMP today.  LDL goal less than 70 in the setting of bilateral carotid stenosis.  If LDL not at goal, consider addition of Zetia 10 mg daily. 3. Tobacco use - Continues to chew.  Cessation encouraged. 4. Bilateral carotid artery stenosis s/p bilateral carotid endarterectomy - Continue to follow with [facility].  Plan for upcoming duplex in September.  Continue aspirin, Plavix.  Disposition: Follow up in 3 month(s) with Dr. October, NP 12/14/2019, 12:07 PM

## 2019-12-15 ENCOUNTER — Telehealth: Payer: Self-pay

## 2019-12-15 DIAGNOSIS — E782 Mixed hyperlipidemia: Secondary | ICD-10-CM

## 2019-12-15 LAB — COMPREHENSIVE METABOLIC PANEL
ALT: 21 IU/L (ref 0–44)
AST: 19 IU/L (ref 0–40)
Albumin/Globulin Ratio: 2.3 — ABNORMAL HIGH (ref 1.2–2.2)
Albumin: 4.1 g/dL (ref 3.8–4.8)
Alkaline Phosphatase: 124 IU/L — ABNORMAL HIGH (ref 48–121)
BUN/Creatinine Ratio: 17 (ref 10–24)
BUN: 13 mg/dL (ref 8–27)
Bilirubin Total: 0.5 mg/dL (ref 0.0–1.2)
CO2: 17 mmol/L — ABNORMAL LOW (ref 20–29)
Calcium: 9 mg/dL (ref 8.6–10.2)
Chloride: 106 mmol/L (ref 96–106)
Creatinine, Ser: 0.78 mg/dL (ref 0.76–1.27)
GFR calc Af Amer: 111 mL/min/{1.73_m2} (ref >59)
GFR calc non Af Amer: 96 mL/min/{1.73_m2} (ref >59)
Globulin, Total: 1.8 g/dL (ref 1.5–4.5)
Glucose: 132 mg/dL — ABNORMAL HIGH (ref 65–99)
Potassium: 5 mmol/L (ref 3.5–5.2)
Sodium: 143 mmol/L (ref 134–144)
Total Protein: 5.9 g/dL — ABNORMAL LOW (ref 6.0–8.5)

## 2019-12-15 LAB — CBC
Hematocrit: 45.7 % (ref 37.5–51.0)
Hemoglobin: 15.6 g/dL (ref 13.0–17.7)
MCH: 28.2 pg (ref 26.6–33.0)
MCHC: 34.1 g/dL (ref 31.5–35.7)
MCV: 83 fL (ref 79–97)
Platelets: 187 10*3/uL (ref 150–450)
RBC: 5.54 x10E6/uL (ref 4.14–5.80)
RDW: 13.4 % (ref 11.6–15.4)
WBC: 7 10*3/uL (ref 3.4–10.8)

## 2019-12-15 LAB — LIPID PANEL
Chol/HDL Ratio: 3.2 ratio (ref 0.0–5.0)
Cholesterol, Total: 163 mg/dL (ref 100–199)
HDL: 51 mg/dL (ref >39)
LDL Chol Calc (NIH): 96 mg/dL (ref 0–99)
Triglycerides: 87 mg/dL (ref 0–149)
VLDL Cholesterol Cal: 16 mg/dL (ref 5–40)

## 2019-12-15 NOTE — Telephone Encounter (Signed)
Call attempted. Voice mail box full. No message left.

## 2019-12-15 NOTE — Telephone Encounter (Signed)
-----  Message from Loel Dubonnet, NP sent at 12/15/2019  7:48 AM EDT ----- Normal kidney function and electrolytes. Liver function stable with mildly elevated alk phos and normal ALT/AST - recommend avoiding Acetaminophen (tylenol), fried foods, alcohol. CBC normal. LDL or "bad cholesterol" is 96 - goal of less than 70 - recommend addition of Zetia '10mg'$  daily and repeat lipid/liver in 6-8 weeks.

## 2019-12-22 MED ORDER — EZETIMIBE 10 MG PO TABS: 10.0000 mg | ORAL_TABLET | Freq: Every day | ORAL | 3 refills | Status: DC

## 2019-12-22 NOTE — Telephone Encounter (Signed)
Attempted to call the patient. No answer- voice mail box is currently full.

## 2019-12-22 NOTE — Telephone Encounter (Signed)
I spoke with the patient. He advised that he did not understand if he was supposed to take the zetia along with the atorvastatin.  I advised this will be taken in addition to his atorvastatin to help drive down his LDL.  He was asking about his labs in 6-8 weeks. I asked that he mark on his calendar that he needs to have these done in 6-8 weeks. He will come [facility]: Monday - Friday (7:30 am- 5:30 pm)- 1st desk on the right to check in. Advised he will need to be fasting for 8 hours prior.   The patient voiced understanding and was agreeable.

## 2019-12-22 NOTE — Telephone Encounter (Signed)
Patient states he was partially awake when going over results.    Please advise if zetia is to be taken with Lipitor or if Lipitor is dc .

## 2019-12-22 NOTE — Telephone Encounter (Signed)
Call to patient to review labs.  °  °Pt verbalized understanding and has no further questions at this time.  °  °Advised pt to call for any further questions or concerns.  °Orders updated as advised.  °

## 2019-12-31 ENCOUNTER — Other Ambulatory Visit: Payer: Self-pay

## 2019-12-31 ENCOUNTER — Ambulatory Visit (INDEPENDENT_AMBULATORY_CARE_PROVIDER_SITE_OTHER): Payer: 59 | Admitting: Family Medicine

## 2019-12-31 ENCOUNTER — Encounter: Payer: Self-pay | Admitting: Family Medicine

## 2019-12-31 VITALS — BP 116/70 | HR 76 | Temp 97.1°F | Resp 16 | Ht 70.0 in | Wt 173.6 lb

## 2019-12-31 DIAGNOSIS — R7303 Prediabetes: Secondary | ICD-10-CM | POA: Diagnosis not present

## 2019-12-31 DIAGNOSIS — M7061 Trochanteric bursitis, right hip: Secondary | ICD-10-CM

## 2019-12-31 DIAGNOSIS — Z1211 Encounter for screening for malignant neoplasm of colon: Secondary | ICD-10-CM | POA: Diagnosis not present

## 2019-12-31 DIAGNOSIS — E782 Mixed hyperlipidemia: Secondary | ICD-10-CM

## 2019-12-31 DIAGNOSIS — E119 Type 2 diabetes mellitus without complications: Secondary | ICD-10-CM | POA: Insufficient documentation

## 2019-12-31 DIAGNOSIS — I10 Essential (primary) hypertension: Secondary | ICD-10-CM

## 2019-12-31 LAB — POCT GLYCOSYLATED HEMOGLOBIN (HGB A1C)
Est. average glucose Bld gHb Est-mCnc: 134
Hemoglobin A1C: 6.2 % — AB (ref 4.0–5.6)

## 2019-12-31 MED ORDER — METHYLPREDNISOLONE ACETATE 40 MG/ML IJ SUSP
40.0000 mg | Freq: Once | INTRAMUSCULAR | Status: AC
  Administered 2019-12-31: 40 mg via INTRAMUSCULAR

## 2019-12-31 MED ORDER — LIDOCAINE HCL (PF) 1 % IJ SOLN
2.0000 mL | Freq: Once | INTRAMUSCULAR | Status: AC
  Administered 2019-12-31: 2 mL via INTRADERMAL

## 2019-12-31 NOTE — Assessment & Plan Note (Signed)
Well controlled Continue current medications Reviewed recent metabolic panel F/u in 6 months  

## 2019-12-31 NOTE — Assessment & Plan Note (Signed)
New problem Has tried OTC NSAIDs and topicals without relief Home exercise program given Advised on conservative therapy and corticosteroid injections and patient decides to proceed with steroid injection today See procedure note below Return precautions discussed

## 2019-12-31 NOTE — Assessment & Plan Note (Signed)
New problem Does not seem to have had an A1c checked previously, but glucose was consistently elevated on last labs A1c today is elevated in the prediabetic range Discussed importance of low-carb diet and exercise Continue to monitor every 6 months

## 2019-12-31 NOTE — Progress Notes (Signed)
Established patient visit   Patient: Shawn Sherman   DOB: 09/21/1955   64 y.o. Male  MRN: [id number] Visit Date: 12/31/2019  I,Sulibeya S Dimas,acting as a scribe for Shirlee Latch, MD.,have documented all relevant documentation on the behalf of Shirlee Latch, MD,as directed by  Shirlee Latch, MD while in the presence of Shirlee Latch, MD.  Today's healthcare provider: Shirlee Latch, MD   Chief Complaint  Patient presents with  . Hypertension  . Hyperlipidemia  . Leg Pain   Subjective    HPI Hypertension, follow-up  BP Readings from Last 3 Encounters:  12/31/19 116/70  12/14/19 (!) 142/84  09/15/19 133/80   Wt Readings from Last 3 Encounters:  12/31/19 173 lb 9.6 oz (78.7 kg)  12/14/19 177 lb 4 oz (80.4 kg)  09/15/19 179 lb (81.2 kg)     He was last seen for hypertension 04/19/2017 (seen by Anola Gurney, PA-C). BP at that visit was 130/80. Management since that visit includes continuing Amlodipine.  He reports excellent compliance with treatment. He is not having side effects. He is following a Low Sodium diet. He is not exercising. He does chew tobacco.  Use of agents associated with hypertension: none.   Outside blood pressures are not being checked. Symptoms: No chest pain No chest pressure  No palpitations No syncope  No dyspnea No orthopnea  No paroxysmal nocturnal dyspnea Yes lower extremity edema   Pertinent labs: Lab Results  Component Value Date   CHOL 163 12/14/2019   HDL 51 12/14/2019   LDLCALC 96 12/14/2019   TRIG 87 12/14/2019   CHOLHDL 3.2 12/14/2019   Lab Results  Component Value Date   NA 143 12/14/2019   K 5.0 12/14/2019   CREATININE 0.78 12/14/2019   GFRNONAA 96 12/14/2019   GFRAA 111 12/14/2019   GLUCOSE 132 (H) 12/14/2019     The 10-year ASCVD risk score Denman George DC Jr., et al., 2013) is: 9.1%    --------------------------------------------------------------------------------------------------- Lipid/Cholesterol, Follow-up  Last lipid panel Other pertinent labs  Lab Results  Component Value Date   CHOL 163 12/14/2019   HDL 51 12/14/2019   LDLCALC 96 12/14/2019   TRIG 87 12/14/2019   CHOLHDL 3.2 12/14/2019   Lab Results  Component Value Date   ALT 21 12/14/2019   AST 19 12/14/2019   PLT 187 12/14/2019     He was last seen for this 04/19/2017 (seen by Anola Gurney, PA-C). Management since that visit includes continuing same dose of Atorvastatin.  He reports excellent compliance with treatment. He is not having side effects.   Symptoms: No chest pain No chest pressure/discomfort  No dyspnea Yes lower extremity edema  No numbness or tingling of extremity No orthopnea  No palpitations No paroxysmal nocturnal dyspnea  No speech difficulty No syncope   Current diet: in general, a "healthy" diet   Current exercise: no regular exercise and walking  The 10-year ASCVD risk score Denman George DC Jr., et al., 2013) is: 9.1%  ---------------------------------------------------------------------------------------------------  Patient C/O right hip and leg pain x two months. Patient denies any injuries, reports pain day and night. Patient reports he has used OTC Voltaren gel and Aspercreme, reports mild pain control. Reports he has been taking Ibuprofen two times a day. Patient reports pain is worse getting up from sitting position and from standing for a long period of time. Patient reports pain is worse also when he lays on his right side.  No weakness, numbness, back pain.   Patient Active  Problem List   Diagnosis Date Noted  . Pre-diabetes 12/31/2019  . Greater trochanteric bursitis of right hip 12/31/2019  . Carotid stenosis, left 06/11/2019  . Carotid stenosis, right 02/18/2019  . Carotid stenosis 01/30/2019  . Nephrolithiasis 12/29/2018  . Essential hypertension  03/29/2017  . History of hay fever 06/17/2015  . Acid reflux 06/17/2015  . Hyperlipidemia 06/17/2015   Social History   Tobacco Use  . Smoking status: Never Smoker  . Smokeless tobacco: Current User    Types: Snuff  Vaping Use  . Vaping Use: Never used  Substance Use Topics  . Alcohol use: No  . Drug use: No       Medications: Outpatient Medications Prior to Visit  Medication Sig  . amLODipine (NORVASC) 10 MG tablet Take 1 tablet (10 mg total) by mouth daily.  Marland Kitchen aspirin 81 MG chewable tablet Chew 81 mg by mouth daily.  Marland Kitchen atorvastatin (LIPITOR) 80 MG tablet Take 1 tablet (80 mg total) by mouth daily.  . clopidogrel (PLAVIX) 75 MG tablet Take 1 tablet (75 mg total) by mouth daily with breakfast.  . esomeprazole (NEXIUM) 40 MG capsule Take 1 capsule (40 mg total) by mouth daily. Please schedule office visit before any future refills.  . ezetimibe (ZETIA) 10 MG tablet Take 1 tablet (10 mg total) by mouth daily.  . naproxen sodium (ALEVE) 220 MG tablet Take 440 mg by mouth daily as needed.  . vitamin B-12 (CYANOCOBALAMIN) 500 MCG tablet Take 500 mcg by mouth daily.   No facility-administered medications prior to visit.    Review of Systems  Constitutional: Negative for appetite change, chills and fever.  Respiratory: Negative for chest tightness, shortness of breath and wheezing.   Cardiovascular: Positive for leg swelling. Negative for chest pain and palpitations.  Gastrointestinal: Negative for abdominal pain, nausea and vomiting.  Musculoskeletal: Positive for myalgias.      Objective    BP 116/70 (BP Location: Right Arm, Patient Position: Sitting, Cuff Size: Normal)   Pulse 76   Temp (!) 97.1 F (36.2 C) (Temporal)   Resp 16   Ht 5\' 10"  (1.778 m)   Wt 173 lb 9.6 oz (78.7 kg)   SpO2 99%   BMI 24.91 kg/m  BP Readings from Last 3 Encounters:  12/31/19 116/70  12/14/19 (!) 142/84  09/15/19 133/80   Wt Readings from Last 3 Encounters:  12/31/19 173 lb 9.6 oz  (78.7 kg)  12/14/19 177 lb 4 oz (80.4 kg)  09/15/19 179 lb (81.2 kg)      Physical Exam Constitutional:      General: He is not in acute distress.    Appearance: Normal appearance. He is not diaphoretic.  HENT:     Head: Normocephalic and atraumatic.  Cardiovascular:     Rate and Rhythm: Normal rate and regular rhythm.     Pulses: Normal pulses.     Heart sounds: Normal heart sounds. No murmur heard.   Pulmonary:     Effort: Pulmonary effort is normal.     Breath sounds: Normal breath sounds.  Musculoskeletal:     Right lower leg: No edema.     Left lower leg: No edema.     Comments: R hip: Normal range of motion and normal strength.  Tenderness to palpation over greater trochanteric bursa  Skin:    General: Skin is warm and dry.     Findings: No rash.  Neurological:     Mental Status: He is alert and oriented to person,  place, and time. Mental status is at baseline.     Sensory: No sensory deficit.     Motor: No weakness.     Gait: Gait abnormal (antalgic).  Psychiatric:        Mood and Affect: Mood normal.        Behavior: Behavior normal.     No results found for any visits on 12/31/19.  Assessment & Plan     Problem List Items Addressed This Visit      Cardiovascular and Mediastinum   Essential hypertension - Primary    Well controlled Continue current medications Reviewed recent metabolic panel F/u in 6 months         Musculoskeletal and Integument   Greater trochanteric bursitis of right hip    New problem Has tried OTC NSAIDs and topicals without relief Home exercise program given Advised on conservative therapy and corticosteroid injections and patient decides to proceed with steroid injection today See procedure note below Return precautions discussed      Relevant Medications   methylPREDNISolone acetate (DEPO-MEDROL) injection 40 mg (Start on 12/31/2019  1:15 PM)   lidocaine (PF) (XYLOCAINE) 1 % injection 2 mL (Start on 12/31/2019  1:15 PM)    lidocaine (PF) (XYLOCAINE) 1 % injection 2 mL (Start on 12/31/2019  1:15 PM)     Other   Hyperlipidemia    Well-controlled on recent lipid panel, which was reviewed Continue statin Also followed by vascular surgery and cardiology       Pre-diabetes    New problem Does not seem to have had an A1c checked previously, but glucose was consistently elevated on last labs A1c today is elevated in the prediabetic range Discussed importance of low-carb diet and exercise Continue to monitor every 6 months      Relevant Orders   POCT glycosylated hemoglobin (Hb A1C)    Other Visit Diagnoses    Colon cancer screening       Relevant Orders   Ambulatory referral to Gastroenterology      INJECTION: Patient was given informed consent,. Appropriate time out was taken. Area prepped and draped in usual sterile fashion.  1 cc of depo-medrol 40 mg/ml plus 4 cc of 1% lidocaine without epinephrine was injected into the right greater trochanteric bursa using a(n) lateral approach. The patient tolerated the procedure well. There were no complications. Post procedure instructions were given.   Return in about 6 months (around 07/01/2020) for CPE.      I, Shirlee Latch, MD, have reviewed all documentation for this visit. The documentation on 12/31/19 for the exam, diagnosis, procedures, and orders are all accurate and complete.   Marcos Ruelas, Marzella Schlein, MD, MPH [facility]

## 2019-12-31 NOTE — Assessment & Plan Note (Signed)
Well-controlled on recent lipid panel, which was reviewed Continue statin Also followed by vascular surgery and cardiology

## 2020-01-07 ENCOUNTER — Telehealth: Payer: 59

## 2020-01-13 ENCOUNTER — Telehealth: Payer: Self-pay | Admitting: Family Medicine

## 2020-01-13 DIAGNOSIS — M7061 Trochanteric bursitis, right hip: Secondary | ICD-10-CM

## 2020-01-13 MED ORDER — GABAPENTIN 100 MG PO CAPS: 100.0000 mg | ORAL_CAPSULE | Freq: Three times a day (TID) | ORAL | 3 refills | Status: DC

## 2020-01-13 NOTE — Telephone Encounter (Signed)
Pt was given a shot in his right leg but it is still bothering him and still painful/ pt would like a pain medication called into pharmacy asap or to be seen asap/ pt stated Friday was a long time to wait/ Please advise

## 2020-01-13 NOTE — Telephone Encounter (Signed)
Gabapentin sent in.

## 2020-01-13 NOTE — Telephone Encounter (Signed)
Patient advised.

## 2020-01-21 ENCOUNTER — Other Ambulatory Visit: Payer: Self-pay | Admitting: Family Medicine

## 2020-01-21 DIAGNOSIS — K209 Esophagitis, unspecified without bleeding: Secondary | ICD-10-CM

## 2020-01-26 ENCOUNTER — Other Ambulatory Visit (INDEPENDENT_AMBULATORY_CARE_PROVIDER_SITE_OTHER): Payer: Self-pay | Admitting: Vascular Surgery

## 2020-03-09 NOTE — Progress Notes (Deleted)
Cardiology Office Note    Date:  03/09/2020   ID:  Shawn Sherman, DOB [birth year], MRN [id number]  PCP:  Erasmo Downer, MD  Cardiologist:  Lorine Bears, MD  Electrophysiologist:  None   Chief Complaint: Follow up  History of Present Illness:   Shawn Sherman is a 64 y.o. male with history of severe bilateral carotid artery disease s/p staged bilateral CEA as detailed below, HTN, HLD, and chewing tobacco use who presents for ***  He was seen in 08/2018 for atypical chest pain and also noted to have a left carotid bruit. He underwent treadmill Myoview in 08/2018 that showed ST segment depression with exercise without patient symptoms. He had normal perfusion imaging. Carotid artery ultrasound in 01/2019 showed severe bilateral ICA stenosis measuring 80-99%. In this setting, he was seen by vascular surgery and underwent staged bilateral CEA with right-sided CEA in 02/2019 and left-sided CEA in 06/2019. Most recent carotid artery ultrasound from 09/15/2019 showed patent bilateral CEA without evidence of restenosis.  He was last seen in the office in 12/2019 for follow up of HTN. He reported elevated BP with PT while working on his shoulder and had been out of his amlodipine for several months. BP at that visit 142/84. Amlodipine was resumed at 10 mg daily. With his LDL being > 70 as outlined below, Zetia was recommended.   ***   Labs independently reviewed: 12/2019 - A1c 6.2, HGB 15.6, PLT 187, TC 163, TG 87, HDL 51, LDL 96, BUN 13, SCr 0.78, potassium 5.0, albumin 4.1, AST/ALT normal  Past Medical History:  Diagnosis Date  . Anginal pain (HCC) 08/2018   a. 08/2018 - atyp c/p and neg trop; b. 08/2018 MV: EF 57%, inferolateral horizontal ST depression w/o ischemia or infarct.  . Arthritis   . Carotid arterial disease (HCC)    a. 01/2019 Carotid U/S: RICA 80-90, RCCA <50, RECA>50, LICA 80-99, LCCA>50, LECA>50; b. 01/2019 CT Head/Neck: RICA string sign in bulb - lumen <41mm. Sev prox RECA dzs. LICA  60 @ dist bulb. R vert 50-70, L vert 30-50.  . Carotid bruit present 08/2018   left  . Chewing tobacco nicotine dependence   . Essential hypertension 03/29/2017  . GERD (gastroesophageal reflux disease)   . History of kidney stones   . Hyperlipidemia     Past Surgical History:  Procedure Laterality Date  . ANTERIOR LAT LUMBAR FUSION Right 01/01/2013   Procedure: lumbar three-four extreme lateral interbody fusion;  Surgeon: Reinaldo Meeker, MD;  Location: [facility];  Service: Neurosurgery;  Laterality: Right;  Lumbar three-four extreme lateral interbody fusion    . BACK SURGERY  08  . COLONOSCOPY    . ENDARTERECTOMY Right 02/18/2019   Procedure: ENDARTERECTOMY CAROTID;  Surgeon: Annice Needy, MD;  Location: [facility];  Service: Vascular;  Laterality: Right;  . ENDARTERECTOMY Left 06/11/2019   Procedure: ENDARTERECTOMY CAROTID;  Surgeon: Annice Needy, MD;  Location: [facility];  Service: Vascular;  Laterality: Left;  . ESOPHAGOGASTRODUODENOSCOPY (EGD) WITH PROPOFOL N/A 07/05/2015   Procedure: ESOPHAGOGASTRODUODENOSCOPY (EGD) WITH PROPOFOL;  Surgeon: Midge Minium, MD;  Location: [facility];  Service: Endoscopy;  Laterality: N/A;  . EXTRACORPOREAL SHOCK WAVE LITHOTRIPSY Right 09/11/2018   Procedure: EXTRACORPOREAL SHOCK WAVE LITHOTRIPSY (ESWL);  Surgeon: Riki Altes, MD;  Location: [facility];  Service: Urology;  Laterality: Right;  . LUMBAR PERCUTANEOUS PEDICLE SCREW 1 LEVEL N/A 01/01/2013   Procedure: lumbar three-four percutaneous pedicle screws ;  Surgeon: Rolanda Lundborg Kritzer,  MD;  Location: [facility];  Service: Neurosurgery;  Laterality: N/A;  lumbar three-four percutaneous pedicle screws    Current Medications: No outpatient medications have been marked as taking for the 03/15/20 encounter (Appointment) with Sondra Barges, PA-C.    Allergies:   Patient has no known allergies.   Social History   Socioeconomic History  . Marital status: Married    Spouse name: Joyce Gross  . Number of  children: Not on file  . Years of education: Not on file  . Highest education level: Not on file  Occupational History  . Not on file  Tobacco Use  . Smoking status: Never Smoker  . Smokeless tobacco: Current User    Types: Snuff  Vaping Use  . Vaping Use: Never used  Substance and Sexual Activity  . Alcohol use: No  . Drug use: No  . Sexual activity: Yes    Birth control/protection: None  Other Topics Concern  . Not on file  Social History Narrative  . Not on file   Social Determinants of Health   Financial Resource Strain:   . Difficulty of Paying Living Expenses: Not on file  Food Insecurity:   . Worried About Programme researcher, broadcasting/film/video in the Last Year: Not on file  . Ran Out of Food in the Last Year: Not on file  Transportation Needs:   . Lack of Transportation (Medical): Not on file  . Lack of Transportation (Non-Medical): Not on file  Physical Activity:   . Days of Exercise per Week: Not on file  . Minutes of Exercise per Session: Not on file  Stress:   . Feeling of Stress : Not on file  Social Connections:   . Frequency of Communication with Friends and Family: Not on file  . Frequency of Social Gatherings with Friends and Family: Not on file  . Attends Religious Services: Not on file  . Active Member of Clubs or Organizations: Not on file  . Attends Banker Meetings: Not on file  . Marital Status: Not on file     Family History:  The patient's family history includes COPD in his sister; Cancer in his father and mother.  ROS:   ROS   EKGs/Labs/Other Studies Reviewed:    Studies reviewed were summarized above. The additional studies were reviewed today: As above.   EKG:  EKG is ordered today.  The EKG ordered today demonstrates ***  Recent Labs: 12/14/2019: ALT 21; BUN 13; Creatinine, Ser 0.78; Hemoglobin 15.6; Platelets 187; Potassium 5.0; Sodium 143  Recent Lipid Panel    Component Value Date/Time   CHOL 163 12/14/2019 1214   TRIG 87  12/14/2019 1214   HDL 51 12/14/2019 1214   CHOLHDL 3.2 12/14/2019 1214   CHOLHDL 2.7 04/19/2017 1143   LDLCALC 96 12/14/2019 1214   LDLCALC 73 04/19/2017 1143    PHYSICAL EXAM:    VS:  There were no vitals taken for this visit.  BMI: There is no height or weight on file to calculate BMI.  Physical Exam  Wt Readings from Last 3 Encounters:  12/31/19 173 lb 9.6 oz (78.7 kg)  12/14/19 177 lb 4 oz (80.4 kg)  09/15/19 179 lb (81.2 kg)     ASSESSMENT & PLAN:   1. Bilateral carotid artery disease: Status post staged bilateral CEA.  2. HTN: Blood pressure ***  3. HLD: LDL 96 from 12/2019 with normal LFT at that time.   4. Tobacco use:   Disposition: F/u with  Dr. Kirke Corin or an APP in ***.   Medication Adjustments/Labs and Tests Ordered: Current medicines are reviewed at length with the patient today.  Concerns regarding medicines are outlined above. Medication changes, Labs and Tests ordered today are summarized above and listed in the Patient Instructions accessible in Encounters.   Signed, Eula Listen, PA-C 03/09/2020 2:21 PM     [facility] [place] [address] [place], [place] [address] [phone]

## 2020-03-15 ENCOUNTER — Ambulatory Visit: Payer: 59 | Admitting: Physician Assistant

## 2020-03-22 ENCOUNTER — Encounter (INDEPENDENT_AMBULATORY_CARE_PROVIDER_SITE_OTHER): Payer: Self-pay | Admitting: Vascular Surgery

## 2020-03-22 ENCOUNTER — Ambulatory Visit (INDEPENDENT_AMBULATORY_CARE_PROVIDER_SITE_OTHER): Payer: 59

## 2020-03-22 ENCOUNTER — Other Ambulatory Visit: Payer: Self-pay

## 2020-03-22 ENCOUNTER — Ambulatory Visit (INDEPENDENT_AMBULATORY_CARE_PROVIDER_SITE_OTHER): Payer: 59 | Admitting: Vascular Surgery

## 2020-03-22 VITALS — BP 146/79 | HR 78 | Ht 70.0 in | Wt 175.0 lb

## 2020-03-22 DIAGNOSIS — I10 Essential (primary) hypertension: Secondary | ICD-10-CM | POA: Diagnosis not present

## 2020-03-22 DIAGNOSIS — I6523 Occlusion and stenosis of bilateral carotid arteries: Secondary | ICD-10-CM

## 2020-03-22 DIAGNOSIS — E782 Mixed hyperlipidemia: Secondary | ICD-10-CM | POA: Diagnosis not present

## 2020-03-22 NOTE — Progress Notes (Signed)
MRN : [id number]  Shawn Sherman is a 64 y.o. ([birth year]) male who presents with chief complaint of  Chief Complaint  Patient presents with  . Follow-up    6 mo follow   .  History of Present Illness: Patient returns in follow-up of his carotid disease.  He is doing well and has no complaints today.  He had bilateral carotid endarterectomies performed in a staged fashion last year.  He denies any focal neurologic symptoms. Specifically, the patient denies amaurosis fugax, speech or swallowing difficulties, or arm or leg weakness or numbness.  Carotid duplex today reveals bilateral carotid endarterectomies to be widely patent without recurrent stenosis.  Current Outpatient Medications  Medication Sig Dispense Refill  . amLODipine (NORVASC) 10 MG tablet Take 1 tablet (10 mg total) by mouth daily. 90 tablet 1  . atorvastatin (LIPITOR) 80 MG tablet Take 1 tablet (80 mg total) by mouth daily. 90 tablet 3  . clopidogrel (PLAVIX) 75 MG tablet TAKE 1 TABLET BY MOUTH ONCE DAILY WITH BREAKFAST 90 tablet 3  . cyclobenzaprine (FLEXERIL) 5 MG tablet Take 5 mg by mouth at bedtime as needed.    Marland Kitchen esomeprazole (NEXIUM) 40 MG capsule TAKE 1 CAPSULE BY MOUTH ONCE EVERY EVENING 90 capsule 0  . gabapentin (NEURONTIN) 100 MG capsule Take 1 capsule (100 mg total) by mouth 3 (three) times daily. 90 capsule 3  . naproxen sodium (ALEVE) 220 MG tablet Take 440 mg by mouth daily as needed.    Marland Kitchen aspirin 81 MG chewable tablet Chew 81 mg by mouth daily. (Patient not taking: Reported on 03/22/2020)    . ezetimibe (ZETIA) 10 MG tablet Take 1 tablet (10 mg total) by mouth daily. 90 tablet 3  . vitamin B-12 (CYANOCOBALAMIN) 500 MCG tablet Take 500 mcg by mouth daily. (Patient not taking: Reported on 03/22/2020)     No current facility-administered medications for this visit.    Past Medical History:  Diagnosis Date  . Anginal pain (HCC) 08/2018   a. 08/2018 - atyp c/p and neg trop; b. 08/2018 MV: EF 57%, inferolateral  horizontal ST depression w/o ischemia or infarct.  . Arthritis   . Carotid arterial disease (HCC)    a. 01/2019 Carotid U/S: RICA 80-90, RCCA <50, RECA>50, LICA 80-99, LCCA>50, LECA>50; b. 01/2019 CT Head/Neck: RICA string sign in bulb - lumen <1mm. Sev prox RECA dzs. LICA 60 @ dist bulb. R vert 50-70, L vert 30-50.  . Carotid bruit present 08/2018   left  . Chewing tobacco nicotine dependence   . Essential hypertension 03/29/2017  . GERD (gastroesophageal reflux disease)   . History of kidney stones   . Hyperlipidemia     Past Surgical History:  Procedure Laterality Date  . ANTERIOR LAT LUMBAR FUSION Right 01/01/2013   Procedure: lumbar three-four extreme lateral interbody fusion;  Surgeon: Reinaldo Meeker, MD;  Location: [facility];  Service: Neurosurgery;  Laterality: Right;  Lumbar three-four extreme lateral interbody fusion    . BACK SURGERY  08  . COLONOSCOPY    . ENDARTERECTOMY Right 02/18/2019   Procedure: ENDARTERECTOMY CAROTID;  Surgeon: Annice Needy, MD;  Location: [facility];  Service: Vascular;  Laterality: Right;  . ENDARTERECTOMY Left 06/11/2019   Procedure: ENDARTERECTOMY CAROTID;  Surgeon: Annice Needy, MD;  Location: [facility];  Service: Vascular;  Laterality: Left;  . ESOPHAGOGASTRODUODENOSCOPY (EGD) WITH PROPOFOL N/A 07/05/2015   Procedure: ESOPHAGOGASTRODUODENOSCOPY (EGD) WITH PROPOFOL;  Surgeon: Midge Minium, MD;  Location: [facility];  Service: Endoscopy;  Laterality: N/A;  . EXTRACORPOREAL SHOCK WAVE LITHOTRIPSY Right 09/11/2018   Procedure: EXTRACORPOREAL SHOCK WAVE LITHOTRIPSY (ESWL);  Surgeon: Riki Altes, MD;  Location: [facility];  Service: Urology;  Laterality: Right;  . LUMBAR PERCUTANEOUS PEDICLE SCREW 1 LEVEL N/A 01/01/2013   Procedure: lumbar three-four percutaneous pedicle screws ;  Surgeon: Reinaldo Meeker, MD;  Location: [facility];  Service: Neurosurgery;  Laterality: N/A;  lumbar three-four percutaneous pedicle screws     Social History   Tobacco  Use  . Smoking status: Never Smoker  . Smokeless tobacco: Current User    Types: Snuff  Vaping Use  . Vaping Use: Never used  Substance Use Topics  . Alcohol use: No  . Drug use: No     Family History  Problem Relation Age of Onset  . Cancer Mother   . Cancer Father   . COPD Sister     No Known Allergies   REVIEW OF SYSTEMS (Negative unless checked)  Constitutional: [] ?Weight loss  [] ?Fever  [] ?Chills Cardiac: [] ?Chest pain   [] ?Chest pressure   [] ?Palpitations   [] ?Shortness of breath when laying flat   [] ?Shortness of breath at rest   [] ?Shortness of breath with exertion. Vascular:  [] ?Pain in legs with walking   [] ?Pain in legs at rest   [] ?Pain in legs when laying flat   [] ?Claudication   [] ?Pain in feet when walking  [] ?Pain in feet at rest  [] ?Pain in feet when laying flat   [] ?History of DVT   [] ?Phlebitis   [] ?Swelling in legs   [] ?Varicose veins   [] ?Non-healing ulcers Pulmonary:   [] ?Uses home oxygen   [] ?Productive cough   [] ?Hemoptysis   [] ?Wheeze  [] ?COPD   [] ?Asthma Neurologic:  [x] ?Dizziness  [] ?Blackouts   [] ?Seizures   [] ?History of stroke   [] ?History of TIA  [] ?Aphasia   [] ?Temporary blindness   [] ?Dysphagia   [] ?Weakness or numbness in arms   [] ?Weakness or numbness in legs Musculoskeletal:  [x] ?Arthritis   [] ?Joint swelling   [] ?Joint pain   [] ?Low back pain Hematologic:  [] ?Easy bruising  [] ?Easy bleeding   [] ?Hypercoagulable state   [] ?Anemic  [] ?Hepatitis Gastrointestinal:  [] ?Blood in stool   [] ?Vomiting blood  [x] ?Gastroesophageal reflux/heartburn   [] ?Difficulty swallowing. Genitourinary:  [] ?Chronic kidney disease   [] ?Difficult urination  [] ?Frequent urination  [] ?Burning with urination   [] ?Blood in urine Skin:  [] ?Rashes   [] ?Ulcers   [] ?Wounds Psychological:  [] ?History of anxiety   [] ? History of major depression.  Physical Examination  Vitals:   03/22/20 1431  BP: (!) 146/79  Pulse: 78  Weight: 175 lb (79.4 kg)  Height: 5\' 10"  (1.778 m)    Body mass index is 25.11 kg/m. Gen:  WD/WN, NAD Head: [place]/AT, No temporalis wasting. Ear/Nose/Throat: Hearing grossly intact, nares w/o erythema or drainage, trachea midline Eyes: Conjunctiva clear. Sclera non-icteric Neck: Supple.  No bruit.  CEA incision is well-healed Pulmonary:  Good air movement, equal and clear to auscultation bilaterally.  Cardiac: RRR, No JVD Vascular:  Vessel Right Left  Radial Palpable Palpable               Musculoskeletal: M/S 5/5 throughout.  No deformity or atrophy. No edema. Neurologic: CN 2-12 intact. Sensation grossly intact in extremities.  Symmetrical.  Speech is fluent. Motor exam as listed above. Psychiatric: Judgment intact, Mood & affect appropriate for pt's clinical situation. Dermatologic: No rashes or ulcers noted.  No cellulitis or open wounds.     CBC Lab  Results  Component Value Date   WBC 7.0 12/14/2019   HGB 15.6 12/14/2019   HCT 45.7 12/14/2019   MCV 83 12/14/2019   PLT 187 12/14/2019    BMET    Component Value Date/Time   NA 143 12/14/2019 1214   K 5.0 12/14/2019 1214   CL 106 12/14/2019 1214   CO2 17 (L) 12/14/2019 1214   GLUCOSE 132 (H) 12/14/2019 1214   GLUCOSE 203 (H) 06/12/2019 0405   BUN 13 12/14/2019 1214   CREATININE 0.78 12/14/2019 1214   CALCIUM 9.0 12/14/2019 1214   GFRNONAA 96 12/14/2019 1214   GFRAA 111 12/14/2019 1214   CrCl cannot be calculated (Patient's most recent lab result is older than the maximum 21 days allowed.).  COAG Lab Results  Component Value Date   INR 1.0 06/02/2019   INR 1.0 02/13/2019    Radiology VAS US CAROTID  Result Date: 03/22/2020 Carotid Arterial Duplex Study Indications:   Bilateral endarterectomies. Other Factors: 02/18/2019: Right carotid endarterectomy;                06/11/19: Left carotid endarterectomy;. Performing Technologist: Salvadore Farber RVT  Examination Guidelines: A complete evaluation includes B-mode imaging, spectral Doppler, color Doppler, and power  Doppler as needed of all accessible portions of each vessel. Bilateral testing is considered an integral part of a complete examination. Limited examinations for reoccurring indications may be performed as noted.  Right Carotid Findings: +----------+--------+--------+--------+------------------+--------+           PSV cm/sEDV cm/sStenosisPlaque DescriptionComments +----------+--------+--------+--------+------------------+--------+ CCA Prox  97      15                                         +----------+--------+--------+--------+------------------+--------+ CCA Mid   90      21                                         +----------+--------+--------+--------+------------------+--------+ CCA Distal75      16                                         +----------+--------+--------+--------+------------------+--------+ ICA Prox  64      14      1-39%                              +----------+--------+--------+--------+------------------+--------+ ICA Mid   79      29                                         +----------+--------+--------+--------+------------------+--------+ ICA Distal83      30                                         +----------+--------+--------+--------+------------------+--------+ ECA       155     18                                         +----------+--------+--------+--------+------------------+--------+ +----------+--------+-------+----------------+-------------------+  PSV cm/sEDV cmsDescribe        Arm Pressure (mmHG) +----------+--------+-------+----------------+-------------------+ [id number]             Multiphasic, WNL                    +----------+--------+-------+----------------+-------------------+ +---------+--------+--+--------+---------+ VertebralPSV cm/s49EDV cm/sAntegrade +---------+--------+--+--------+---------+  Left Carotid Findings: +----------+--------+--------+--------+------------------+--------+            PSV cm/sEDV cm/sStenosisPlaque DescriptionComments +----------+--------+--------+--------+------------------+--------+ CCA Prox  108     19                                         +----------+--------+--------+--------+------------------+--------+ CCA Mid   85      19                                         +----------+--------+--------+--------+------------------+--------+ CCA Distal100     13                                         +----------+--------+--------+--------+------------------+--------+ ICA Prox  121     20      1-39%                              +----------+--------+--------+--------+------------------+--------+ ICA Mid   70      20                                         +----------+--------+--------+--------+------------------+--------+ ICA Distal77      25                                         +----------+--------+--------+--------+------------------+--------+ ECA       107     12                                         +----------+--------+--------+--------+------------------+--------+ +----------+--------+--------+----------------+-------------------+           PSV cm/sEDV cm/sDescribe        Arm Pressure (mmHG) +----------+--------+--------+----------------+-------------------+ [id number]              Multiphasic, WNL                    +----------+--------+--------+----------------+-------------------+ +---------+--------+--+--------+---------+ VertebralPSV cm/s42EDV cm/sAntegrade +---------+--------+--+--------+---------+   Summary: Right Carotid: Velocities in the right ICA are consistent with a 1-39% stenosis.                Non-hemodynamically significant plaque <50% noted in the CCA. The                ECA appears <50% stenosed. Widely patent ICA s/p CEA with no                evidence of restenosis. Left Carotid: Velocities in the left ICA are consistent with a 1-39% stenosis.  Non-hemodynamically significant plaque <50% noted in the CCA. The               ECA appears <50% stenosed. Widely patent ICA s/p CEA with no               evidence of restenosis. Vertebrals:  Bilateral vertebral arteries demonstrate antegrade flow. Subclavians: Normal flow hemodynamics were seen in bilateral subclavian              arteries. *See table(s) above for measurements and observations.  Electronically signed by Festus BarrenJason Rod Majerus MD on 03/22/2020 at 4:54:41 PM.    Final      Assessment/Plan Hyperlipidemia lipid control important in reducing the progression of atherosclerotic disease. Continue statin therapy   Essential hypertension blood pressure control important in reducing the progression of atherosclerotic disease. On appropriate oral medications.  Carotid stenosis Carotid duplex today reveals bilateral carotid endarterectomies to be widely patent without recurrent stenosis.  He is doing well.  Continue current medical regimen.  Recheck on an annual basis at this point    Festus BarrenJason Raneem Mendolia, MD  03/22/2020 5:31 PM    This note was created with Dragon medical transcription system.  Any errors from dictation are purely unintentional

## 2020-03-22 NOTE — Assessment & Plan Note (Signed)
Carotid duplex today reveals bilateral carotid endarterectomies to be widely patent without recurrent stenosis.  He is doing well.  Continue current medical regimen.  Recheck on an annual basis at this point

## 2020-03-23 ENCOUNTER — Ambulatory Visit (INDEPENDENT_AMBULATORY_CARE_PROVIDER_SITE_OTHER): Payer: 59 | Admitting: Physician Assistant

## 2020-03-23 ENCOUNTER — Encounter: Payer: Self-pay | Admitting: Physician Assistant

## 2020-03-23 VITALS — BP 130/82 | HR 88 | Ht 70.0 in | Wt 176.1 lb

## 2020-03-23 DIAGNOSIS — Z9889 Other specified postprocedural states: Secondary | ICD-10-CM

## 2020-03-23 DIAGNOSIS — I10 Essential (primary) hypertension: Secondary | ICD-10-CM

## 2020-03-23 DIAGNOSIS — R7309 Other abnormal glucose: Secondary | ICD-10-CM

## 2020-03-23 DIAGNOSIS — E782 Mixed hyperlipidemia: Secondary | ICD-10-CM | POA: Diagnosis not present

## 2020-03-23 DIAGNOSIS — E785 Hyperlipidemia, unspecified: Secondary | ICD-10-CM

## 2020-03-23 DIAGNOSIS — I6523 Occlusion and stenosis of bilateral carotid arteries: Secondary | ICD-10-CM

## 2020-03-23 MED ORDER — LOSARTAN POTASSIUM 25 MG PO TABS: 12.5000 mg | ORAL_TABLET | Freq: Every day | ORAL | 3 refills | Status: DC

## 2020-03-23 NOTE — Progress Notes (Signed)
Office Visit    Patient Name: Shawn Sherman Date of Encounter: 03/23/2020  Primary Care Provider:  Erasmo DownerBacigalupo, Angela M, MD Primary Cardiologist:  Lorine BearsMuhammad Arida, MD  Chief Complaint    Chief Complaint  Patient presents with  . office visit    F/U after carotid U/S; Meds verbally reviewed with patient.    64 year old with history of hypertension, hyperlipidemia, tobacco use, carotid artery disease s/p bilateral carotid endarterectomies, and who presents today for follow-up of hypertension.  Past Medical History    Past Medical History:  Diagnosis Date  . Anginal pain (HCC) 08/2018   a. 08/2018 - atyp c/p and neg trop; b. 08/2018 MV: EF 57%, inferolateral horizontal ST depression w/o ischemia or infarct.  . Arthritis   . Carotid arterial disease (HCC)    a. 01/2019 Carotid U/S: RICA 80-90, RCCA <50, RECA>50, LICA 80-99, LCCA>50, LECA>50; b. 01/2019 CT Head/Neck: RICA string sign in bulb - lumen <211mm. Sev prox RECA dzs. LICA 60 @ dist bulb. R vert 50-70, L vert 30-50.  . Carotid bruit present 08/2018   left  . Chewing tobacco nicotine dependence   . Essential hypertension 03/29/2017  . GERD (gastroesophageal reflux disease)   . History of kidney stones   . Hyperlipidemia    Past Surgical History:  Procedure Laterality Date  . ANTERIOR LAT LUMBAR FUSION Right 01/01/2013   Procedure: lumbar three-four extreme lateral interbody fusion;  Surgeon: Reinaldo Meekerandy O Kritzer, MD;  Location: [facility];  Service: Neurosurgery;  Laterality: Right;  Lumbar three-four extreme lateral interbody fusion    . BACK SURGERY  08  . COLONOSCOPY    . ENDARTERECTOMY Right 02/18/2019   Procedure: ENDARTERECTOMY CAROTID;  Surgeon: Annice Needyew, Jason S, MD;  Location: [facility];  Service: Vascular;  Laterality: Right;  . ENDARTERECTOMY Left 06/11/2019   Procedure: ENDARTERECTOMY CAROTID;  Surgeon: Annice Needyew, Jason S, MD;  Location: [facility];  Service: Vascular;  Laterality: Left;  . ESOPHAGOGASTRODUODENOSCOPY (EGD) WITH  PROPOFOL N/A 07/05/2015   Procedure: ESOPHAGOGASTRODUODENOSCOPY (EGD) WITH PROPOFOL;  Surgeon: Midge Miniumarren Wohl, MD;  Location: [facility];  Service: Endoscopy;  Laterality: N/A;  . EXTRACORPOREAL SHOCK WAVE LITHOTRIPSY Right 09/11/2018   Procedure: EXTRACORPOREAL SHOCK WAVE LITHOTRIPSY (ESWL);  Surgeon: Riki AltesStoioff, Scott C, MD;  Location: [facility];  Service: Urology;  Laterality: Right;  . LUMBAR PERCUTANEOUS PEDICLE SCREW 1 LEVEL N/A 01/01/2013   Procedure: lumbar three-four percutaneous pedicle screws ;  Surgeon: Reinaldo Meekerandy O Kritzer, MD;  Location: [facility];  Service: Neurosurgery;  Laterality: N/A;  lumbar three-four percutaneous pedicle screws    Allergies  No Known Allergies  History of Present Illness    Shawn RakersCalvin J Mich is a 64 y.o. male with PMH as above.  He has a history of hypertension, hyperlipidemia, tobacco use, carotid artery disease s/p bilateral carotid endarterectomies.  He was seen 08/2018 for chest pain and noted to have left carotid bruit.  Treadmill Myoview showed ST depression with exercise but he denied symptoms and perfusion imaging was normal.  He underwent bilateral carotid endarterectomies 01/2019 with Dr. Wyn Quakerew of [facility] for severe bilateral carotid stenosis.  At the time of his last visit on 12/14/2019 with Gillian Shieldsaitlin Walker, NP, he reported being out of his amlodipine for 6 months.  He was overdue for his follow-up with his primary care provider.  He was not monitoring his blood pressure at home.  He was working with PT for his left shoulder.  His blood pressure had been elevated at therapy.  He  was having difficulties with insurance coverage for PT but was hopeful to resume.  He had no plans for surgery.  He works as a Merchandiser, retail for [employer].  He is very active with his job.    On 12/15/2019, per telephone encounter, he was advised to add Zetia to his atorvastatin for more optimal LDL control.  Today, 03/23/2020, he reports that he feels fatigued due to lack of  sleep 2/2 pain but otherwise has been doing well.  He has continued to struggle with his left shoulder pain after suffering a fall earlier in the year.  After working with PT and many office visits, he states the recommendation is for him to receive an MRI next Tuesday and see an orthopedic surgeon regarding evaluation for surgery, possibly for his rotator cuff.  In addition to his left-sided shoulder pain, he is also suffering from right hip pain.  He reports right hip bursitis, for which he recently received a cortisone injection that he feels may have been injected improperly, as he did not receive relief from it.  He has tried several different medications to help relieve the pain without relief.  He is currently taking cyanocobalamin 500 MCG for sleep, which is not helping him.  He was recently advised to double his dose and see if this is effective.  BP today is elevated at 130/82 with patient reporting that this is likely due to lack of sleep, pain, and stress surrounding his job.  He states that his BP was also elevated when he saw Dr. Wyn Quaker with BP 140s /78.  Previous BP with Gillian Shields, NP was 142/84.  He is not monitoring his blood pressure at home.  He works until the early hours of the morning for his job, which also places him on a different sleep cycle.  He states he is lucky if he can get 5 hours of sleep.  He denies any chest pain, palpitations, racing heart rate, presyncope, syncope, abdominal distention, orthopnea, or breathing difficulties.  He has restarted his amlodipine and is taking this regularly and reports medication compliance.  He denies any symptoms of worsening carotid disease.  During his exam, a bruise his visualized on the left side of his abdomen and in the later stages of healing (yellow in color) with patient reported reporting that he cannot recall if he ran into anything.  Of note, updated carotid artery scan as below and performed yesterday with risk factor modification  discussed today.  Home Medications    Prior to Admission medications   Medication Sig Start Date End Date Taking? Authorizing Provider  amLODipine (NORVASC) 10 MG tablet Take 1 tablet (10 mg total) by mouth daily. 12/14/19  Yes Alver Sorrow, NP  aspirin 81 MG chewable tablet Chew 81 mg by mouth daily.    Yes [provider]  atorvastatin (LIPITOR) 80 MG tablet Take 1 tablet (80 mg total) by mouth daily. 03/23/19  Yes Bacigalupo, Marzella Schlein, MD  clopidogrel (PLAVIX) 75 MG tablet TAKE 1 TABLET BY MOUTH ONCE DAILY WITH BREAKFAST 01/26/20  Yes Dew, Marlow Baars, MD  cyclobenzaprine (FLEXERIL) 5 MG tablet Take 5 mg by mouth at bedtime as needed. 03/09/20  Yes [provider]  esomeprazole (NEXIUM) 40 MG capsule TAKE 1 CAPSULE BY MOUTH ONCE EVERY EVENING 01/21/20  Yes Bacigalupo, Marzella Schlein, MD  ezetimibe (ZETIA) 10 MG tablet Take 1 tablet (10 mg total) by mouth daily. 12/22/19 03/23/29 Yes Alver Sorrow, NP  gabapentin (NEURONTIN) 100 MG  capsule Take 1 capsule (100 mg total) by mouth 3 (three) times daily. 01/13/20  Yes Margaretann Loveless, PA-C  naproxen sodium (ALEVE) 220 MG tablet Take 440 mg by mouth daily as needed.   Yes [provider]  vitamin B-12 (CYANOCOBALAMIN) 500 MCG tablet Take 500 mcg by mouth daily.    Yes [provider]    Review of Systems    He denies chest pain, palpitations, dyspnea, pnd, orthopnea, n, v, dizziness, syncope, edema, weight gain, or early satiety.  He reports fatigue and reduced sleep 2/2 pain from his left shoulder and right hip.   All other systems reviewed and are otherwise negative except as noted above.  Physical Exam    VS:  BP 130/82 (BP Location: Left Arm, Patient Position: Sitting, Cuff Size: Normal)   Pulse 88   Ht 5\' 10"  (1.778 m)   Wt 176 lb 2 oz (79.9 kg)   SpO2 98%   BMI 25.27 kg/m  , BMI Body mass index is 25.27 kg/m. GEN: Well nourished, well developed, in no acute distress. HEENT: normal. Neck: Supple, no  JVD, carotid bruits, or masses.  Scars from bilateral endarterectomies visualized. Cardiac: RRR, no murmurs, rubs, or gallops. No clubbing, cyanosis, edema.  Radials/DP/PT 2+ and equal bilaterally.  Respiratory:  Respirations regular and unlabored, clear to auscultation bilaterally. GI: Soft, nontender, nondistended, BS + x 4.  Bruise noted on the left side of his abdomen in the later stages of healing (yellow in coloration) MS: no deformity or atrophy. Skin: warm and dry, no rash. Neuro:  Strength and sensation are intact. Psych: Normal affect.  Accessory Clinical Findings    ECG personally reviewed by me today -NSR, 81 bpm, LVH, T wave inversion in lead III resolved with nonspecific changes in lead III and aVF.- no acute changes.  VITALS Reviewed today   Temp Readings from Last 3 Encounters:  12/31/19 (!) 97.1 F (36.2 C) (Temporal)  06/12/19 98.4 F (36.9 C) (Oral)  06/02/19 98.2 F (36.8 C) (Oral)   BP Readings from Last 3 Encounters:  03/23/20 130/82  03/22/20 (!) 146/79  12/31/19 116/70   Pulse Readings from Last 3 Encounters:  03/23/20 88  03/22/20 78  12/31/19 76    Wt Readings from Last 3 Encounters:  03/23/20 176 lb 2 oz (79.9 kg)  03/22/20 175 lb (79.4 kg)  12/31/19 173 lb 9.6 oz (78.7 kg)     LABS  reviewed today   Lab Results  Component Value Date   WBC 7.0 12/14/2019   HGB 15.6 12/14/2019   HCT 45.7 12/14/2019   MCV 83 12/14/2019   PLT 187 12/14/2019   Lab Results  Component Value Date   CREATININE 0.78 12/14/2019   BUN 13 12/14/2019   NA 143 12/14/2019   K 5.0 12/14/2019   CL 106 12/14/2019   CO2 17 (L) 12/14/2019   Lab Results  Component Value Date   ALT 21 12/14/2019   AST 19 12/14/2019   ALKPHOS 124 (H) 12/14/2019   BILITOT 0.5 12/14/2019   Lab Results  Component Value Date   CHOL 163 12/14/2019   HDL 51 12/14/2019   LDLCALC 96 12/14/2019   TRIG 87 12/14/2019   CHOLHDL 3.2 12/14/2019    Lab Results  Component Value Date    HGBA1C 6.2 (A) 12/31/2019   No results found for: TSH   STUDIES/PROCEDURES reviewed today   Carotid duplex 03/22/2020 Summary:  Right Carotid: Velocities in the right ICA are consistent with  a 1-39%  stenosis.         Non-hemodynamically significant plaque <50% noted in the  CCA. The         ECA appears <50% stenosed. Widely patent ICA s/p CEA with  no         evidence of restenosis.   Left Carotid: Velocities in the left ICA are consistent with a 1-39%  stenosis.        Non-hemodynamically significant plaque <50% noted in the  CCA. The        ECA appears <50% stenosed. Widely patent ICA s/p CEA with no        evidence of restenosis.   Vertebrals: Bilateral vertebral arteries demonstrate antegrade flow.  Subclavians: Normal flow hemodynamics were seen in bilateral subclavian        arteries.  Carotid duplex 09/15/19 Summary:  Right Carotid: The ECA appears >50% stenosed.   Left Carotid: The ECA appears >50% stenosed.   Vertebrals: Bilateral vertebral arteries demonstrate antegrade flow.  Subclavians: Normal flow hemodynamics were seen in bilateral subclavian        arteries.   Assessment & Plan    Hypertension -BP continues to be elevated, despite restarting amlodipine 10 mg daily.  Discussed that some of the BP elevation is likely 2/2 pain in the left shoulder and right hip; however, given his BP continues to be elevated on a consistent basis, he would benefit from an additional antihypertensive.  Per GDMT, and given his comorbid elevated A1c as above, will start on low dose ARB for additional BP support.  Obtain BMET today. Start low-dose losartan 12.5 mg daily. Repeat BMET at RTC. Continue current dose of amlodipine 10 mg daily.  BP goal of less than 130/80.  Hyperlipidemia, LDL goal <70 -Continue atorvastatin 80 mg daily.  Given his vascular disease, LDL goal of below 70. At his most recent 12/14/2019 visit,  labs showed LDL was not at goal with Zetia 10 mg daily added.  Recheck his lipids today, and if LDL still not at goal of below 70, recommend he PCSK9 inhibitors for more optimal LDL control, such as Repatha.  He is agreeable to this plan.  Further recommendations once lipid panel has resulted, if indicated.  Ongoing lifestyle modification..  Tobacco use --Cessation encouraged to control risk factors for vascular/cardiac disease.  Bilateral carotid artery stenosis s/p bilateral carotid endarterectomy --S/p endarterectomy and follows with [facility].  Most recent scans as above show mild bilateral carotid disease.  Recommend risk factor modification with ASA 81 mg daily and and cholesterol control.  Also recommend control BP and heart rate.  As above, we will start on losartan 12.5 mg daily and escalate as tolerated.  Continue to follow with [facility].  Elevated A1c --A1c of 6.2 per recent lab work as above.  Management per PCP.  Started on losartan 12.5 mg daily, given comorbid hypertension as above.  Glycemic control recommended for risk factor modification.  Medication changes: Start losartan 12.5 mg daily. Labs ordered: Lipids, BMET today and at RTC Studies / Imaging ordered: None Future considerations: Repatha, if indicated after today's lipid panel has resulted.  Up titration of losartan as tolerated. Disposition: RTC 2 to 4 weeks.    Lennon Alstrom, PA-C 03/23/2020

## 2020-03-23 NOTE — Patient Instructions (Signed)
Medication Instructions:  - Your physician has recommended you make the following change in your medication:   1) START losartan 25 mg- take 0.5 tablet (12.5 mg) by mouth once daily   *If you need a refill on your cardiac medications before your next appointment, please call your pharmacy*   Lab Work: - Your physician recommends that you have lab work today: BMP/ Lipid  If you have labs (blood work) drawn today and your tests are completely normal, you will receive your results only by: Marland Kitchen MyChart Message (if you have MyChart) OR . A paper copy in the mail If you have any lab test that is abnormal or we need to change your treatment, we will call you to review the results.   Testing/Procedures: - none ordered   Follow-Up: At [facility], you and your health needs are our priority.  As part of our continuing mission to provide you with exceptional heart care, we have created designated Provider Care Teams.  These Care Teams include your primary Cardiologist (physician) and Advanced Practice Providers (APPs -  Physician Assistants and Nurse Practitioners) who all work together to provide you with the care you need, when you need it.  We recommend signing up for the patient portal called "MyChart".  Sign up information is provided on this After Visit Summary.  MyChart is used to connect with patients for Virtual Visits (Telemedicine).  Patients are able to view lab/test results, encounter notes, upcoming appointments, etc.  Non-urgent messages can be sent to your provider as well.   To learn more about what you can do with MyChart, go to [email or web address].    Your next appointment:   1 month(s)  The format for your next appointment:   In Person  Provider:    You may see Lorine Bears, MD or one of the following Advanced Practice Providers on your designated Care Team:    Nicolasa Ducking, NP  Eula Listen, PA-C  Marisue Ivan, PA-C    Other Instructions n/a

## 2020-03-24 ENCOUNTER — Other Ambulatory Visit: Payer: Self-pay | Admitting: Family Medicine

## 2020-03-24 DIAGNOSIS — K209 Esophagitis, unspecified without bleeding: Secondary | ICD-10-CM

## 2020-03-24 LAB — LIPID PANEL
Chol/HDL Ratio: 2.8 ratio (ref 0.0–5.0)
Cholesterol, Total: 143 mg/dL (ref 100–199)
HDL: 51 mg/dL (ref >39)
LDL Chol Calc (NIH): 70 mg/dL (ref 0–99)
Triglycerides: 124 mg/dL (ref 0–149)
VLDL Cholesterol Cal: 22 mg/dL (ref 5–40)

## 2020-03-24 LAB — BASIC METABOLIC PANEL
BUN/Creatinine Ratio: 22 (ref 10–24)
BUN: 18 mg/dL (ref 8–27)
CO2: 26 mmol/L (ref 20–29)
Calcium: 9.2 mg/dL (ref 8.6–10.2)
Chloride: 102 mmol/L (ref 96–106)
Creatinine, Ser: 0.82 mg/dL (ref 0.76–1.27)
GFR calc Af Amer: 108 mL/min/{1.73_m2} (ref >59)
GFR calc non Af Amer: 93 mL/min/{1.73_m2} (ref >59)
Glucose: 138 mg/dL — ABNORMAL HIGH (ref 65–99)
Potassium: 4.5 mmol/L (ref 3.5–5.2)
Sodium: 139 mmol/L (ref 134–144)

## 2020-03-24 NOTE — Telephone Encounter (Signed)
Requested Prescriptions  Pending Prescriptions Disp Refills   esomeprazole (NEXIUM) 40 MG capsule [Pharmacy Med Name: ESOMEPRAZOLE MAGNESIUM 40 MG CAP] 90 capsule 2    Sig: TAKE 1 CAPSULE BY MOUTH ONCE EVERY EVENING     Gastroenterology: Proton Pump Inhibitors Passed - 03/24/2020 12:47 PM      Passed - Valid encounter within last 12 months    Recent Outpatient Visits          2 months ago Essential hypertension   [facility] [place], Marzella Schlein, MD   2 years ago Right flank pain   [facility] [place], Marzella Schlein, MD   2 years ago Patellofemoral pain syndrome of right knee   [facility] [place], [place], [place]   2 years ago Acute viral syndrome   [facility] [place], [place], [place]   2 years ago Essential hypertension   [facility] [place], Molly [place], [place]      Future Appointments            In 4 weeks Dan Humphreys, Storm Frisk, NP [facility]   In 3 months Bacigalupo, Marzella Schlein, MD [facility], [facility]

## 2020-04-05 ENCOUNTER — Other Ambulatory Visit: Payer: Self-pay | Admitting: Family Medicine

## 2020-04-05 DIAGNOSIS — E782 Mixed hyperlipidemia: Secondary | ICD-10-CM

## 2020-04-22 ENCOUNTER — Encounter: Payer: Self-pay | Admitting: Family

## 2020-04-22 ENCOUNTER — Ambulatory Visit (INDEPENDENT_AMBULATORY_CARE_PROVIDER_SITE_OTHER): Payer: 59 | Admitting: Family

## 2020-04-22 ENCOUNTER — Other Ambulatory Visit: Payer: Self-pay

## 2020-04-22 VITALS — BP 120/72 | HR 82 | Ht 70.0 in | Wt 176.0 lb

## 2020-04-22 DIAGNOSIS — Z9889 Other specified postprocedural states: Secondary | ICD-10-CM | POA: Diagnosis not present

## 2020-04-22 DIAGNOSIS — I10 Essential (primary) hypertension: Secondary | ICD-10-CM | POA: Diagnosis not present

## 2020-04-22 DIAGNOSIS — I6523 Occlusion and stenosis of bilateral carotid arteries: Secondary | ICD-10-CM | POA: Diagnosis not present

## 2020-04-22 DIAGNOSIS — E785 Hyperlipidemia, unspecified: Secondary | ICD-10-CM | POA: Diagnosis not present

## 2020-04-22 MED ORDER — AMLODIPINE BESYLATE 10 MG PO TABS: 10.0000 mg | ORAL_TABLET | Freq: Every day | ORAL | 1 refills | Status: DC

## 2020-04-22 MED ORDER — LOSARTAN POTASSIUM 25 MG PO TABS: 12.5000 mg | ORAL_TABLET | Freq: Every day | ORAL | 1 refills | Status: DC

## 2020-04-22 NOTE — Progress Notes (Signed)
Rx refill sent to pharmacy. 

## 2020-04-22 NOTE — Progress Notes (Signed)
Office Visit    Patient Name: KERIC ZEHREN Date of Encounter: 04/22/2020  Primary Care Provider:  Erasmo Downer, MD Primary Cardiologist:  Lorine Bears, MD Electrophysiologist:  None   Chief Complaint    ELON EOFF is a 64 y.o. male with a hx of HTN, HLD, tobacco use, carotid artery disease s/p bilateral carotid endarterectomies presents today for follow up of HTN.   Past Medical History    Past Medical History:  Diagnosis Date  . Anginal pain (HCC) 08/2018   a. 08/2018 - atyp c/p and neg trop; b. 08/2018 MV: EF 57%, inferolateral horizontal ST depression w/o ischemia or infarct.  . Arthritis   . Carotid arterial disease (HCC)    a. 01/2019 Carotid U/S: RICA 80-90, RCCA <50, RECA>50, LICA 80-99, LCCA>50, LECA>50; b. 01/2019 CT Head/Neck: RICA string sign in bulb - lumen <24mm. Sev prox RECA dzs. LICA 60 @ dist bulb. R vert 50-70, L vert 30-50.  . Carotid bruit present 08/2018   left  . Chewing tobacco nicotine dependence   . Essential hypertension 03/29/2017  . GERD (gastroesophageal reflux disease)   . History of kidney stones   . Hyperlipidemia    Past Surgical History:  Procedure Laterality Date  . ANTERIOR LAT LUMBAR FUSION Right 01/01/2013   Procedure: lumbar three-four extreme lateral interbody fusion;  Surgeon: Reinaldo Meeker, MD;  Location: [facility];  Service: Neurosurgery;  Laterality: Right;  Lumbar three-four extreme lateral interbody fusion    . BACK SURGERY  08  . COLONOSCOPY    . ENDARTERECTOMY Right 02/18/2019   Procedure: ENDARTERECTOMY CAROTID;  Surgeon: Annice Needy, MD;  Location: [facility];  Service: Vascular;  Laterality: Right;  . ENDARTERECTOMY Left 06/11/2019   Procedure: ENDARTERECTOMY CAROTID;  Surgeon: Annice Needy, MD;  Location: [facility];  Service: Vascular;  Laterality: Left;  . ESOPHAGOGASTRODUODENOSCOPY (EGD) WITH PROPOFOL N/A 07/05/2015   Procedure: ESOPHAGOGASTRODUODENOSCOPY (EGD) WITH PROPOFOL;  Surgeon: Midge Minium, MD;   Location: [facility];  Service: Endoscopy;  Laterality: N/A;  . EXTRACORPOREAL SHOCK WAVE LITHOTRIPSY Right 09/11/2018   Procedure: EXTRACORPOREAL SHOCK WAVE LITHOTRIPSY (ESWL);  Surgeon: Riki Altes, MD;  Location: [facility];  Service: Urology;  Laterality: Right;  . LUMBAR PERCUTANEOUS PEDICLE SCREW 1 LEVEL N/A 01/01/2013   Procedure: lumbar three-four percutaneous pedicle screws ;  Surgeon: Reinaldo Meeker, MD;  Location: [facility];  Service: Neurosurgery;  Laterality: N/A;  lumbar three-four percutaneous pedicle screws    Allergies  No Known Allergies  History of Present Illness    LATRAVIOUS LEVITT is a 64 y.o. male with a hx of HTN, HLD, tobacco use, carotid artery disease s/p bilateral carotid endarterectomies. He was last seen 03/10/2020 by Leafy Kindle, PA.  Seen 08/2018 for chest pain and noted to have left carotid bruit. Treadmill myoview with ST depression with exercise but reports on symptoms and perfusion imaging was normal. He underwent bilateral carotid endarterectomies 01/2019 with Dr. Wyn Quaker of [facility] for severe bilateral carotid stenosis.   Seen in clinic 12/27/2019 was noted to be out of his amlodipine for 6 months. His blood pressure was elevated and started amlodipine 10 mg daily was resumed. He was also recommended for the addition of Zetia as lipid panel 12/14/19 with LDL 96.   Seen in follow-up by Leafy Kindle, PA. Repeat lab work 03/18/2020 with total cholesterol 143, HDL 51, LDL 70. He was started on losartan 12.5 mg daily as his BP was not at  goal of less than 130/80.  Presents today for follow-up. Reports BP is well controlled at home. Reports no shortness of breath nor dyspnea on exertion. Reports no chest pain, pressure, or tightness. No edema, orthopnea, PND. Reports no palpitations. Endorses intermittent lightheadedness that is not bothersome and stable at baseline.  He is following with orthopedics regarding left shoulder pain. PT was trialed with no improvement. He is  planning to have surgery, but awaiting appointment as he had to be referred to a different surgeon within the practice who does this particular type of surgery.  EKGs/Labs/Other Studies Reviewed:   The following studies were reviewed today:  Carotid duplex 03/2020 Summary:  Right Carotid: Velocities in the right ICA are consistent with a 1-39%  stenosis.                Non-hemodynamically significant plaque <50% noted in the  CCA. The                 ECA appears <50% stenosed. Widely patent ICA s/p CEA with  no                evidence of restenosis.   Left Carotid: Velocities in the left ICA are consistent with a 1-39%  stenosis.               Non-hemodynamically significant plaque <50% noted in the  CCA. The                ECA appears <50% stenosed. Widely patent ICA s/p CEA with no                evidence of restenosis.   Vertebrals:  Bilateral vertebral arteries demonstrate antegrade flow.  Subclavians: Normal flow hemodynamics were seen in bilateral subclavian               arteries.   Carotid duplex 09/15/19 Summary:  Right Carotid: The ECA appears >50% stenosed.   Left Carotid: The ECA appears >50% stenosed.   Vertebrals:  Bilateral vertebral arteries demonstrate antegrade flow.  Subclavians: Normal flow hemodynamics were seen in bilateral subclavian               arteries.    EKG:  No EKG today. EKG independently reviewed from 03/22/2020 shows normal sinus rhythm at 88 beats per minutes with no acute ST/T wave changes.  Recent Labs: 12/14/2019: ALT 21; Hemoglobin 15.6; Platelets 187 03/23/2020: BUN 18; Creatinine, Ser 0.82; Potassium 4.5; Sodium 139  Recent Lipid Panel    Component Value Date/Time   CHOL 143 03/23/2020 1230   TRIG 124 03/23/2020 1230   HDL 51 03/23/2020 1230   CHOLHDL 2.8 03/23/2020 1230   CHOLHDL 2.7 04/19/2017 1143   LDLCALC 70 03/23/2020 1230   LDLCALC 73 04/19/2017 1143   Home Medications   Current Meds  Medication Sig  . amLODipine  (NORVASC) 10 MG tablet Take 1 tablet (10 mg total) by mouth daily.  Marland Kitchen aspirin 81 MG chewable tablet Takes 1 tablet by mouth most days  . atorvastatin (LIPITOR) 80 MG tablet TAKE 1 TABLET BY MOUTH ONCE DAILY  . clopidogrel (PLAVIX) 75 MG tablet TAKE 1 TABLET BY MOUTH ONCE DAILY WITH BREAKFAST  . cyclobenzaprine (FLEXERIL) 5 MG tablet Take 5 mg by mouth at bedtime as needed.  Marland Kitchen esomeprazole (NEXIUM) 40 MG capsule TAKE 1 CAPSULE BY MOUTH ONCE EVERY EVENING  . ezetimibe (ZETIA) 10 MG tablet Take 1 tablet (10 mg total) by mouth daily.  Marland Kitchen gabapentin (NEURONTIN)  100 MG capsule Take 1 capsule (100 mg total) by mouth 3 (three) times daily.  Marland Kitchen losartan (COZAAR) 25 MG tablet Take 0.5 tablets (12.5 mg total) by mouth daily.  . Multiple Vitamins-Minerals (AIRBORNE PO) Take by mouth daily.  . naproxen sodium (ALEVE) 220 MG tablet Take 440 mg by mouth daily as needed.  . traMADol (ULTRAM) 50 MG tablet Take 50 mg by mouth every 6 (six) hours as needed.  . vitamin B-12 (CYANOCOBALAMIN) 500 MCG tablet Take 500 mcg by mouth daily.     Review of Systems   Review of Systems  Constitutional: Negative for chills, fever and malaise/fatigue.  Cardiovascular: Negative for chest pain, dyspnea on exertion, leg swelling, near-syncope, orthopnea, palpitations and syncope.  Respiratory: Negative for cough, shortness of breath and wheezing.   Musculoskeletal: Positive for joint pain (L shoulder).  Gastrointestinal: Negative for nausea and vomiting.  Neurological: Negative for dizziness, light-headedness and weakness.   All other systems reviewed and are otherwise negative except as noted above.  Physical Exam    VS:  BP 120/72 (BP Location: Left Arm, Patient Position: Sitting, Cuff Size: Normal)   Pulse 82   Ht 5\' 10"  (1.778 m)   Wt 176 lb (79.8 kg)   SpO2 96%   BMI 25.25 kg/m  , BMI Body mass index is 25.25 kg/m. GEN: Well nourished, well developed, in no acute distress. HEENT: normal. Neck: Supple, no JVD,  carotid bruits, or masses. Cardiac: RRR, no murmurs, rubs, or gallops. No clubbing, cyanosis, edema.  Radials/DP/PT 2+ and equal bilaterally.  Respiratory:  Respirations regular and unlabored, clear to auscultation bilaterally. GI: Soft, nontender, nondistended,. MS: No deformity or atrophy. Skin: Warm and dry, no rash.  Right shin with healing abrasion that is clean, dry, intact.  Noted yellowed ecchymosis to right calf and shin.  No erythema nor swelling. Neuro:  Strength and sensation are intact. Psych: Normal affect.  Assessment & Plan    1. Preoperative cardiovascular clearance -Anticipates shoulder surgery. Awaiting appointment with orthopedics, was sent to a different provider in the office who does the particular shoulder surgery. According to the Revised Cardiac Risk Index (RCRI), his Perioperative Risk of Major Cardiac Event is (%): 0.9. His Functional Capacity in METs is: 7.34 according to the Duke Activity Status Index (DASI). If surgery is scheduled within the next 6 months - based on ACC/AHA guidelines, the patient would be at acceptable risk for the planned procedure without further cardiovascular testing. The patient was advised that if he develops new symptoms prior to surgery to contact our office to arrange for a follow-up visit, and he verbalized understanding. Any questions regarding Plavix would need to be routed to Dr. of [facility].  2. HTN - BP well controlled. Continue current antihypertensive regimen including amlodipine 10 mg daily, losartan 12.5 mg daily. BMP today for monitoring.  3. HLD, LDL goal <70 - 03/2020 LDL 70. Continue Atorvastatin 80mg  daily, Zetia 10mg  daily. Recommend lipid-lowering diet.  4. Tobacco use - Continues to chew.  Cessation encouraged.  5. Bilateral carotid artery stenosis s/p bilateral carotid endarterectomy - Continue to follow with Dr. 06-28-1979 of [facility]. 03/2020 carotid duplex with bilateral 1 to 39% stenosis.  Disposition: Follow up in 4 month(s)  with Dr. , NP 04/22/2020, 11:28 AM

## 2020-04-22 NOTE — Patient Instructions (Signed)
Medication Instructions:  No medication changes today.   *If you need a refill on your cardiac medications before your next appointment, please call your pharmacy*  Lab Work: Your provider recommends that you return for lab work today: BMP  If you have labs (blood work) drawn today and your tests are completely normal, you will receive your results only by:  MyChart Message (if you have MyChart) OR  A paper copy in the mail If you have any lab test that is abnormal or we need to change your treatment, we will call you to review the results.  Testing/Procedures: None ordered today   Follow-Up: At [facility], you and your health needs are our priority.  As part of our continuing mission to provide you with exceptional heart care, we have created designated Provider Care Teams.  These Care Teams include your primary Cardiologist (physician) and Advanced Practice Providers (APPs -  Physician Assistants and Nurse Practitioners) who all work together to provide you with the care you need, when you need it.  We recommend signing up for the patient portal called "MyChart".  Sign up information is provided on this After Visit Summary.  MyChart is used to connect with patients for Virtual Visits (Telemedicine).  Patients are able to view lab/test results, encounter notes, upcoming appointments, etc.  Non-urgent messages can be sent to your provider as well.   To learn more about what you can do with MyChart, go to [email or web address].    Your next appointment:   4 month(s)  The format for your next appointment:   In Person  Provider:   You may see Lorine Bears, MD or one of the following Advanced Practice Providers on your designated Care Team:    Nicolasa Ducking, NP  Eula Listen, PA-C  Marisue Ivan, PA-C  Cadence Sutton, [place]  Gillian Shields, NP  Other Instructions

## 2020-04-23 LAB — BASIC METABOLIC PANEL
BUN/Creatinine Ratio: 16 (ref 10–24)
BUN: 12 mg/dL (ref 8–27)
CO2: 24 mmol/L (ref 20–29)
Calcium: 8.7 mg/dL (ref 8.6–10.2)
Chloride: 103 mmol/L (ref 96–106)
Creatinine, Ser: 0.73 mg/dL — ABNORMAL LOW (ref 0.76–1.27)
GFR calc Af Amer: 113 mL/min/{1.73_m2} (ref >59)
GFR calc non Af Amer: 98 mL/min/{1.73_m2} (ref >59)
Glucose: 146 mg/dL — ABNORMAL HIGH (ref 65–99)
Potassium: 4.1 mmol/L (ref 3.5–5.2)
Sodium: 138 mmol/L (ref 134–144)

## 2020-05-03 ENCOUNTER — Other Ambulatory Visit: Payer: Self-pay

## 2020-05-03 ENCOUNTER — Encounter: Payer: Self-pay | Admitting: Family Medicine

## 2020-05-03 ENCOUNTER — Ambulatory Visit (INDEPENDENT_AMBULATORY_CARE_PROVIDER_SITE_OTHER): Payer: 59 | Admitting: Family Medicine

## 2020-05-03 ENCOUNTER — Ambulatory Visit
Admission: RE | Admit: 2020-05-03 | Discharge: 2020-05-03 | Disposition: A | Discharge status: Home / Self Care | Payer: 59 | Source: Ambulatory Visit | Attending: Family Medicine | Admitting: Family Medicine

## 2020-05-03 ENCOUNTER — Ambulatory Visit
Admission: RE | Admit: 2020-05-03 | Discharge: 2020-05-03 | Disposition: A | Discharge status: Home / Self Care | Payer: 59 | Attending: Family Medicine | Admitting: Family Medicine

## 2020-05-03 VITALS — BP 120/82 | HR 94 | Temp 98.6°F | Wt 177.0 lb

## 2020-05-03 DIAGNOSIS — M25561 Pain in right knee: Secondary | ICD-10-CM | POA: Insufficient documentation

## 2020-05-03 DIAGNOSIS — M25551 Pain in right hip: Secondary | ICD-10-CM | POA: Insufficient documentation

## 2020-05-03 MED ORDER — TRAMADOL HCL 50 MG PO TABS: 50.0000 mg | ORAL_TABLET | Freq: Four times a day (QID) | ORAL | 1 refills | Status: DC | PRN

## 2020-05-03 NOTE — Progress Notes (Addendum)
Acute Office Visit  Subjective:    Patient ID: Shawn Sherman, male    DOB: 1955-11-05, 64 y.o.   MRN: [id number]  No chief complaint on file.   HPI Patient is in today for right groin, hip and leg pain.  He states he has had pain for 6 days.  He has been unable to work during this time due to the pain.  He states the pain is present at all times but worse with walking. Walking causes the pain to radiate down his leg.   Past Medical History:  Diagnosis Date   Anginal pain (HCC) 08/2018   a. 08/2018 - atyp c/p and neg trop; b. 08/2018 MV: EF 57%, inferolateral horizontal ST depression w/o ischemia or infarct.   Arthritis    Carotid arterial disease (HCC)    a. 01/2019 Carotid U/S: RICA 80-90, RCCA <50, RECA>50, LICA 80-99, LCCA>50, LECA>50; b. 01/2019 CT Head/Neck: RICA string sign in bulb - lumen <25mm. Sev prox RECA dzs. LICA 60 @ dist bulb. R vert 50-70, L vert 30-50.   Carotid bruit present 08/2018   left   Chewing tobacco nicotine dependence    Essential hypertension 03/29/2017   GERD (gastroesophageal reflux disease)    History of kidney stones    Hyperlipidemia     Past Surgical History:  Procedure Laterality Date   ANTERIOR LAT LUMBAR FUSION Right 01/01/2013   Procedure: lumbar three-four extreme lateral interbody fusion;  Surgeon: Reinaldo Meeker, MD;  Location: [facility];  Service: Neurosurgery;  Laterality: Right;  Lumbar three-four extreme lateral interbody fusion     BACK SURGERY  08   COLONOSCOPY     ENDARTERECTOMY Right 02/18/2019   Procedure: ENDARTERECTOMY CAROTID;  Surgeon: Annice Needy, MD;  Location: [facility];  Service: Vascular;  Laterality: Right;   ENDARTERECTOMY Left 06/11/2019   Procedure: ENDARTERECTOMY CAROTID;  Surgeon: Annice Needy, MD;  Location: [facility];  Service: Vascular;  Laterality: Left;   ESOPHAGOGASTRODUODENOSCOPY (EGD) WITH PROPOFOL N/A 07/05/2015   Procedure: ESOPHAGOGASTRODUODENOSCOPY (EGD) WITH PROPOFOL;  Surgeon: Midge Minium, MD;  Location:  [facility];  Service: Endoscopy;  Laterality: N/A;   EXTRACORPOREAL SHOCK WAVE LITHOTRIPSY Right 09/11/2018   Procedure: EXTRACORPOREAL SHOCK WAVE LITHOTRIPSY (ESWL);  Surgeon: Riki Altes, MD;  Location: [facility];  Service: Urology;  Laterality: Right;   LUMBAR PERCUTANEOUS PEDICLE SCREW 1 LEVEL N/A 01/01/2013   Procedure: lumbar three-four percutaneous pedicle screws ;  Surgeon: Reinaldo Meeker, MD;  Location: [facility];  Service: Neurosurgery;  Laterality: N/A;  lumbar three-four percutaneous pedicle screws    Family History  Problem Relation Age of Onset   Cancer Mother    Cancer Father    COPD Sister     Social History   Socioeconomic History   Marital status: Married    Spouse name: Joyce Gross   Number of children: Not on file   Years of education: Not on file   Highest education level: Not on file  Occupational History   Not on file  Tobacco Use   Smoking status: Never Smoker   Smokeless tobacco: Current User    Types: Chew  Vaping Use   Vaping Use: Never used  Substance and Sexual Activity   Alcohol use: No   Drug use: No   Sexual activity: Yes    Birth control/protection: None  Other Topics Concern   Not on file  Social History Narrative   Not on file   Social Determinants of Health  Financial Resource Strain:    Difficulty of Paying Living Expenses: Not on file  Food Insecurity:    Worried About Programme researcher, broadcasting/film/video in the Last Year: Not on file   [employer] of Food in the Last Year: Not on file  Transportation Needs:    Lack of Transportation (Medical): Not on file   Lack of Transportation (Non-Medical): Not on file  Physical Activity:    Days of Exercise per Week: Not on file   Minutes of Exercise per Session: Not on file  Stress:    Feeling of Stress : Not on file  Social Connections:    Frequency of Communication with Friends and Family: Not on file   Frequency of Social Gatherings with Friends and Family: Not on file   Attends Religious  Services: Not on file   Active Member of Clubs or Organizations: Not on file   Attends Banker Meetings: Not on file   Marital Status: Not on file  Intimate Partner Violence:    Fear of Current or Ex-Partner: Not on file   Emotionally Abused: Not on file   Physically Abused: Not on file   Sexually Abused: Not on file    Outpatient Medications Prior to Visit  Medication Sig Dispense Refill   amLODipine (NORVASC) 10 MG tablet Take 1 tablet (10 mg total) by mouth daily. 90 tablet 1   aspirin 81 MG chewable tablet Takes 1 tablet by mouth most days     atorvastatin (LIPITOR) 80 MG tablet TAKE 1 TABLET BY MOUTH ONCE DAILY 90 tablet 3   clopidogrel (PLAVIX) 75 MG tablet TAKE 1 TABLET BY MOUTH ONCE DAILY WITH BREAKFAST 90 tablet 3   cyclobenzaprine (FLEXERIL) 5 MG tablet Take 5 mg by mouth at bedtime as needed.     esomeprazole (NEXIUM) 40 MG capsule TAKE 1 CAPSULE BY MOUTH ONCE EVERY EVENING 90 capsule 2   ezetimibe (ZETIA) 10 MG tablet Take 1 tablet (10 mg total) by mouth daily. 90 tablet 3   gabapentin (NEURONTIN) 100 MG capsule Take 1 capsule (100 mg total) by mouth 3 (three) times daily. 90 capsule 3   losartan (COZAAR) 25 MG tablet Take 0.5 tablets (12.5 mg total) by mouth daily. 45 tablet 1   Multiple Vitamins-Minerals (AIRBORNE PO) Take by mouth daily.     naproxen sodium (ALEVE) 220 MG tablet Take 440 mg by mouth daily as needed.     traMADol (ULTRAM) 50 MG tablet Take 50 mg by mouth every 6 (six) hours as needed.     vitamin B-12 (CYANOCOBALAMIN) 500 MCG tablet Take 500 mcg by mouth daily.      No facility-administered medications prior to visit.    No Known Allergies  Review of Systems  Musculoskeletal:  Positive for arthralgias, back pain, gait problem and myalgias. Negative for joint swelling, neck pain and neck stiffness.      Objective:    Physical Exam Constitutional:      General: He is not in acute distress.    Appearance: He is well-developed.  HENT:      Head: Normocephalic and atraumatic.     Right Ear: Hearing normal.     Left Ear: Hearing normal.     Nose: Nose normal.  Eyes:     General: Lids are normal. No scleral icterus.       Right eye: No discharge.        Left eye: No discharge.     Conjunctiva/sclera: Conjunctivae normal.  Cardiovascular:  Rate and Rhythm: Normal rate.  Pulmonary:     Effort: Pulmonary effort is normal. No respiratory distress.  Musculoskeletal:     Comments: Pain in the right groin with stiffness of hip joint. Increased discomfort to rotate the right leg. Some crepitus in the right knee without redness or swelling. No significant neurovascular deficits.  Skin:    Findings: No lesion or rash.  Neurological:     Mental Status: He is alert and oriented to person, place, and time.  Psychiatric:        Speech: Speech normal.        Behavior: Behavior normal.        Thought Content: Thought content normal.    BP 120/82   Pulse 94   Temp 98.6 F (37 C)   Wt 177 lb (80.3 kg)   SpO2 99%   BMI 25.40 kg/m  Wt Readings from Last 3 Encounters:  04/22/20 176 lb (79.8 kg)  03/23/20 176 lb 2 oz (79.9 kg)  03/22/20 175 lb (79.4 kg)    Health Maintenance Due  Topic Date Due   COVID-19 Vaccine (1) Never done   HIV Screening  Never done   COLONOSCOPY  03/30/2018   INFLUENZA VACCINE  Never done    There are no preventive care reminders to display for this patient.   No results found for: TSH Lab Results  Component Value Date   WBC 7.0 12/14/2019   HGB 15.6 12/14/2019   HCT 45.7 12/14/2019   MCV 83 12/14/2019   PLT 187 12/14/2019   Lab Results  Component Value Date   NA 138 04/22/2020   K 4.1 04/22/2020   CO2 24 04/22/2020   GLUCOSE 146 (H) 04/22/2020   BUN 12 04/22/2020   CREATININE 0.73 (L) 04/22/2020   BILITOT 0.5 12/14/2019   ALKPHOS 124 (H) 12/14/2019   AST 19 12/14/2019   ALT 21 12/14/2019   PROT 5.9 (L) 12/14/2019   ALBUMIN 4.1 12/14/2019   CALCIUM 8.7 04/22/2020    ANIONGAP 6 06/12/2019   Lab Results  Component Value Date   CHOL 143 03/23/2020   Lab Results  Component Value Date   HDL 51 03/23/2020   Lab Results  Component Value Date   LDLCALC 70 03/23/2020   Lab Results  Component Value Date   TRIG 124 03/23/2020   Lab Results  Component Value Date   CHOLHDL 2.8 03/23/2020   Lab Results  Component Value Date   HGBA1C 6.2 (A) 12/31/2019       Assessment & Plan:   1. Right hip pain Pain in the right groin and to attempt rotation or placing right ankle on the left knee. Pain started 6 days ago without specific injury known. Will give Tramadol for suspected arthritis. May continue Naproxen for inflammation and get x-ray evaluation. - DG Hip Unilat W OR W/O Pelvis 2-3 Views Right - traMADol (ULTRAM) 50 MG tablet; Take 1 tablet (50 mg total) by mouth every 6 (six) hours as needed.  Dispense: 30 tablet; Refill: 1  2. Right knee pain, unspecified chronicity Some crepitus in the right knee. Suspect arthritis. May apply moist heat and will get x-ray evaluation. - DG Knee Complete 4 Views Right - traMADol (ULTRAM) 50 MG tablet; Take 1 tablet (50 mg total) by mouth every 6 (six) hours as needed.  Dispense: 30 tablet; Refill: 1    No orders of the defined types were placed in this encounter.  I, Dortha Kern, PA, have reviewed all  documentation for this visit. The documentation on 05/03/20 for the exam, diagnosis, procedures, and orders are all accurate and complete.   Adline PealsElena D DeSanto, CMA

## 2020-05-09 ENCOUNTER — Telehealth: Payer: Self-pay

## 2020-05-09 NOTE — Telephone Encounter (Signed)
Copied from CRM [id number]. Topic: General - Inquiry >> May 09, 2020  3:38 PM Daphine Deutscher D wrote: Reason for CRM: Pt called Tuesday and he is waiting for his xray results. Hip and knee.  CB#  [phone]

## 2020-05-10 ENCOUNTER — Telehealth: Payer: Self-pay

## 2020-05-10 ENCOUNTER — Other Ambulatory Visit: Payer: Self-pay | Admitting: Family Medicine

## 2020-05-10 DIAGNOSIS — M25561 Pain in right knee: Secondary | ICD-10-CM

## 2020-05-10 DIAGNOSIS — M25551 Pain in right hip: Secondary | ICD-10-CM

## 2020-05-10 NOTE — Telephone Encounter (Signed)
Patient notified

## 2020-05-10 NOTE — Telephone Encounter (Signed)
Placed order and should hear from the schedulers soon.

## 2020-05-10 NOTE — Telephone Encounter (Signed)
Copied from CRM [id number]. Topic: General - Other >> May 10, 2020  1:41 PM Marylen Ponto wrote: Reason for CRM: Pt stated he has a doctor's note to return back to work today but he would like to request an extension. Pt requests call back. Cb# [phone]

## 2020-05-10 NOTE — Telephone Encounter (Signed)
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 Milder arthritis in the left hip. If medications not settling the discomfort, recommend referral to an orthopedist for possible joint cortisone injection.

## 2020-05-10 NOTE — Telephone Encounter (Signed)
Patient advised wanted to proceed with referral.

## 2020-05-11 NOTE — Telephone Encounter (Signed)
Pt called and is requesting to have this note today. Please advise.

## 2020-05-11 NOTE — Telephone Encounter (Signed)
Will forward to provider that wrote the work note and saw him for this problem.

## 2020-05-11 NOTE — Telephone Encounter (Signed)
I called and advised patient that Maurine Minister is out of the office today. Patient verbalized understanding and agrees to wait until tomorrow for a response. Patient states he is still having difficulty walking and doesn't think he can go back to work right now. Please advise.

## 2020-05-12 ENCOUNTER — Encounter: Payer: Self-pay | Admitting: Family Medicine

## 2020-05-12 NOTE — Telephone Encounter (Signed)
Patient advised.

## 2020-05-12 NOTE — Telephone Encounter (Signed)
Letter printed for him to pick up

## 2020-05-16 ENCOUNTER — Other Ambulatory Visit: Payer: Self-pay | Admitting: Physician Assistant

## 2020-05-16 DIAGNOSIS — M7061 Trochanteric bursitis, right hip: Secondary | ICD-10-CM

## 2020-05-16 NOTE — Telephone Encounter (Signed)
Requested Prescriptions  Pending Prescriptions Disp Refills  . gabapentin (NEURONTIN) 100 MG capsule [Pharmacy Med Name: GABAPENTIN 100 MG CAP] 90 capsule 3    Sig: TAKE 1 CAPSULE BY MOUTH 3 TIMES DAILY     Neurology: Anticonvulsants - gabapentin Passed - 05/16/2020 10:27 AM      Passed - Valid encounter within last 12 months    Recent Outpatient Visits          1 week ago Right hip pain   [facility] [place], [place], [place]   4 months ago Essential hypertension   [facility], Marzella Schlein, [place]   2 years ago Right flank pain   [facility] [place], Marzella Schlein, [place]   2 years ago Patellofemoral pain syndrome of right knee   [facility] [place], [place], [place]   2 years ago Acute viral syndrome   [facility] [place], [place], [place]      Future Appointments            In 1 month Bacigalupo, Marzella Schlein, MD [facility], [facility]   In 3 months Dan Humphreys, Storm Frisk, NP [facility] [id number]

## 2020-05-31 ENCOUNTER — Telehealth: Payer: Self-pay | Admitting: Cardiovascular Disease

## 2020-05-31 DIAGNOSIS — M75120 Complete rotator cuff tear or rupture of unspecified shoulder, not specified as traumatic: Secondary | ICD-10-CM | POA: Insufficient documentation

## 2020-05-31 NOTE — Telephone Encounter (Signed)
   [facility] Pre-operative Risk Assessment    HEARTCARE STAFF: - Please ensure there is not already an duplicate clearance open for this procedure. - Under Visit Info/Reason for Call, type in Other and utilize the format Clearance MM/DD/YY or Clearance TBD. Do not use dashes or single digits. - If request is for dental extraction, please clarify the # of teeth to be extracted.  Request for surgical clearance:  1. What type of surgery is being performed? L shoulder rotator cuff repair  2. When is this surgery scheduled? TBD  3. What type of clearance is required (medical clearance vs. Pharmacy clearance to hold med vs. Both)? both  4. Are there any medications that need to be held prior to surgery and how long?not noted  5. Practice name and name of physician performing surgery? [facility] Kurtis Bushman  6. What is the office phone number? [phone]   7.   What is the office fax number? [phone]  8.   Anesthesia type (None, local, MAC, general) ? Not noted   Marykay Lex 05/31/2020, 3:31 PM  _________________________________________________________________   (provider comments below)

## 2020-05-31 NOTE — Telephone Encounter (Signed)
   Primary Cardiologist: Lorine Bears, MD  Chart reviewed as part of pre-operative protocol coverage. Patient was recently seen by Gillian Shields, NP, on 04/22/2020 at which time he was doing well from a cardiac standpoint.   Per Caitlin's note: "According to the Revised Cardiac Risk Index (RCRI), his Perioperative Risk of Major Cardiac Event is (%): 0.9. His Functional Capacity in METs is: 7.34 according to the Duke Activity Status Index (DASI). If surgery is scheduled within the next 6 months - based on ACC/AHA guidelines, the patient would be at acceptable risk for the planned procedure without further cardiovascular testing. The patient was advised that if he develops new symptoms prior to surgery to contact our office to arrange for a follow-up visit, and he verbalized understanding. Any questions regarding Plavix would need to be routed to Dr. Wyn Quaker of [facility]."  I will route this recommendation to the requesting party via Epic fax function and remove from pre-op pool.  Please call with questions.  Corrin Parker, PA-C 05/31/2020, 5:11 PM

## 2020-06-27 ENCOUNTER — Ambulatory Visit: Payer: 59 | Admitting: Family Medicine

## 2020-06-27 ENCOUNTER — Other Ambulatory Visit: Payer: Self-pay

## 2020-07-25 ENCOUNTER — Encounter: Payer: Self-pay | Admitting: Orthopedic Surgery

## 2020-07-25 ENCOUNTER — Other Ambulatory Visit: Payer: Self-pay | Admitting: Orthopedic Surgery

## 2020-07-25 NOTE — H&P (Signed)
NAME: Shawn Sherman MRN:   [id number] DOB:   29-Oct-1955     HISTORY AND PHYSICAL  CHIEF COMPLAINT:  Left shoulder pain  HISTORY:   Shawn Sherman a 65 y.o. male  with left  Shoulder Pain Patient complains of left shoulder pain. The symptoms began several months ago. Aggravating factors: work related injury. Pain is located between the neck and shoulder, around the acromioclavicular [facility]) joint and diffusely throughout the shoulder. Discomfort is described as aching, burning and sharp/stabbing. Symptoms are exacerbated by repetitive movements, overhead movements and lying on the shoulder. Evaluation to date: MRI: abnormal rotator cuff tear. Plan for left shoulder arthroscopy with superior capsular reconstruction.  PAST MEDICAL HISTORY:   Past Medical History:  Diagnosis Date  . Anginal pain (HCC) 08/2018   a. 08/2018 - atyp c/p and neg trop; b. 08/2018 MV: EF 57%, inferolateral horizontal ST depression w/o ischemia or infarct.  . Arthritis   . Carotid arterial disease (HCC)    a. 01/2019 Carotid U/S: RICA 80-90, RCCA <50, RECA>50, LICA 80-99, LCCA>50, LECA>50; b. 01/2019 CT Head/Neck: RICA string sign in bulb - lumen <31mm. Sev prox RECA dzs. LICA 60 @ dist bulb. R vert 50-70, L vert 30-50.  . Carotid bruit present 08/2018   left  . Chewing tobacco nicotine dependence   . Essential hypertension 03/29/2017  . GERD (gastroesophageal reflux disease)   . History of kidney stones   . Hyperlipidemia     PAST SURGICAL HISTORY:   Past Surgical History:  Procedure Laterality Date  . ANTERIOR LAT LUMBAR FUSION Right 01/01/2013   Procedure: lumbar three-four extreme lateral interbody fusion;  Surgeon: Reinaldo Meeker, MD;  Location: [facility];  Service: Neurosurgery;  Laterality: Right;  Lumbar three-four extreme lateral interbody fusion    . BACK SURGERY  08  . COLONOSCOPY    . ENDARTERECTOMY Right 02/18/2019   Procedure: ENDARTERECTOMY CAROTID;  Surgeon: Annice Needy, MD;  Location: [facility];   Service: Vascular;  Laterality: Right;  . ENDARTERECTOMY Left 06/11/2019   Procedure: ENDARTERECTOMY CAROTID;  Surgeon: Annice Needy, MD;  Location: [facility];  Service: Vascular;  Laterality: Left;  . ESOPHAGOGASTRODUODENOSCOPY (EGD) WITH PROPOFOL N/A 07/05/2015   Procedure: ESOPHAGOGASTRODUODENOSCOPY (EGD) WITH PROPOFOL;  Surgeon: Midge Minium, MD;  Location: [facility];  Service: Endoscopy;  Laterality: N/A;  . EXTRACORPOREAL SHOCK WAVE LITHOTRIPSY Right 09/11/2018   Procedure: EXTRACORPOREAL SHOCK WAVE LITHOTRIPSY (ESWL);  Surgeon: Riki Altes, MD;  Location: [facility];  Service: Urology;  Laterality: Right;  . LUMBAR PERCUTANEOUS PEDICLE SCREW 1 LEVEL N/A 01/01/2013   Procedure: lumbar three-four percutaneous pedicle screws ;  Surgeon: Reinaldo Meeker, MD;  Location: [facility];  Service: Neurosurgery;  Laterality: N/A;  lumbar three-four percutaneous pedicle screws    MEDICATIONS:  (Not in a hospital admission)   ALLERGIES:  No Known Allergies  REVIEW OF SYSTEMS:   Negative except HPI  FAMILY HISTORY:   Family History  Problem Relation Age of Onset  . Cancer Mother   . Cancer Father   . COPD Sister     SOCIAL HISTORY:   reports that he has never smoked. His smokeless tobacco use includes chew. He reports that he does not drink alcohol and does not use drugs.  PHYSICAL EXAM:  General appearance: alert, cooperative and no distress Neck: no JVD and supple, symmetrical, trachea midline Resp: clear to auscultation bilaterally Cardio: regular rate and rhythm, S1, S2 normal, no murmur, click, rub or gallop GI:  soft, non-tender; bowel sounds normal; no masses,  no organomegaly Extremities: extremities normal, atraumatic, no cyanosis or edema and Homans sign is negative, no sign of DVT Pulses: 2+ and symmetric Skin: Skin color, texture, turgor normal. No rashes or lesions Neurologic: Alert and oriented X 3, normal strength and tone. Normal symmetric reflexes. Normal coordination  and gait    LABORATORY STUDIES: No results for input(s): WBC, HGB, HCT, PLT in the last 72 hours.  No results for input(s): NA, K, CL, CO2, GLUCOSE, BUN, CREATININE, CALCIUM in the last 72 hours.  STUDIES/RESULTS:  No results found.  ASSESSMENT:  Left shoulder rotator cuff tear         Active Problems:   * No active hospital problems. *    PLAN:  Left shoulder arthroscopy with superior capsular reconstruction   Altamese Cabal 07/25/2020. 10:28 AM

## 2020-07-29 ENCOUNTER — Encounter
Admission: RE | Admit: 2020-07-29 | Discharge: 2020-07-29 | Disposition: A | Discharge status: Home / Self Care | Payer: 59 | Source: Ambulatory Visit | Attending: Orthopedic Surgery | Admitting: Orthopedic Surgery

## 2020-07-29 ENCOUNTER — Other Ambulatory Visit: Payer: Self-pay

## 2020-07-29 HISTORY — DX: Atherosclerotic heart disease of native coronary artery without angina pectoris: I25.10

## 2020-07-29 NOTE — Patient Instructions (Signed)
Your procedure is scheduled on: 08/08/20 Report to DAY SURGERY DEPARTMENT LOCATED ON 2ND FLOOR MEDICAL MALL ENTRANCE. To find out your arrival time please call [phone] between 1PM - 3PM on 08/05/20.  Remember: Instructions that are not followed completely may result in serious medical risk, up to and including death, or upon the discretion of your surgeon and anesthesiologist your surgery may need to be rescheduled.     _X__ 1. Do not eat food after midnight the night before your procedure.                 No gum chewing or hard candies. You may drink clear liquids up to 2 hours                 before you are scheduled to arrive for your surgery- DO not drink clear                 liquids within 2 hours of the start of your surgery.                 Clear Liquids include:  water, apple juice without pulp, clear carbohydrate                 drink such as Clearfast or Gatorade, Black Coffee or Tea (Do not add                 anything to coffee or tea). Diabetics water only  __X__2.  On the morning of surgery brush your teeth with toothpaste and water, you                 may rinse your mouth with mouthwash if you wish.  Do not swallow any              toothpaste of mouthwash.     _X__ 3.  No Alcohol for 24 hours before or after surgery.   _X__ 4.  Do Not Smoke or use e-cigarettes For 24 Hours Prior to Your Surgery.                 Do not use any chewable tobacco products for at least 6 hours prior to                 surgery.  ____  5.  Bring all medications with you on the day of surgery if instructed.   __X__  6.  Notify your doctor if there is any change in your medical condition      (cold, fever, infections).     Do not wear jewelry, make-up, hairpins, clips or nail polish. Do not wear lotions, powders, or perfumes.  Do not shave 48 hours prior to surgery. Men may shave face and neck. Do not bring valuables to the hospital.    [facility] is not responsible for any belongings or  valuables.  Contacts, dentures/partials or body piercings may not be worn into surgery. Bring a case for your contacts, glasses or hearing aids, a denture cup will be supplied. Leave your suitcase in the car. After surgery it may be brought to your room. For patients admitted to the hospital, discharge time is determined by your treatment team.   Patients discharged the day of surgery will not be allowed to drive home.   Please read over the following fact sheets that you were given:   MRSA Information  __X__ Take these medicines the morning of surgery with A SIP OF WATER:  1. atorvastatin (LIPITOR) 80 MG tablet  2. esomeprazole (NEXIUM) 40 MG capsule  3. ezetimibe (ZETIA) 10 MG tablet  4. gabapentin (NEURONTIN) 100 MG capsule  5. traMADol (ULTRAM) 50 MG tablet if needed  6.  ____ Fleet Enema (as directed)   __X__ Use CHG Soap/SAGE wipes as directed  ____ Use inhalers on the day of surgery  ____ Stop metformin/Janumet/Farxiga 2 days prior to surgery    ____ Take 1/2 of usual insulin dose the night before surgery. No insulin the morning          of surgery.   __X__ Stop Blood Thinners Coumadin/Plavix/Xarelto/Pleta/Pradaxa/Eliquis/Effient/Aspirin  on   Or contact your Surgeon, Cardiologist or Medical Doctor regarding  ability to stop your blood thinners  (PER DR DEW YOU CAN STOP YOUR PLAVIX AND ASPIRIN 5 DAYS BEFORE SURGERY)  __X__ Stop Anti-inflammatories 7 days before surgery such as Advil, Ibuprofen, Motrin,  BC or Goodies Powder, Naprosyn, Naproxen, Aleve, Aspirin   YOU MAY USE TYLENOL __X__ Stop all herbal supplements, fish oil or vitamin E until after surgery.    ____ Bring C-Pap to the hospital.     YOU CAN GOOGLE OR YOU TUBE HOW TO GET DRESSED WITH A SLING OR SHOULDER IMMOBILIZER.

## 2020-08-01 ENCOUNTER — Other Ambulatory Visit: Payer: Self-pay

## 2020-08-01 ENCOUNTER — Encounter
Admission: RE | Admit: 2020-08-01 | Discharge: 2020-08-01 | Disposition: A | Discharge status: Home / Self Care | Payer: No Typology Code available for payment source | Source: Ambulatory Visit | Attending: Orthopedic Surgery | Admitting: Orthopedic Surgery

## 2020-08-01 DIAGNOSIS — Z01812 Encounter for preprocedural laboratory examination: Secondary | ICD-10-CM | POA: Diagnosis present

## 2020-08-01 LAB — BASIC METABOLIC PANEL
Anion gap: 9 (ref 5–15)
BUN: 13 mg/dL (ref 8–23)
CO2: 25 mmol/L (ref 22–32)
Calcium: 8.6 mg/dL — ABNORMAL LOW (ref 8.9–10.3)
Chloride: 103 mmol/L (ref 98–111)
Creatinine, Ser: 0.8 mg/dL (ref 0.61–1.24)
GFR, Estimated: >60 mL/min (ref >60)
Glucose, Bld: 190 mg/dL — ABNORMAL HIGH (ref 70–99)
Potassium: 3.8 mmol/L (ref 3.5–5.1)
Sodium: 137 mmol/L (ref 135–145)

## 2020-08-01 LAB — CBC
HCT: 43.5 % (ref 39.0–52.0)
Hemoglobin: 14.6 g/dL (ref 13.0–17.0)
MCH: 28.4 pg (ref 26.0–34.0)
MCHC: 33.6 g/dL (ref 30.0–36.0)
MCV: 84.6 fL (ref 80.0–100.0)
Platelets: 170 10*3/uL (ref 150–400)
RBC: 5.14 MIL/uL (ref 4.22–5.81)
RDW: 14.3 % (ref 11.5–15.5)
WBC: 6.3 10*3/uL (ref 4.0–10.5)
nRBC: 0 % (ref 0.0–0.2)

## 2020-08-01 LAB — URINALYSIS, ROUTINE W REFLEX MICROSCOPIC
Bilirubin Urine: NEGATIVE
Glucose, UA: NEGATIVE mg/dL
Hgb urine dipstick: NEGATIVE
Ketones, ur: 5 mg/dL — AB
Leukocytes,Ua: NEGATIVE
Nitrite: NEGATIVE
Protein, ur: NEGATIVE mg/dL
Specific Gravity, Urine: 1.028 (ref 1.005–1.030)
pH: 5 (ref 5.0–8.0)

## 2020-08-01 LAB — PROTIME-INR
INR: 1 (ref 0.8–1.2)
Prothrombin Time: 13 seconds (ref 11.4–15.2)

## 2020-08-01 LAB — APTT: aPTT: 28 seconds (ref 24–36)

## 2020-08-01 NOTE — Progress Notes (Signed)
[facility] Perioperative Services  Pre-Admission/Anesthesia Testing Clinical Review  Date: 08/01/20  Patient Demographics:  Name: Shawn Sherman DOB:   April 20, 1956 MRN:   [id number]  Planned Surgical Procedure(s):    Case: [id number] Date/Time: 08/08/20 1426   Procedure: SHOULDER ARTHROSCOPY WITH ROTATOR CUFF REPAIR (Left )   Anesthesia type: General   Pre-op diagnosis: M75.122 Complete rotatr-cuff tear/ruptr of left shoulder, not trauma   Location: [facility] OR ROOM 03 / [facility]S FOR [facility]   Surgeons: Lyndle Herrlich, MD    NOTE: Available PAT nursing documentation and vital signs have been reviewed. Clinical nursing staff has updated patient's PMH/PSHx, current medication list, and drug allergies/intolerances to ensure comprehensive history available to assist in medical decision making as it pertains to the aforementioned surgical procedure and anticipated anesthetic course.   Clinical Discussion:  Shawn Sherman is a 65 y.o. male who is submitted for pre-surgical anesthesia review and clearance prior to him undergoing the above procedure. Patient has never been a smoker. Pertinent PMH includes: CAD, angina, carotid artery disease, HTN, HLD, prediabetes, GERD (on daily PPI therapy), OA.  Patient is followed by cardiology Kirke Corin, MD). He was last seen in the cardiology clinic on 04/22/2020; notes reviewed.  At the time of his clinic visit, patient noted to be doing well overall from a cardiovascular perspective. He denied chest pain, shortness of breath, orthopnea, PND, palpitations, peripheral edema, and presyncope/syncope.  Patient endorses intermittent vertiginous symptoms.  Patient with no carotid artery disease and is s/p BILATERAL endarterectomies.  Repeat duplex studies performed on 03/22/2020 revealed velocities consistent with a 1-39% stenosis in ICAs bilaterally.  Patient has been followed by vascular Wyn Quaker, MD) and is currently on DAPT (ASA + clopidogrel);  compliant with therapy with no evidence of GI bleeding.  PMH (+) for CAD.  Patient underwent myocardial perfusion imaging study on 09/02/2018 that demonstrated no significant evidence of ischemia or scar on perfusion imaging; LVEF 57%.  There were contiguous areas of +/-2 mm noted during stress and leads II, III, aVF, and V4-V6 (see full interpretation of cardiovascular testing below).  Patient on GDMT for his HTN and HLD diagnoses.  Blood pressure well controlled at 120/72 on currently prescribed CCB and ARB regimen.  Patient is on a statin for his HLD.  No changes were made to patient's medication regimen during this visit.  Patient to follow-up with outpatient cardiology in 4 months.  Patient scheduled to undergo elective orthopedic procedure on 08/08/2020 with Dr. Cassell Smiles.  Given patient's past medical history significant for cardiovascular disease, presurgical cardiac and vascular clearances were sought by the performing surgeon's office and PAT team.  Specialty clearances obtained as follows:   Cardiology notes that patient's RCRI is 0.9% for MACE.  Functional capacity, as defined by DASI, is documented as being 7.34 METS.  Per cardiology, "if surgery is scheduled within the next 6 months, based on ACC/AHA s guidelines, this patient would be at an overall ACCEPTABLE risk for the planned procedure without further cardiovascular testing".  Cardiology asking that recommendations regarding patient's anticoagulation therapy be directed to, and determined by, vascular surgery.   Per vascular surgery, "this patient is optimized for surgery and may proceed with the planned procedural course with a LOW risk stratification".  Again, patient is on daily DAPT (ASA + clopidogrel) therapy.  He has been instructed on recommendations from vascular surgery for holding both medications for 5 days prior to his procedure with plans to restart 1 day postoperatively as  deemed appropriate by his attending surgeon.  The  patient has been instructed that his last dose of his DAPT therapy medications will be on 08/02/2020.  He denies previous perioperative complications with anesthesia. He underwent a general anesthetic course here (ASA III) in 06/2019 with no documented complications.   Vitals with BMI 07/29/2020 04/22/2020 03/23/2020  Height 5\' 10"  5\' 10"  5\' 10"   Weight 175 lbs 176 lbs 176 lbs 2 oz  BMI 25.11 25.25 25.27  Systolic - 120 130  Diastolic - 72 82  Pulse - 82 88    Providers/Specialists:   NOTE: Primary physician provider listed below. Patient may have been seen by APP or partner within same practice.   PROVIDER ROLE / SPECIALTY LAST OV  Lyndle HerrlichBowers, James R, MD Orthopedics (Surgeon)  07/25/2020  Erasmo DownerBacigalupo, Angela M, MD Primary Care Provider  12/31/2019  Eldridge DaceArida, Muhammed, MD Cardiology  04/22/2020  Festus Barrenew, Jason, MD Vascular Surgery  03/22/2020   Allergies:  Patient has no known allergies.  Current Home Medications:   No current facility-administered medications for this encounter.   Marland Kitchen. amLODipine (NORVASC) 10 MG tablet  . aspirin 81 MG chewable tablet  . atorvastatin (LIPITOR) 80 MG tablet  . clopidogrel (PLAVIX) 75 MG tablet  . esomeprazole (NEXIUM) 40 MG capsule  . ezetimibe (ZETIA) 10 MG tablet  . gabapentin (NEURONTIN) 100 MG capsule  . losartan (COZAAR) 25 MG tablet  . Multiple Vitamins-Minerals (AIRBORNE PO)  . naproxen sodium (ALEVE) 220 MG tablet  . traMADol (ULTRAM) 50 MG tablet  . vitamin B-12 (CYANOCOBALAMIN) 500 MCG tablet   History:   Past Medical History:  Diagnosis Date  . Anginal pain (HCC) 08/2018   a. 08/2018 - atyp c/p and neg trop; b. 08/2018 MV: EF 57%, inferolateral horizontal ST depression w/o ischemia or infarct.  . Arthritis   . Carotid arterial disease (HCC)    a. 01/2019 Carotid U/S: RICA 80-90, RCCA <50, RECA>50, LICA 80-99, LCCA>50, LECA>50; b. 01/2019 CT Head/Neck: RICA string sign in bulb - lumen <271mm. Sev prox RECA dzs. LICA 60 @ dist bulb. R vert  50-70, L vert 30-50.  . Carotid bruit present 08/2018   left  . Chewing tobacco nicotine dependence   . Coronary artery disease   . Essential hypertension 03/29/2017  . GERD (gastroesophageal reflux disease)   . History of kidney stones   . Hyperlipidemia    Past Surgical History:  Procedure Laterality Date  . ANTERIOR LAT LUMBAR FUSION Right 01/01/2013   Procedure: lumbar three-four extreme lateral interbody fusion;  Surgeon: Reinaldo Meekerandy O Kritzer, MD;  Location: [facility];  Service: Neurosurgery;  Laterality: Right;  Lumbar three-four extreme lateral interbody fusion    . BACK SURGERY  08  . COLONOSCOPY    . ENDARTERECTOMY Right 02/18/2019   Procedure: ENDARTERECTOMY CAROTID;  Surgeon: Annice Needyew, Jason S, MD;  Location: [facility] ORS;  Service: Vascular;  Laterality: Right;  . ENDARTERECTOMY Left 06/11/2019   Procedure: ENDARTERECTOMY CAROTID;  Surgeon: Annice Needyew, Jason S, MD;  Location: [facility] ORS;  Service: Vascular;  Laterality: Left;  . ESOPHAGOGASTRODUODENOSCOPY (EGD) WITH PROPOFOL N/A 07/05/2015   Procedure: ESOPHAGOGASTRODUODENOSCOPY (EGD) WITH PROPOFOL;  Surgeon: Midge Miniumarren Wohl, MD;  Location: [facility] ENDOSCOPY;  Service: Endoscopy;  Laterality: N/A;  . EXTRACORPOREAL SHOCK WAVE LITHOTRIPSY Right 09/11/2018   Procedure: EXTRACORPOREAL SHOCK WAVE LITHOTRIPSY (ESWL);  Surgeon: Riki AltesStoioff, Scott C, MD;  Location: [facility] ORS;  Service: Urology;  Laterality: Right;  . LUMBAR PERCUTANEOUS PEDICLE SCREW 1 LEVEL N/A 01/01/2013   Procedure: lumbar three-four  percutaneous pedicle screws ;  Surgeon: Reinaldo Meeker, MD;  Location: [facility];  Service: Neurosurgery;  Laterality: N/A;  lumbar three-four percutaneous pedicle screws   Family History  Problem Relation Age of Onset  . Cancer Mother   . Cancer Father   . COPD Sister    Social History   Tobacco Use  . Smoking status: Never Smoker  . Smokeless tobacco: Current User    Types: Chew  Vaping Use  . Vaping Use: Never used  Substance Use Topics  . Alcohol use:  No  . Drug use: No    Pertinent Clinical Results:  LABS: Labs reviewed: Acceptable for surgery.  Hospital Outpatient Visit on 08/01/2020  Component Date Value Ref Range Status  . WBC 08/01/2020 6.3  4.0 - 10.5 K/uL Final  . RBC 08/01/2020 5.14  4.22 - 5.81 MIL/uL Final  . Hemoglobin 08/01/2020 14.6  13.0 - 17.0 g/dL Final  . HCT 15/72/6203 43.5  39.0 - 52.0 % Final  . MCV 08/01/2020 84.6  80.0 - 100.0 fL Final  . MCH 08/01/2020 28.4  26.0 - 34.0 pg Final  . MCHC 08/01/2020 33.6  30.0 - 36.0 g/dL Final  . RDW 55/97/4163 14.3  11.5 - 15.5 % Final  . Platelets 08/01/2020 170  150 - 400 K/uL Final  . nRBC 08/01/2020 0.0  0.0 - 0.2 % Final   Performed at [facility], [address] [place], [place] [address]  . Sodium 08/01/2020 137  135 - 145 mmol/L Final  . Potassium 08/01/2020 3.8  3.5 - 5.1 mmol/L Final  . Chloride 08/01/2020 103  98 - 111 mmol/L Final  . CO2 08/01/2020 25  22 - 32 mmol/L Final  . Glucose, Bld 08/01/2020 190* 70 - 99 mg/dL Final   Glucose reference range applies only to samples taken after fasting for at least 8 hours.  . BUN 08/01/2020 13  8 - 23 mg/dL Final  . Creatinine, Ser 08/01/2020 0.80  0.61 - 1.24 mg/dL Final  . Calcium 46/80/3212 8.6* 8.9 - 10.3 mg/dL Final  . GFR, Estimated 08/01/2020 >60  >60 mL/min Final   Comment: (NOTE) Calculated using the CKD-EPI Creatinine Equation (2021)   . Anion gap 08/01/2020 9  5 - 15 Final   Performed at [facility], [address] [place], [place] [address]  . Prothrombin Time 08/01/2020 13.0  11.4 - 15.2 seconds Final  . INR 08/01/2020 1.0  0.8 - 1.2 Final   Comment: (NOTE) INR goal varies based on device and disease states. Performed at [facility], [address], [place], [place] [address]   . aPTT 08/01/2020 28  24 - 36 seconds Final   Performed at [facility], [address] [place] [place], [place] [address]  . Color, Urine 08/01/2020 YELLOW* YELLOW Final  . APPearance  08/01/2020 CLEAR* CLEAR Final  . Specific Gravity, Urine 08/01/2020 1.028  1.005 - 1.030 Final  . pH 08/01/2020 5.0  5.0 - 8.0 Final  . Glucose, UA 08/01/2020 NEGATIVE  NEGATIVE mg/dL Final  . Hgb urine dipstick 08/01/2020 NEGATIVE  NEGATIVE Final  . Bilirubin Urine 08/01/2020 NEGATIVE  NEGATIVE Final  . Ketones, ur 08/01/2020 5* NEGATIVE mg/dL Final  . Protein, ur 16/94/5038 NEGATIVE  NEGATIVE mg/dL Final  . Nitrite 88/28/0034 NEGATIVE  NEGATIVE Final  . Glori Luis 08/01/2020 NEGATIVE  NEGATIVE Final   Performed at [facility], [address] [place], [place] [address]    ECG: Date: 03/23/2020 Time ECG  obtained: 1147 AM Rate: 88 bpm Rhythm: normal sinus Axis (leads I and aVF): Normal Intervals: PR 150 ms. QRS 98 ms. QTc 442 ms. ST segment and T wave changes: No evidence of acute ST segment elevation or depression Comparison: Similar to previous tracing obtained on 12/14/2019    IMAGING / PROCEDURES: DIAGNOSTIC RADIOGRAPHS RIGHT KNEE COMPLETE 4 VIEWS performed on 05/03/2020 1. 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  2. Prominent patellar spurs are noted.  3. Chondrocalcinosis is seen, greatest in the lateral compartment.  4. No joint effusion.  5. No fracture, subluxation, or dislocation.  DIAGNOSTIC RADIOGRAPHS RIGHT HIP W/WO PELVIS performed on 05/03/2020 1. Progressive moderate to severe right hip osteoarthritis. 2. Stable mild to moderate left hip osteoarthritis.  BILATERAL VASCULAR CAROTID DUPLEX performed on 03/22/2020 1. Right Carotid:   Velocities in the right ICA are consistent with a 1-39% stenosis.  Non-hemodynamically significant plaque <50% noted in the CCA.   The ECA appears <50% stenosed.   Widely patent ICA s/p CEA with no evidence of restenosis.  2. Left Carotid:   Velocities in the left ICA are consistent with a 1-39% stenosis.   Non-hemodynamically significant plaque <50% noted in the CCA.   The  ECA appears <50% stenosed.   Widely patent ICA s/p CEA with no evidence of restenosis.   CT ANGIOGRAPHY NECK W/WO CONTRAST performed on 01/26/2019 1. Bilateral vertebral artery origin stenoses estimated at 50-70% on the right and 30-50% on the left. 2. Right carotid system:   Soft plaque affecting the common carotid artery, carotid bifurcation region and ICA bulb.   Calcified plaque in the ICA bulb region.   Very severe stenosis in the ICA bulb region with string sign.   Patent lumen is almost unmeasurable, less than 1 mm.   Small size of the distal cervical ICA likely secondary to flow reduction.   Severe stenosis also of the proximal ECA on this side.   Note that the soft plaque does affect the distal common carotid artery as well. 3. Left carotid system:   Soft plaque affecting the distal common carotid artery, carotid bifurcation and ICA bulb region.   Stenosis of the distal common carotid artery with diameter as narrow as 2.5 mm.   Stenosis affecting the ICA bulb, most severe at the distal bulb level where the stenosis measures 60%.   Stenosis also affects the proximal ECA, with luminal diameter of 2 mm.  LEXISCAN performed on 09/02/2018 1. Overall left ventricular systolic function was normal 2. LVEF 57% 3. Left ventricular cavity size was normal 4. Myocardial perfusion was normal with no evidence of significant ischemia or scar 5. Horizontal ST segment depression of 2 mm was noted during stress and leads II, III, aVF, V4, V5, and V6 6. Low risk study based on the perfusion imaging, however significant ST depressions were noted during stress.  Correlate clinically.  Impression and Plan:  Shawn Sherman has been referred for pre-anesthesia review and clearance prior to him undergoing the planned anesthetic and procedural courses. Available labs, pertinent testing, and imaging results were personally reviewed by me. This patient has been appropriately cleared by both  cardiology and vascular surgery as having an ACCEPTABLE/LOW risk for significant perioperative cardiovascular complications.   Based on clinical review performed today (08/01/20), barring any significant acute changes in the patient's overall condition, it is anticipated that he will be able to proceed with the planned surgical intervention. Any acute changes in clinical condition may necessitate his procedure being  postponed and/or cancelled. Pre-surgical instructions were reviewed with the patient during his PAT appointment and questions were fielded by PAT clinical staff.  Quentin Mulling, MSN, APRN, FNP-C, CEN [facility]  Peri-operative Services Nurse Practitioner Phone: [phone] 08/01/20 11:06 AM  NOTE: This note has been prepared using Dragon dictation software. Despite my best ability to proofread, there is always the potential that unintentional transcriptional errors may still occur from this process.

## 2020-08-04 ENCOUNTER — Other Ambulatory Visit
Admission: RE | Admit: 2020-08-04 | Discharge: 2020-08-04 | Disposition: A | Discharge status: Home / Self Care | Payer: 59 | Source: Ambulatory Visit | Attending: Orthopedic Surgery | Admitting: Orthopedic Surgery

## 2020-08-04 ENCOUNTER — Other Ambulatory Visit: Payer: Self-pay

## 2020-08-04 DIAGNOSIS — U071 COVID-19: Secondary | ICD-10-CM | POA: Insufficient documentation

## 2020-08-04 DIAGNOSIS — Z01812 Encounter for preprocedural laboratory examination: Secondary | ICD-10-CM | POA: Insufficient documentation

## 2020-08-04 DIAGNOSIS — Z8616 Personal history of COVID-19: Secondary | ICD-10-CM

## 2020-08-04 HISTORY — DX: Personal history of COVID-19: Z86.16

## 2020-08-05 ENCOUNTER — Encounter: Payer: Self-pay | Admitting: Orthopedic Surgery

## 2020-08-05 LAB — SARS CORONAVIRUS 2 (TAT 6-24 HRS): SARS Coronavirus 2: POSITIVE — AB

## 2020-08-17 NOTE — Pre-Procedure Instructions (Signed)
Patient tested positive for Covid 08/04/20, surgery rescheduled for 08/22/20, phone call made to patient to review instructions and to make sure that patient has no questions. He voices that he has his instructions , soap and has no issues.

## 2020-08-22 ENCOUNTER — Encounter
Admission: RE | Disposition: A | Discharge status: Home / Self Care | Payer: Self-pay | Source: Home / Self Care | Attending: Orthopedic Surgery

## 2020-08-22 ENCOUNTER — Ambulatory Visit: Payer: No Typology Code available for payment source | Admitting: Urgent Care

## 2020-08-22 ENCOUNTER — Encounter: Payer: Self-pay | Admitting: Orthopedic Surgery

## 2020-08-22 ENCOUNTER — Ambulatory Visit
Admission: RE | Admit: 2020-08-22 | Discharge: 2020-08-22 | Disposition: A | Discharge status: Home / Self Care | Payer: No Typology Code available for payment source | Attending: Orthopedic Surgery | Admitting: Orthopedic Surgery

## 2020-08-22 ENCOUNTER — Ambulatory Visit: Payer: No Typology Code available for payment source

## 2020-08-22 ENCOUNTER — Other Ambulatory Visit: Payer: Self-pay

## 2020-08-22 DIAGNOSIS — M25512 Pain in left shoulder: Secondary | ICD-10-CM

## 2020-08-22 DIAGNOSIS — X58XXXA Exposure to other specified factors, initial encounter: Secondary | ICD-10-CM | POA: Insufficient documentation

## 2020-08-22 DIAGNOSIS — S46012A Strain of muscle(s) and tendon(s) of the rotator cuff of left shoulder, initial encounter: Secondary | ICD-10-CM | POA: Diagnosis present

## 2020-08-22 DIAGNOSIS — M7552 Bursitis of left shoulder: Secondary | ICD-10-CM | POA: Diagnosis not present

## 2020-08-22 DIAGNOSIS — S46112A Strain of muscle, fascia and tendon of long head of biceps, left arm, initial encounter: Secondary | ICD-10-CM | POA: Insufficient documentation

## 2020-08-22 HISTORY — PX: SHOULDER ARTHROSCOPY WITH ROTATOR CUFF REPAIR: SHX5685

## 2020-08-22 SURGERY — ARTHROSCOPY, SHOULDER, WITH ROTATOR CUFF REPAIR
Anesthesia: General | Site: Shoulder | Laterality: Left

## 2020-08-22 MED ORDER — EPINEPHRINE (ANAPHYLAXIS) 30 MG/30ML IJ SOLN
INTRAMUSCULAR | Status: DC | PRN
  Administered 2020-08-22: 4 mg

## 2020-08-22 MED ORDER — HYDROCODONE-ACETAMINOPHEN 7.5-325 MG PO TABS
1.0000 | ORAL_TABLET | ORAL | Status: DC | PRN
  Filled 2020-08-22: qty 2

## 2020-08-22 MED ORDER — PROPOFOL 10 MG/ML IV BOLUS
INTRAVENOUS | Status: DC | PRN
  Administered 2020-08-22: 120 mg via INTRAVENOUS

## 2020-08-22 MED ORDER — LACTATED RINGERS IV SOLN: INTRAVENOUS | Status: DC

## 2020-08-22 MED ORDER — BUPIVACAINE LIPOSOME 1.3 % IJ SUSP
INTRAMUSCULAR | Status: AC
  Filled 2020-08-22: qty 20

## 2020-08-22 MED ORDER — SUGAMMADEX SODIUM 200 MG/2ML IV SOLN
INTRAVENOUS | Status: DC | PRN
  Administered 2020-08-22: 200 mg via INTRAVENOUS

## 2020-08-22 MED ORDER — CEFAZOLIN SODIUM-DEXTROSE 2-4 GM/100ML-% IV SOLN
INTRAVENOUS | Status: AC
  Filled 2020-08-22: qty 100

## 2020-08-22 MED ORDER — ORAL CARE MOUTH RINSE: 15.0000 mL | Freq: Once | OROMUCOSAL | Status: AC

## 2020-08-22 MED ORDER — KETOROLAC TROMETHAMINE 15 MG/ML IJ SOLN: 15.0000 mg | Freq: Four times a day (QID) | INTRAMUSCULAR | Status: DC

## 2020-08-22 MED ORDER — BUPIVACAINE LIPOSOME 1.3 % IJ SUSP
INTRAMUSCULAR | Status: DC | PRN
  Administered 2020-08-22: 20 mL

## 2020-08-22 MED ORDER — ONDANSETRON HCL 4 MG/2ML IJ SOLN: 4.0000 mg | Freq: Four times a day (QID) | INTRAMUSCULAR | Status: DC | PRN

## 2020-08-22 MED ORDER — MIDAZOLAM HCL 2 MG/2ML IJ SOLN: 1.0000 mg | Freq: Once | INTRAMUSCULAR | Status: AC

## 2020-08-22 MED ORDER — ROCURONIUM BROMIDE 100 MG/10ML IV SOLN
INTRAVENOUS | Status: DC | PRN
  Administered 2020-08-22: 50 mg via INTRAVENOUS

## 2020-08-22 MED ORDER — MIDAZOLAM HCL 2 MG/2ML IJ SOLN
INTRAMUSCULAR | Status: AC
  Administered 2020-08-22: 1 mg via INTRAVENOUS
  Filled 2020-08-22: qty 2

## 2020-08-22 MED ORDER — LIDOCAINE HCL (PF) 1 % IJ SOLN
INTRAMUSCULAR | Status: AC
  Filled 2020-08-22: qty 5

## 2020-08-22 MED ORDER — ONDANSETRON HCL 4 MG PO TABS: 4.0000 mg | ORAL_TABLET | Freq: Four times a day (QID) | ORAL | Status: DC | PRN

## 2020-08-22 MED ORDER — DEXAMETHASONE SODIUM PHOSPHATE 10 MG/ML IJ SOLN
INTRAMUSCULAR | Status: DC | PRN
  Administered 2020-08-22: 10 mg via INTRAVENOUS

## 2020-08-22 MED ORDER — ONDANSETRON HCL 4 MG/2ML IJ SOLN
INTRAMUSCULAR | Status: DC | PRN
  Administered 2020-08-22: 4 mg via INTRAVENOUS

## 2020-08-22 MED ORDER — HYDROCODONE-ACETAMINOPHEN 5-325 MG PO TABS: 1.0000 | ORAL_TABLET | ORAL | Status: DC | PRN

## 2020-08-22 MED ORDER — FAMOTIDINE 20 MG PO TABS
20.0000 mg | ORAL_TABLET | Freq: Once | ORAL | Status: AC
  Administered 2020-08-22: 20 mg via ORAL

## 2020-08-22 MED ORDER — ACETAMINOPHEN 325 MG PO TABS: 325.0000 mg | ORAL_TABLET | Freq: Four times a day (QID) | ORAL | Status: DC | PRN

## 2020-08-22 MED ORDER — METOCLOPRAMIDE HCL 10 MG PO TABS: 5.0000 mg | ORAL_TABLET | Freq: Three times a day (TID) | ORAL | Status: DC | PRN

## 2020-08-22 MED ORDER — FENTANYL CITRATE (PF) 100 MCG/2ML IJ SOLN
INTRAMUSCULAR | Status: AC
  Filled 2020-08-22: qty 2

## 2020-08-22 MED ORDER — FENTANYL CITRATE (PF) 100 MCG/2ML IJ SOLN
INTRAMUSCULAR | Status: DC | PRN
  Administered 2020-08-22 (×2): 50 ug via INTRAVENOUS

## 2020-08-22 MED ORDER — MORPHINE SULFATE (PF) 2 MG/ML IV SOLN: 0.5000 mg | INTRAVENOUS | Status: DC | PRN

## 2020-08-22 MED ORDER — LIDOCAINE HCL (CARDIAC) PF 100 MG/5ML IV SOSY
PREFILLED_SYRINGE | INTRAVENOUS | Status: DC | PRN
  Administered 2020-08-22: 60 mg via INTRAVENOUS

## 2020-08-22 MED ORDER — EPINEPHRINE PF 1 MG/ML IJ SOLN
INTRAMUSCULAR | Status: AC
  Filled 2020-08-22: qty 4

## 2020-08-22 MED ORDER — CHLORHEXIDINE GLUCONATE 0.12 % MT SOLN
OROMUCOSAL | Status: AC
  Administered 2020-08-22: 15 mL via OROMUCOSAL
  Filled 2020-08-22: qty 15

## 2020-08-22 MED ORDER — BUPIVACAINE HCL (PF) 0.5 % IJ SOLN
INTRAMUSCULAR | Status: DC | PRN
  Administered 2020-08-22: 10 mL

## 2020-08-22 MED ORDER — ACETAMINOPHEN 500 MG PO TABS: 500.0000 mg | ORAL_TABLET | Freq: Four times a day (QID) | ORAL | Status: DC

## 2020-08-22 MED ORDER — FENTANYL CITRATE (PF) 100 MCG/2ML IJ SOLN: 25.0000 ug | INTRAMUSCULAR | Status: DC | PRN

## 2020-08-22 MED ORDER — METOCLOPRAMIDE HCL 5 MG/ML IJ SOLN: 5.0000 mg | Freq: Three times a day (TID) | INTRAMUSCULAR | Status: DC | PRN

## 2020-08-22 MED ORDER — FAMOTIDINE 20 MG PO TABS
ORAL_TABLET | ORAL | Status: AC
  Filled 2020-08-22: qty 1

## 2020-08-22 MED ORDER — SODIUM CHLORIDE 0.9 % IV SOLN
INTRAVENOUS | Status: DC | PRN
  Administered 2020-08-22: 30 ug/min via INTRAVENOUS

## 2020-08-22 MED ORDER — CHLORHEXIDINE GLUCONATE 0.12 % MT SOLN: 15.0000 mL | Freq: Once | OROMUCOSAL | Status: AC

## 2020-08-22 MED ORDER — HYDROCODONE-ACETAMINOPHEN 5-325 MG PO TABS: 1.0000 | ORAL_TABLET | ORAL | 0 refills | Status: DC | PRN

## 2020-08-22 MED ORDER — ONDANSETRON HCL 4 MG/2ML IJ SOLN: 4.0000 mg | Freq: Once | INTRAMUSCULAR | Status: DC | PRN

## 2020-08-22 MED ORDER — BUPIVACAINE HCL (PF) 0.5 % IJ SOLN
INTRAMUSCULAR | Status: AC
  Filled 2020-08-22: qty 10

## 2020-08-22 MED ORDER — FENTANYL CITRATE (PF) 100 MCG/2ML IJ SOLN
50.0000 ug | Freq: Once | INTRAMUSCULAR | Status: AC
  Administered 2020-08-22: 50 ug via INTRAVENOUS

## 2020-08-22 MED ORDER — PROPOFOL 10 MG/ML IV BOLUS
INTRAVENOUS | Status: AC
  Filled 2020-08-22: qty 20

## 2020-08-22 MED ORDER — CEFAZOLIN SODIUM-DEXTROSE 2-4 GM/100ML-% IV SOLN
2.0000 g | INTRAVENOUS | Status: AC
  Administered 2020-08-22: 2 g via INTRAVENOUS

## 2020-08-22 MED ORDER — PHENYLEPHRINE HCL (PRESSORS) 10 MG/ML IV SOLN
INTRAVENOUS | Status: DC | PRN
  Administered 2020-08-22 (×2): 100 ug via INTRAVENOUS
  Administered 2020-08-22: 200 ug via INTRAVENOUS

## 2020-08-22 SURGICAL SUPPLY — 77 items
ADAPTER IRRIG TUBE 2 SPIKE SOL (ADAPTER) ×4 IMPLANT
ADPR TBG 2 SPK PMP STRL ASCP (ADAPTER) ×2
ANCH SUT 2 1.8 2 SUT PASTA (Anchor) ×1 IMPLANT
ANCH SUT 2 4.75 2 THRD PEEK (Anchor) ×3 IMPLANT
ANCH SUT 2 TPE SLF PNCH SHLDR (Anchor) ×1 IMPLANT
ANCH SUT 2.8 TPE SLF PNCH (Anchor) ×1 IMPLANT
ANCHOR SUT CROSSFT 4.75 (Anchor) ×6 IMPLANT
ANCHOR Y-KNOT BLU/BLK/BLU 2 (Anchor) ×2 IMPLANT
ANCHOR Y-KNOT WTE/BLK/BLU 1.8 (Anchor) ×2 IMPLANT
ANCHOR Y-KNOT WTE/BLK/BLU 2 (Anchor) ×2 IMPLANT
APL PRP STRL LF DISP 70% ISPRP (MISCELLANEOUS) ×1
BIT DRILL Y-KNOT FLEX 1.8 (BIT) ×2 IMPLANT
BLADE FULL RADIUS 3.5 (BLADE) IMPLANT
BLADE INCISOR PLUS 4.5 (BLADE) ×2 IMPLANT
BLADE SHAVER 4.5X7 STR FR (MISCELLANEOUS) ×2 IMPLANT
BLADE SURG MINI STRL (BLADE) ×2 IMPLANT
BRUSH SCRUB EZ  4% CHG (MISCELLANEOUS) ×2
BRUSH SCRUB EZ 4% CHG (MISCELLANEOUS) ×1 IMPLANT
BUR ACROMIONIZER 4.0 (BURR) ×2 IMPLANT
BUR BR 5.5 WIDE MOUTH (BURR) IMPLANT
CANNULA SHOULDER 7CM (CANNULA) ×2 IMPLANT
CANNULA TWIST IN 8.25X7CM (CANNULA) ×4 IMPLANT
CHLORAPREP W/TINT 26 (MISCELLANEOUS) ×2 IMPLANT
COOLER POLAR GLACIER W/PUMP (MISCELLANEOUS) ×2 IMPLANT
COVER WAND RF STERILE (DRAPES) ×2 IMPLANT
DEVICE SUCT BLK HOLE OR FLOOR (MISCELLANEOUS) ×2 IMPLANT
DRAPE 3/4 80X56 (DRAPES) ×2 IMPLANT
DRAPE STERI 35X30 U-POUCH (DRAPES) ×2 IMPLANT
DRAPE U-SHAPE 47X51 STRL (DRAPES) ×2 IMPLANT
ELECT REM PT RETURN 9FT ADLT (ELECTROSURGICAL)
ELECTRODE REM PT RTRN 9FT ADLT (ELECTROSURGICAL) IMPLANT
GAUZE 4X4 16PLY RFD (DISPOSABLE) IMPLANT
GAUZE SPONGE 4X4 12PLY STRL (GAUZE/BANDAGES/DRESSINGS) ×2 IMPLANT
GAUZE XEROFORM 1X8 LF (GAUZE/BANDAGES/DRESSINGS) ×2 IMPLANT
GLOVE SRG 8 PF TXTR STRL LF DI (GLOVE) ×1 IMPLANT
GLOVE SURG ORTHO LTX SZ8 (GLOVE) ×2 IMPLANT
GLOVE SURG UNDER POLY LF SZ8 (GLOVE) ×2
GOWN STRL REUS W/ TWL LRG LVL3 (GOWN DISPOSABLE) ×1 IMPLANT
GOWN STRL REUS W/ TWL XL LVL3 (GOWN DISPOSABLE) ×1 IMPLANT
GOWN STRL REUS W/TWL LRG LVL3 (GOWN DISPOSABLE) ×2
GOWN STRL REUS W/TWL XL LVL3 (GOWN DISPOSABLE) ×2
IV LACTATED RINGER IRRG 3000ML (IV SOLUTION) ×12
IV LR IRRIG 3000ML ARTHROMATIC (IV SOLUTION) ×6 IMPLANT
KIT STABILIZATION SHOULDER (MISCELLANEOUS) ×2 IMPLANT
KIT TURNOVER KIT A (KITS) ×2 IMPLANT
MANIFOLD NEPTUNE II (INSTRUMENTS) ×6 IMPLANT
MASK FACE SPIDER DISP (MASK) ×2 IMPLANT
MAT ABSORB  FLUID 56X50 GRAY (MISCELLANEOUS) ×2
MAT ABSORB FLUID 56X50 GRAY (MISCELLANEOUS) ×1 IMPLANT
NDL SAFETY ECLIPSE 18X1.5 (NEEDLE) ×1 IMPLANT
NEEDLE HYPO 18GX1.5 SHARP (NEEDLE) ×2
NEEDLE HYPO 22GX1.5 SAFETY (NEEDLE) ×2 IMPLANT
NEEDLE MAYO 6 CRC TAPER PT (NEEDLE) ×2 IMPLANT
NEEDLE SCORPION MULTI FIRE (NEEDLE) ×2 IMPLANT
NEEDLE SPNL 18GX3.5 QUINCKE PK (NEEDLE) ×2 IMPLANT
PACK ARTHROSCOPY SHOULDER (MISCELLANEOUS) ×2 IMPLANT
PAD ABD DERMACEA PRESS 5X9 (GAUZE/BANDAGES/DRESSINGS) IMPLANT
PAD ARMBOARD 7.5X6 YLW CONV (MISCELLANEOUS) ×2 IMPLANT
PAD WRAPON POLAR SHDR XLG (MISCELLANEOUS) ×1 IMPLANT
SHEATH SHORT HANDLE 4.0 (SHEATH) ×2 IMPLANT
SLING ARM LRG DEEP (SOFTGOODS) IMPLANT
SLING ULTRA II LG (MISCELLANEOUS) ×2 IMPLANT
STRAP SAFETY 5IN WIDE (MISCELLANEOUS) ×2 IMPLANT
SUT ETHILON NAB PS2 4-0 18IN (SUTURE) ×2 IMPLANT
SUT FIBERWIRE #2 38 T-5 BLUE (SUTURE)
SUT PDS AB 0 CT1 27 (SUTURE) ×2 IMPLANT
SUT PROLENE 0 CT 2 (SUTURE) ×2 IMPLANT
SUT TIGER TAPE 7 IN WHITE (SUTURE) IMPLANT
SUTURE FIBERWR #2 38 T-5 BLUE (SUTURE) IMPLANT
SYR 10ML LL (SYRINGE) ×2 IMPLANT
SYR 50ML LL SCALE MARK (SYRINGE) ×2 IMPLANT
TAPE MICROFOAM 4IN (TAPE) ×2 IMPLANT
TISSUE ARTHOFLEX THICK 3MM (Tissue) ×2 IMPLANT
TUBING ARTHRO INFLOW-ONLY STRL (TUBING) ×2 IMPLANT
TUBING CONNECTING 10 (TUBING) ×2 IMPLANT
WAND WEREWOLF FLOW 90D (MISCELLANEOUS) ×2 IMPLANT
WRAPON POLAR PAD SHDR XLG (MISCELLANEOUS) ×2

## 2020-08-22 NOTE — H&P (Signed)
The patient has been re-examined, and the chart reviewed, and there have been no interval changes to the documented history and physical.  Plan a left shoulder superior capsular reconstruction today.  Anesthesia is consulted regarding a peripheral nerve block for post-operative pain.  The risks, benefits, and alternatives have been discussed at length, and the patient is willing to proceed.

## 2020-08-22 NOTE — Transfer of Care (Signed)
Immediate Anesthesia Transfer of Care Note  Patient: Shawn Sherman  Procedure(s) Performed: SHOULDER ARTHROSCOPY WITH ROTATOR CUFF REPAIR AND DEBRIDEMENT (Left Shoulder)  Patient Location: PACU  Anesthesia Type:General  Level of Consciousness: drowsy and patient cooperative  Airway & Oxygen Therapy: Patient Spontanous Breathing and Patient connected to face mask oxygen  Post-op Assessment: Report given to RN and Post -op Vital signs reviewed and stable  Post vital signs: Reviewed and stable  Last Vitals:  Vitals Value Taken Time  BP 100/70 08/22/20 1804  Temp    Pulse 66 08/22/20 1805  Resp 14 08/22/20 1805  SpO2 99 % 08/22/20 1805  Vitals shown include unvalidated device data.  Last Pain:  Vitals:   08/22/20 1343  TempSrc: Temporal         Complications: No complications documented.

## 2020-08-22 NOTE — Anesthesia Procedure Notes (Signed)
Procedure Name: Intubation Date/Time: 08/22/2020 3:20 PM Performed by: Omer Jack, CRNA Pre-anesthesia Checklist: Patient identified, Patient being monitored, Timeout performed, Emergency Drugs available and Suction available Patient Re-evaluated:Patient Re-evaluated prior to induction Oxygen Delivery Method: Circle system utilized Preoxygenation: Pre-oxygenation with 100% oxygen Induction Type: IV induction Ventilation: Mask ventilation without difficulty Laryngoscope Size: Miller and 2 Grade View: Grade I Tube type: Oral Tube size: 7.5 mm Number of attempts: 1 Airway Equipment and Method: Stylet Placement Confirmation: ETT inserted through vocal cords under direct vision,  positive ETCO2 and breath sounds checked- equal and bilateral Secured at: 22 cm Tube secured with: Tape Dental Injury: Teeth and Oropharynx as per pre-operative assessment

## 2020-08-22 NOTE — Op Note (Signed)
08/22/2020  5:48 PM  PATIENT:  Shawn Sherman  65 y.o. 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 male with a diagnosis of M75.122 Complete rotatr-cuff tear/ruptr of left shoulder, not trauma who failed conservative measures and elected for surgical management.    The risks benefits and alternatives were discussed with the patient preoperatively including but not limited to the risks of infection, bleeding, nerve injury, persistent pain or weakness, failure of the hardware, re-tear of the rotator cuff and the need for further surgery. Medical risks include DVT and pulmonary embolism, myocardial infarction, stroke, pneumonia, respiratory failure and death. Patient understood these risks and wished to proceed.  OPERATIVE IMPLANTS: Conmed Suture Bridge with 2 medial Y-Knot anchors and 2 lateral crossFT anchors, 2 glenoid Y-Knot anchors  OPERATIVE PROCEDURE: The patient was met in the preoperative area. The left shoulder was signed with my initials according the hospital's correct site of surgery protocol. The patient is brought to the OR and underwent a supraclavicular block and general endotracheal intubation by the anesthesia service.  The patient was placed in a beachchair position.  A spider arm positioner was used for this case. Examination under anesthesia revealed full passive ROM and a negative sulcus sign. There was anterior/posterior instability.  The patient was prepped and draped in a sterile fashion. A timeout  was performed to verify the patient's name, date of birth, medical record number, correct site of surgery and correct procedure to be performed there was also used to verify the patient received antibiotics that all appropriate instruments, implants and radiographs studies were available in the room. Once all in attendance were in agreement case began.  Bony landmarks were drawn out with a surgical marker along with proposed arthroscopy incisions. These were pre-injected with 0.25% marcaine with epi. An 11 blade was used to establish a posterior portal through which the arthroscope was placed in the glenohumeral joint. A full diagnostic examination of the shoulder was performed.  The anterior portal was established under direct visualization with an 18-gauge spinal needle.  A 5.75 mm arthroscopic cannula was placed through the anterior portal.   The intra-articular portion of the biceps tendon was found to have a partial tear involving greater than 50% of the diameter. Therefore the decision was made to perform a tenotomy. An arthroscopic wand was used to release the biceps tendon off the superior labrum. The arthroscopic shaver was then used to debride the frayed edges of the labrum. There were no anterior or superior labral tears seen.  Subscapularis tendon was intact. Patient had a full-thickness tear involving the supraspinatus and infraspinatus with retraction to the glenoid. There were no loose bodies within the inferior recess and no evidence of HAGL lesion. The rotator cuff was not repairable and the above procedure was performed.  The arthroscope was then placed in the subacromial space. A lateral portal was then established using an 18-gauge spinal needle for localization.   The greater tuberosity was debrided using a 5.5 mm resector shaver blade to remove all remaining foreign fibers of the rotator cuff.  Debridement was performed until punctate bleeding was seen at the greater tuberosity footprint,  which will allow for rotator cuff healing.  Extensive bursitis was encountered and debrided using a 4-0 resector shaver blade and a 90 ArthroCare wand from the lateral portal.   The glenoid was debrided and the bony surface prepared with the burr. Using the flexible anchor system two anchors were placed, one at the 10 O-clock position and one at the 2 O-clock position with excellent fixation. The necessary graft was measured and prepared on the back table. Horizontal stitches were placed and the graft introduced into the joint. The graft was secured medially using arthroscopic knots.   Using the a double row suture bridge system medial anchors with fiber tape were placed. The graft was mobilized and the tape passed through the graft. The tape was then crossed in usual fashion and fixated on the lateral side with two SwiveLock anchors. The final construct was stable and moved as a unit with excellent coverage of the humeral head.  All incisions were copiously irrigated. Skin closure for the arthroscopic incisions was performed with 3-0 nylon.  A dry sterile dressing including Steri-Strips was applied .  The patient was placed in an abduction sling.  All sharp and instrument counts were correct at the conclusion of the case. I was scrubbed and present for the entire case. I spoke with the patient's family in the post-op consultation room and informed them that the case had been performed without complication and the patient was stable in recovery room.   Kurtis Bushman, MD

## 2020-08-22 NOTE — Discharge Instructions (Signed)
Wear sling at all times, including sleep.  You will need to use the sling for a total of 4 weeks following surgery. ° °Do not try and lift your arm up or away from your body for any reason.  ° °Keep the dressing dry.  You may remove bandage in 3 days.  You may place Band-Aids over top of the incisions. ° °May shower once dressing is removed in 3 days.  Remove sling carefully only for showers, leaving arm down by your side while in the shower. ° °+++ Make sure to take some pain medication this evening before you fall asleep, in preparation for the nerve block wearing off in the middle of the night. ° °If the the pain medication causes itching, or is too strong, try taking a single tablet at a time, or combining with Benadryl. ° °You may be most comfortable sleeping in a recliner.  If you do sleep in near bed, placed pillows behind the shoulder that have the operation to support it. ° ° °AMBULATORY SURGERY  °DISCHARGE INSTRUCTIONS ° ° °1) The drugs that you were given will stay in your system until tomorrow so for the next 24 hours you should not: ° °A) Drive an automobile °B) Make any legal decisions °C) Drink any alcoholic beverage ° ° °2) You may resume regular meals tomorrow.  Today it is better to start with liquids and gradually work up to solid foods. ° °You may eat anything you prefer, but it is better to start with liquids, then soup and crackers, and gradually work up to solid foods. ° ° °3) Please notify your doctor immediately if you have any unusual bleeding, trouble breathing, redness and pain at the surgery site, drainage, fever, or pain not relieved by medication. ° ° ° °4) Additional Instructions: ° ° ° ° ° ° ° °Please contact your physician with any problems or Same Day Surgery at [phone], Monday through Friday 6 am to 4 pm, or [place] [facility] number at [phone]. ° ° °   Interscalene Nerve Block with Exparel ° °1.  For your surgery you have received an Interscalene Nerve Block  with Exparel. °2. Nerve Blocks affect many types of nerves, including nerves that control movement, pain and normal sensation.  You may experience feelings such as numbness, tingling, heaviness, weakness or the inability to move your arm or the feeling or sensation that your arm has "fallen asleep". °3. A nerve block with Exparel can last up to 5 days.  Usually the weakness wears off first.  The tingling and heaviness usually wear off next.  Finally you may start to notice pain.  Keep in mind that this may occur in any order.  Once a nerve block starts to wear off it is usually completely gone within 60 minutes. °4. ISNB may cause mild shortness of breath, a hoarse voice, blurry vision, unequal pupils, or drooping of the face on the same side as the nerve block.  These symptoms will usually resolve with the numbness.  Very rarely the procedure itself can cause mild seizures. °5. If needed, your surgeon will give you a prescription for pain medication.  It will take about 60 minutes for the oral pain medication to become fully effective.  So, it is recommended that you start taking this medication before the nerve block first begins to wear off, or when you first begin to feel discomfort. °6. Take your pain medication only as prescribed.  Pain medication can cause sedation   and decrease your breathing if you take more than you need for the level of pain that you have. °7. Nausea is a common side effect of many pain medications.  You may want to eat something before taking your pain medicine to prevent nausea. °8. After an Interscalene nerve block, you cannot feel pain, pressure or extremes in temperature in the effected arm.  Because your arm is numb it is at an increased risk for injury.  To decrease the possibility of injury, please practice the following: ° °a. While you are awake change the position of your arm frequently to prevent too much pressure on any one area for prolonged periods of time. °b.  If you have  a cast or tight dressing, check the color or your fingers every couple of hours.  Call your surgeon with the appearance of any discoloration (white or blue). °c. If you are given a sling to wear before you go home, please wear it  at all times until the block has completely worn off.  Do not get up at night without your sling. °d. Please contact [facility] or your surgeon if you do not begin to regain sensation after 7 days from the surgery.  Anesthesia may be contacted by calling the Same Day Surgery Department, Mon. through Fri., 6 am to 4 pm at [phone].   °e. If you experience any other problems or concerns, please contact your surgeon's office. °f. If you experience severe or prolonged shortness of breath go to the nearest emergency department. ° °

## 2020-08-22 NOTE — Anesthesia Procedure Notes (Signed)
Anesthesia Regional Block: Interscalene brachial plexus block   Pre-Anesthetic Checklist: ,, timeout performed, Correct Patient, Correct Site, Correct Laterality, Correct Procedure, Correct Position, site marked, Risks and benefits discussed,  Surgical consent,  Pre-op evaluation,  At surgeon's request and post-op pain management  Laterality: Left  Prep: chloraprep, alcohol swabs       Needles:  Injection technique: Single-shot  Needle Type: Stimiplex     Needle Length: 5cm  Needle Gauge: 22     Additional Needles:   Procedures:, nerve stimulator,,, ultrasound used (permanent image in chart),,,,   Nerve Stimulator or Paresthesia:  Response: biceps flexion, 0.5 mA,   Additional Responses:   Narrative:  Start time: 08/22/2020 2:38 PM End time: 08/22/2020 2:44 PM Injection made incrementally with aspirations every 5 mL.  Performed by: Personally  Anesthesiologist: Yves Dill, MD  Additional Notes: Functioning IV was confirmed and monitors were applied.  A 66mm 22ga Stimuplex needle was used. Sterile prep and drape,hand hygiene and sterile gloves were used.  Negative aspiration and negative test dose prior to incremental administration of local anesthetic. The patient tolerated the procedure well.  Easy injection and spread and the patient had no pain on injection.

## 2020-08-22 NOTE — Anesthesia Preprocedure Evaluation (Addendum)
Anesthesia Evaluation  Patient identified by MRN, date of birth, ID band Patient awake    Reviewed: Allergy & Precautions, H&P , NPO status , reviewed documented beta blocker date and time   Airway Mallampati: II   Neck ROM: full    Dental  (+) Chipped   Pulmonary neg pulmonary ROS,    Pulmonary exam normal        Cardiovascular hypertension, + angina + Peripheral Vascular Disease  Normal cardiovascular exam  Feb 2020  Probably normal myocardial perfusion stress test without significant ischemia or scar on perfusion imaging.  The left ventricular ejection fraction is normal (57%).  Horizontal ST segment depression ST segment depression of 2 mm was noted during stress in the II, III, aVF, V4, V5 and V6 leads.  This is a low risk study based on the perfusion imaging. However, significant ST depressions were noted during stress. Correlate clinically.   Neuro/Psych negative neurological ROS  negative psych ROS   GI/Hepatic GERD  Medicated and Controlled,  Endo/Other  negative endocrine ROS  Renal/GU Renal disease  negative genitourinary   Musculoskeletal  (+) Arthritis ,   Abdominal   Peds negative pediatric ROS (+)  Hematology negative hematology ROS (+)   Anesthesia Other Findings Past Medical History: 08/2018: Anginal pain (HCC)     Comment:  a. 08/2018 - atyp c/p and neg trop; b. 08/2018 MV: EF 57%,              inferolateral horizontal ST depression w/o ischemia or               infarct. No date: Arthritis No date: Carotid arterial disease (HCC)     Comment:  a. 01/2019 Carotid U/S: RICA 80-90, RCCA <50, RECA>50,               LICA 80-99, LCCA>50, LECA>50; b. 01/2019 CT Head/Neck:               RICA string sign in bulb - lumen <61mm. Sev prox RECA dzs.              LICA 60 @ dist bulb. R vert 50-70, L vert 30-50. 08/2018: Carotid bruit present     Comment:  left No date: Chewing tobacco nicotine  dependence 03/29/2017: Essential hypertension No date: GERD (gastroesophageal reflux disease) No date: History of kidney stones No date: Hyperlipidemia Past Surgical History: 01/01/2013: ANTERIOR LAT LUMBAR FUSION; Right     Comment:  Procedure: lumbar three-four extreme lateral interbody               fusion;  Surgeon: Reinaldo Meeker, MD;  Location: [facility]               NEUR[facility];  Service: Neurosurgery;  Laterality: Right;                Lumbar three-four extreme lateral interbody fusion   08: BACK SURGERY No date: COLONOSCOPY 02/18/2019: ENDARTERECTOMY; Right     Comment:  Procedure: ENDARTERECTOMY CAROTID;  Surgeon: Annice Needy, MD;  Location: [facility] ORS;  Service: Vascular;                Laterality: Right; 07/05/2015: ESOPHAGOGASTRODUODENOSCOPY (EGD) WITH PROPOFOL; N/A     Comment:  Procedure: ESOPHAGOGASTRODUODENOSCOPY (EGD) WITH               PROPOFOL;  Surgeon: Midge Minium, MD;  Location: [facility]  ENDOSCOPY;  Service: Endoscopy;  Laterality: N/A; 09/11/2018: EXTRACORPOREAL SHOCK WAVE LITHOTRIPSY; Right     Comment:  Procedure: EXTRACORPOREAL SHOCK WAVE LITHOTRIPSY (ESWL);              Surgeon: Riki Altes, MD;  Location: [facility] ORS;                Service: Urology;  Laterality: Right; 01/01/2013: LUMBAR PERCUTANEOUS PEDICLE SCREW 1 LEVEL; N/A     Comment:  Procedure: lumbar three-four percutaneous pedicle screws              ;  Surgeon: Reinaldo Meeker, MD;  Location: [facility]UR[facility];              Service: Neurosurgery;  Laterality: N/A;  lumbar               three-four percutaneous pedicle screws   Reproductive/Obstetrics                             Anesthesia Physical  Anesthesia Plan  ASA: III  Anesthesia Plan: General   Post-op Pain Management:  Regional for Post-op pain   Induction: Intravenous  PONV Risk Score and Plan: 2 and Dexamethasone, Midazolam and Treatment may vary due to age or medical condition  Airway  Management Planned: Oral ETT  Additional Equipment:   Intra-op Plan:   Post-operative Plan: Extubation in OR  Informed Consent: I have reviewed the patients History and Physical, chart, labs and discussed the procedure including the risks, benefits and alternatives for the proposed anesthesia with the patient or authorized representative who has indicated his/her understanding and acceptance.     Dental Advisory Given  Plan Discussed with: CRNA  Anesthesia Plan Comments: (Plavix stopped on 08/14/2020.  AN interscalene block was offered to the patient for postop pain relief.  All risks discussed which included cardiac arrest, seizures , nerve damage, infection and block not working.  The patient accepts the risks and wishes to proceed with the block.)      Anesthesia Quick Evaluation

## 2020-08-23 ENCOUNTER — Encounter: Payer: Self-pay | Admitting: Orthopedic Surgery

## 2020-08-23 ENCOUNTER — Ambulatory Visit: Payer: 59 | Admitting: Family

## 2020-08-23 DIAGNOSIS — Z9889 Other specified postprocedural states: Secondary | ICD-10-CM | POA: Insufficient documentation

## 2020-08-29 NOTE — Anesthesia Postprocedure Evaluation (Signed)
Anesthesia Post Note  Patient: Shawn Sherman  Procedure(s) Performed: SHOULDER ARTHROSCOPY WITH ROTATOR CUFF REPAIR AND DEBRIDEMENT (Left Shoulder)  Patient location during evaluation: PACU Anesthesia Type: General Level of consciousness: awake and alert and oriented Pain management: pain level controlled Vital Signs Assessment: post-procedure vital signs reviewed and stable Respiratory status: spontaneous breathing Cardiovascular status: blood pressure returned to baseline Anesthetic complications: no   No complications documented.   Last Vitals:  Vitals:   08/22/20 1845 08/22/20 1859  BP: 113/77 124/69  Pulse: 71 65  Resp: 17 16  Temp: (!) 36 C (!) 36 C  SpO2: 96% 97%    Last Pain:  Vitals:   08/23/20 0815  TempSrc:   PainSc: 1                  [name]

## 2020-09-11 NOTE — Progress Notes (Signed)
Office Visit    Patient Name: Shawn Sherman Date of Encounter: 09/12/2020  Primary Care Provider:  Erasmo Downer, MD Primary Cardiologist:  Lorine Bears, MD Electrophysiologist:  None   Chief Complaint    Shawn Sherman is a 65 y.o. male with a hx of HTN, HLD, tobacco use, carotid artery disease s/p bilateral carotid endarterectomies presents today for follow-up of hypertension  Past Medical History    Past Medical History:  Diagnosis Date  . Anginal pain (HCC) 08/2018   a. 08/2018 - atyp c/p and neg trop; b. 08/2018 MV: EF 57%, inferolateral horizontal ST depression w/o ischemia or infarct.  . Arthritis   . Carotid arterial disease (HCC)    a. 01/2019 Carotid U/S: RICA 80-90, RCCA <50, RECA>50, LICA 80-99, LCCA>50, LECA>50; b. 01/2019 CT Head/Neck: RICA string sign in bulb - lumen <23mm. Sev prox RECA dzs. LICA 60 @ dist bulb. R vert 50-70, L vert 30-50.  . Carotid bruit present 08/2018   left  . Chewing tobacco nicotine dependence   . Coronary artery disease   . Essential hypertension 03/29/2017  . GERD (gastroesophageal reflux disease)   . History of 2019 novel coronavirus disease (COVID-19) 08/04/2020  . History of kidney stones   . Hyperlipidemia    Past Surgical History:  Procedure Laterality Date  . ANTERIOR LAT LUMBAR FUSION Right 01/01/2013   Procedure: lumbar three-four extreme lateral interbody fusion;  Surgeon: Reinaldo Meeker, MD;  Location: [facility];  Service: Neurosurgery;  Laterality: Right;  Lumbar three-four extreme lateral interbody fusion    . BACK SURGERY  08  . COLONOSCOPY    . ENDARTERECTOMY Right 02/18/2019   Procedure: ENDARTERECTOMY CAROTID;  Surgeon: Annice Needy, MD;  Location: [facility] ORS;  Service: Vascular;  Laterality: Right;  . ENDARTERECTOMY Left 06/11/2019   Procedure: ENDARTERECTOMY CAROTID;  Surgeon: Annice Needy, MD;  Location: [facility] ORS;  Service: Vascular;  Laterality: Left;  . ESOPHAGOGASTRODUODENOSCOPY (EGD) WITH PROPOFOL N/A  07/05/2015   Procedure: ESOPHAGOGASTRODUODENOSCOPY (EGD) WITH PROPOFOL;  Surgeon: Midge Minium, MD;  Location: [facility] ENDOSCOPY;  Service: Endoscopy;  Laterality: N/A;  . EXTRACORPOREAL SHOCK WAVE LITHOTRIPSY Right 09/11/2018   Procedure: EXTRACORPOREAL SHOCK WAVE LITHOTRIPSY (ESWL);  Surgeon: Riki Altes, MD;  Location: [facility]S;  Service: Urology;  Laterality: Right;  . LUMBAR PERCUTANEOUS PEDICLE SCREW 1 LEVEL N/A 01/01/2013   Procedure: lumbar three-four percutaneous pedicle screws ;  Surgeon: Reinaldo Meeker, MD;  Location: [facility];  Service: Neurosurgery;  Laterality: N/A;  lumbar three-four percutaneous pedicle screws  . SHOULDER ARTHROSCOPY WITH ROTATOR CUFF REPAIR Left 08/22/2020   Procedure: SHOULDER ARTHROSCOPY WITH ROTATOR CUFF REPAIR AND DEBRIDEMENT;  Surgeon: Lyndle Herrlich, MD;  Location: [facility] ORS;  Service: Orthopedics;  Laterality: Left;    Allergies  No Known Allergies  History of Present Illness    Shawn Sherman is a 65 y.o. male with a hx of HTN, HLD, tobacco use, carotid artery disease s/p bilateral carotid endarterectomies. He was last seen 04/22/20.  Seen 08/2018 for chest pain and noted to have left carotid bruit. Treadmill myoview with ST depression with exercise but reports on symptoms and perfusion imaging was normal. He underwent bilateral carotid endarterectomies 01/2019 with Dr. Wyn Quaker of [facility] for severe bilateral carotid stenosis.   Seen in clinic 12/27/2019 was noted to be out of his amlodipine for 6 months. His blood pressure was elevated and started amlodipine 10 mg daily was resumed. He was also recommended  for the addition of Zetia as lipid panel 12/14/19 with LDL 96.   Seen in follow-up by Leafy Kindle, PA. Repeat lab work 03/18/2020 with total cholesterol 143, HDL 51, LDL 70. He was started on losartan 12.5 mg daily as his BP was not at goal. He was seen in follow up 04/22/20. His BP was at goal after addition of Losartan. He was awaiting shoulder surgery.  Underwent shoulder athroplasty 08/22/20.  Presents today for follow-up.  Endorses feeling overall well since his shoulder surgery.  He has been working with outpatient physical therapy twice per week and doing exercises at home.  Pain is reasonably well controlled with Tylenol.  He denies chest pain, pressure, tightness.  Reports no shortness of breath at rest nor dyspnea on exertion.  We reviewed that his heart rate today is elevated with EKG showing sinus tachycardia 110 bpm.  He denies palpitations.  Tells me he has been feeling "hot" over the last few days.  Reports no dizziness no near syncope and only an occasional lightheadedness sensation when he changes position too quickly.  EKGs/Labs/Other Studies Reviewed:   The following studies were reviewed today:  Carotid duplex 03/2020 Summary:  Right Carotid: Velocities in the right ICA are consistent with a 1-39%  stenosis.                Non-hemodynamically significant plaque <50% noted in the  CCA. The                 ECA appears <50% stenosed. Widely patent ICA s/p CEA with  no                evidence of restenosis.   Left Carotid: Velocities in the left ICA are consistent with a 1-39%  stenosis.               Non-hemodynamically significant plaque <50% noted in the  CCA. The                ECA appears <50% stenosed. Widely patent ICA s/p CEA with no                evidence of restenosis.   Vertebrals:  Bilateral vertebral arteries demonstrate antegrade flow.  Subclavians: Normal flow hemodynamics were seen in bilateral subclavian               arteries.   Carotid duplex 09/15/19 Summary:  Right Carotid: The ECA appears >50% stenosed.   Left Carotid: The ECA appears >50% stenosed.   Vertebrals:  Bilateral vertebral arteries demonstrate antegrade flow.  Subclavians: Normal flow hemodynamics were seen in bilateral subclavian arteries.    EKG: EKG ordered today.  EKG performed today demonstrates sinus tachycardia 110 bpm.  No  acute ST/T wave changes.  Recent Labs: 12/14/2019: ALT 21 08/01/2020: BUN 13; Creatinine, Ser 0.80; Hemoglobin 14.6; Platelets 170; Potassium 3.8; Sodium 137  Recent Lipid Panel    Component Value Date/Time   CHOL 143 03/23/2020 1230   TRIG 124 03/23/2020 1230   HDL 51 03/23/2020 1230   CHOLHDL 2.8 03/23/2020 1230   CHOLHDL 2.7 04/19/2017 1143   LDLCALC 70 03/23/2020 1230   LDLCALC 73 04/19/2017 1143   Home Medications   Current Meds  Medication Sig  . amLODipine (NORVASC) 10 MG tablet Take 1 tablet (10 mg total) by mouth daily.  Marland Kitchen aspirin 81 MG chewable tablet Chew 81 mg by mouth daily.  Marland Kitchen atorvastatin (LIPITOR) 80 MG tablet TAKE 1 TABLET BY MOUTH ONCE DAILY  .  clopidogrel (PLAVIX) 75 MG tablet TAKE 1 TABLET BY MOUTH ONCE DAILY WITH BREAKFAST  . esomeprazole (NEXIUM) 40 MG capsule TAKE 1 CAPSULE BY MOUTH ONCE EVERY EVENING  . ezetimibe (ZETIA) 10 MG tablet Take 1 tablet (10 mg total) by mouth daily.  Marland Kitchen gabapentin (NEURONTIN) 100 MG capsule TAKE 1 CAPSULE BY MOUTH 3 TIMES DAILY  . HYDROcodone-acetaminophen (NORCO/VICODIN) 5-325 MG tablet Take 1 tablet by mouth every 4 (four) hours as needed for moderate pain.  Marland Kitchen losartan (COZAAR) 25 MG tablet Take 0.5 tablets (12.5 mg total) by mouth daily.  . Multiple Vitamins-Minerals (AIRBORNE PO) Take 1 tablet by mouth daily.  . vitamin B-12 (CYANOCOBALAMIN) 500 MCG tablet Take 500 mcg by mouth daily.     Review of Systems   All other systems reviewed and are otherwise negative except as noted above.  Physical Exam   VS:  BP 104/70 (BP Location: Right Arm, Patient Position: Sitting, Cuff Size: Large)   Pulse (!) 110   Ht 5\' 10"  (1.778 m)   Wt 178 lb (80.7 kg)   SpO2 97%   BMI 25.54 kg/m  , BMI Body mass index is 25.54 kg/m. GEN: Well nourished, well developed, in no acute distress. HEENT: normal. Neck: Supple, no JVD, carotid bruits, or masses. Cardiac: RRR, tachycardic, no murmurs, rubs, or gallops. No clubbing, cyanosis, edema.   Radials/DP/PT 2+ and equal bilaterally.  Respiratory:  Respirations regular and unlabored, clear to auscultation bilaterally. GI: Soft, nontender, nondistended MS: No deformity or atrophy. Skin: Warm and dry, no rash.   No erythema nor swelling. Neuro:  Strength and sensation are intact. Psych: Normal affect.  Assessment & Plan    1. Tachycardia / Palpitations -EKG today shows sinus tachycardia 110 bpm.  Recheck with manual heart rate by provider with rate 120 bpm.  Reports occasional palpitations and sensation of feeling "hot ".  TSH, BMP, magnesium today to rule out thyroid or electrolyte abnormalities contributory.  7-day ZIO placed to rule out SVT or alternate arrhythmia. Pending results consider addition of Metoprolol though we may need to discontinue losartan to allow in room for blood pressure.  Mildly hypotensive today likely secondary to tachycardia though asymptomatic.  We discussed that pain from his recent shoulder surgery to be sure that he endorses that his pain has been overall well controlled with Tylenol.  2. HTN - BP well controlled. Continue current antihypertensive regimen including losartan 12.5 mg daily, amlodipine 10 mg daily.  3. HLD, LDL goal <70 - 03/2020 LDL 70. Continue Atorvastatin 80mg  daily, Zetia 10mg  daily. Recommend lipid-lowering diet.  4. Tobacco use - Continues to chew.  Cessation encouraged.  5. Bilateral carotid artery stenosis s/p bilateral carotid endarterectomy - Continue to follow with Dr. 06-28-1979 of [facility]. 03/2020 carotid duplex with bilateral 1 to 39% stenosis.  Disposition: Follow up in 6 month(s) with Dr. or APP.  Wyn Quaker, NP 09/12/2020, 11:56 AM

## 2020-09-12 ENCOUNTER — Encounter: Payer: Self-pay | Admitting: Family

## 2020-09-12 ENCOUNTER — Other Ambulatory Visit: Payer: Self-pay

## 2020-09-12 ENCOUNTER — Ambulatory Visit (INDEPENDENT_AMBULATORY_CARE_PROVIDER_SITE_OTHER): Payer: 59 | Admitting: Family

## 2020-09-12 ENCOUNTER — Ambulatory Visit (INDEPENDENT_AMBULATORY_CARE_PROVIDER_SITE_OTHER): Payer: 59

## 2020-09-12 VITALS — BP 104/70 | HR 110 | Ht 70.0 in | Wt 178.0 lb

## 2020-09-12 DIAGNOSIS — R000 Tachycardia, unspecified: Secondary | ICD-10-CM

## 2020-09-12 DIAGNOSIS — R002 Palpitations: Secondary | ICD-10-CM | POA: Diagnosis not present

## 2020-09-12 DIAGNOSIS — I10 Essential (primary) hypertension: Secondary | ICD-10-CM

## 2020-09-12 DIAGNOSIS — E785 Hyperlipidemia, unspecified: Secondary | ICD-10-CM | POA: Diagnosis not present

## 2020-09-12 NOTE — Patient Instructions (Addendum)
Medication Instructions:  No medication changes today.   *If you need a refill on your cardiac medications before your next appointment, please call your pharmacy*  Lab Work: Your provider recommends lab work today; BMP, magnesium, TSH  If you have labs (blood work) drawn today and your tests are completely normal, you will receive your results only by: Marland Kitchen MyChart Message (if you have MyChart) OR . A paper copy in the mail If you have any lab test that is abnormal or we need to change your treatment, we will call you to review the results.   Testing/Procedures: Your EKG today showed sinus tachycardia which is a fast but regular heart beat.  Your physician has recommended that you wear a Zio monitor. This monitor is a medical device that records the heart's electrical activity. Doctors most often use these monitors to diagnose arrhythmias. Arrhythmias are problems with the speed or rhythm of the heartbeat. The monitor is a small device applied to your chest. You can wear one while you do your normal daily activities. While wearing this monitor if you have any symptoms to push the button and record what you felt. Once you have worn this monitor for 5-7 days, you will return the monitor device in the postage paid box. Once it is returned they will download the data collected and provide [place] with a report which the provider will then review and we will call you with those results. Important tips:  1. Avoid showering during the first 24 hours of wearing the monitor. 2. Avoid excessive sweating to help maximize wear time. 3. Do not submerge the device, no hot tubs, and no swimming pools. 4. Keep any lotions or oils away from the patch. 5. After 24 hours you may shower with the patch on. Take brief showers with your back facing the shower head.  6. Do not remove patch once it has been placed because that will interrupt data and decrease adhesive wear time. 7. Push the button when you have any  symptoms and write down what you were feeling. 8. Once you have completed wearing your monitor, remove and place into box which has postage paid and place in your outgoing mailbox.  9. If for some reason you have misplaced your box then call our office and we can provide another box and/or mail it off for you.       Follow-Up: At [facility], you and your health needs are our priority.  As part of our continuing mission to provide you with exceptional heart care, we have created designated Provider Care Teams.  These Care Teams include your primary Cardiologist (physician) and Advanced Practice Providers (APPs -  Physician Assistants and Nurse Practitioners) who all work together to provide you with the care you need, when you need it.  We recommend signing up for the patient portal called "MyChart".  Sign up information is provided on this After Visit Summary.  MyChart is used to connect with patients for Virtual Visits (Telemedicine).  Patients are able to view lab/test results, encounter notes, upcoming appointments, etc.  Non-urgent messages can be sent to your provider as well.   To learn more about what you can do with MyChart, go to [email or web address].    Your next appointment:   6 month(s)  The format for your next appointment:   In Person  Provider:   You may see Lorine Bears, MD or one of the following Advanced Practice Providers on your designated Care Team:  Nicolasa Ducking, NP  Eula Listen, PA-C  Marisue Ivan, PA-C  Cadence Fransico Michael, PA-C  Gillian Shields, NP  Other Instructions  Heart Healthy Diet Recommendations: A low-salt diet is recommended. Meats should be grilled, baked, or boiled. Avoid fried foods. Focus on lean protein sources like fish or chicken with vegetables and fruits. The American Heart Association is a Chief Technology Officer!  American Heart Association Diet and Lifeystyle Recommendations   Exercise recommendations: The American Heart  Association recommends 150 minutes of moderate intensity exercise weekly. Try 30 minutes of moderate intensity exercise 4-5 times per week. This could include walking, jogging, or swimming.

## 2020-09-13 LAB — BASIC METABOLIC PANEL
BUN/Creatinine Ratio: 15 (ref 10–24)
BUN: 12 mg/dL (ref 8–27)
CO2: 18 mmol/L — ABNORMAL LOW (ref 20–29)
Calcium: 8.9 mg/dL (ref 8.6–10.2)
Chloride: 102 mmol/L (ref 96–106)
Creatinine, Ser: 0.79 mg/dL (ref 0.76–1.27)
Glucose: 257 mg/dL — ABNORMAL HIGH (ref 65–99)
Potassium: 4.1 mmol/L (ref 3.5–5.2)
Sodium: 137 mmol/L (ref 134–144)
eGFR: 99 mL/min/{1.73_m2} (ref >59)

## 2020-09-13 LAB — MAGNESIUM: Magnesium: 1.8 mg/dL (ref 1.6–2.3)

## 2020-09-13 LAB — TSH: TSH: 2.4 u[IU]/mL (ref 0.450–4.500)

## 2020-09-22 ENCOUNTER — Other Ambulatory Visit: Payer: Self-pay | Admitting: Family Medicine

## 2020-09-22 DIAGNOSIS — M7061 Trochanteric bursitis, right hip: Secondary | ICD-10-CM

## 2020-10-06 ENCOUNTER — Telehealth: Payer: Self-pay | Admitting: *Deleted

## 2020-10-06 NOTE — Telephone Encounter (Signed)
-----   Message from Alver Sorrow, NP sent at 10/05/2020  8:27 AM EDT ----- Heart rate showed predominantly normal sinus rhythm which is a good result. Rare early beats called PVC/PAC which are not dangerous and not of concern. He had one episode of a fast heart beat called SVT which only lasted 4 beats and not of concern. This is a good result! No significant nor dangerous arrhythmia. Triggered episode was associated with NSR.

## 2020-10-06 NOTE — Telephone Encounter (Signed)
Attempted to call pt to review results. No answer. Lmtcb.  

## 2020-10-24 ENCOUNTER — Other Ambulatory Visit: Payer: Self-pay | Admitting: Family Medicine

## 2020-10-24 DIAGNOSIS — K209 Esophagitis, unspecified without bleeding: Secondary | ICD-10-CM

## 2020-10-27 NOTE — Telephone Encounter (Signed)
Pt notified, see result note 10/19/20.

## 2020-12-13 ENCOUNTER — Other Ambulatory Visit: Payer: Self-pay | Admitting: Family

## 2020-12-13 ENCOUNTER — Other Ambulatory Visit: Payer: Self-pay

## 2020-12-13 DIAGNOSIS — I10 Essential (primary) hypertension: Secondary | ICD-10-CM

## 2020-12-13 MED ORDER — AMLODIPINE BESYLATE 10 MG PO TABS: 10.0000 mg | ORAL_TABLET | Freq: Every day | ORAL | 0 refills | Status: DC

## 2020-12-31 IMAGING — CR DG HIP (WITH OR WITHOUT PELVIS) 2-3V*R*
1 series · 3 of 3 positions shown · non-contrast
Comparison: 12/26/2018

CLINICAL DATA: Right hip pain, history of trochanteric bursitis,
pain with ambulation

EXAM:
DG HIP (WITH OR WITHOUT PELVIS) 2-3V RIGHT

[Series 1: dg hip unilat w or w/o pelvis 2-3 views  · non-contrast · 0.14mm/px · 3 of 3 slices shown]
[im 1/3]
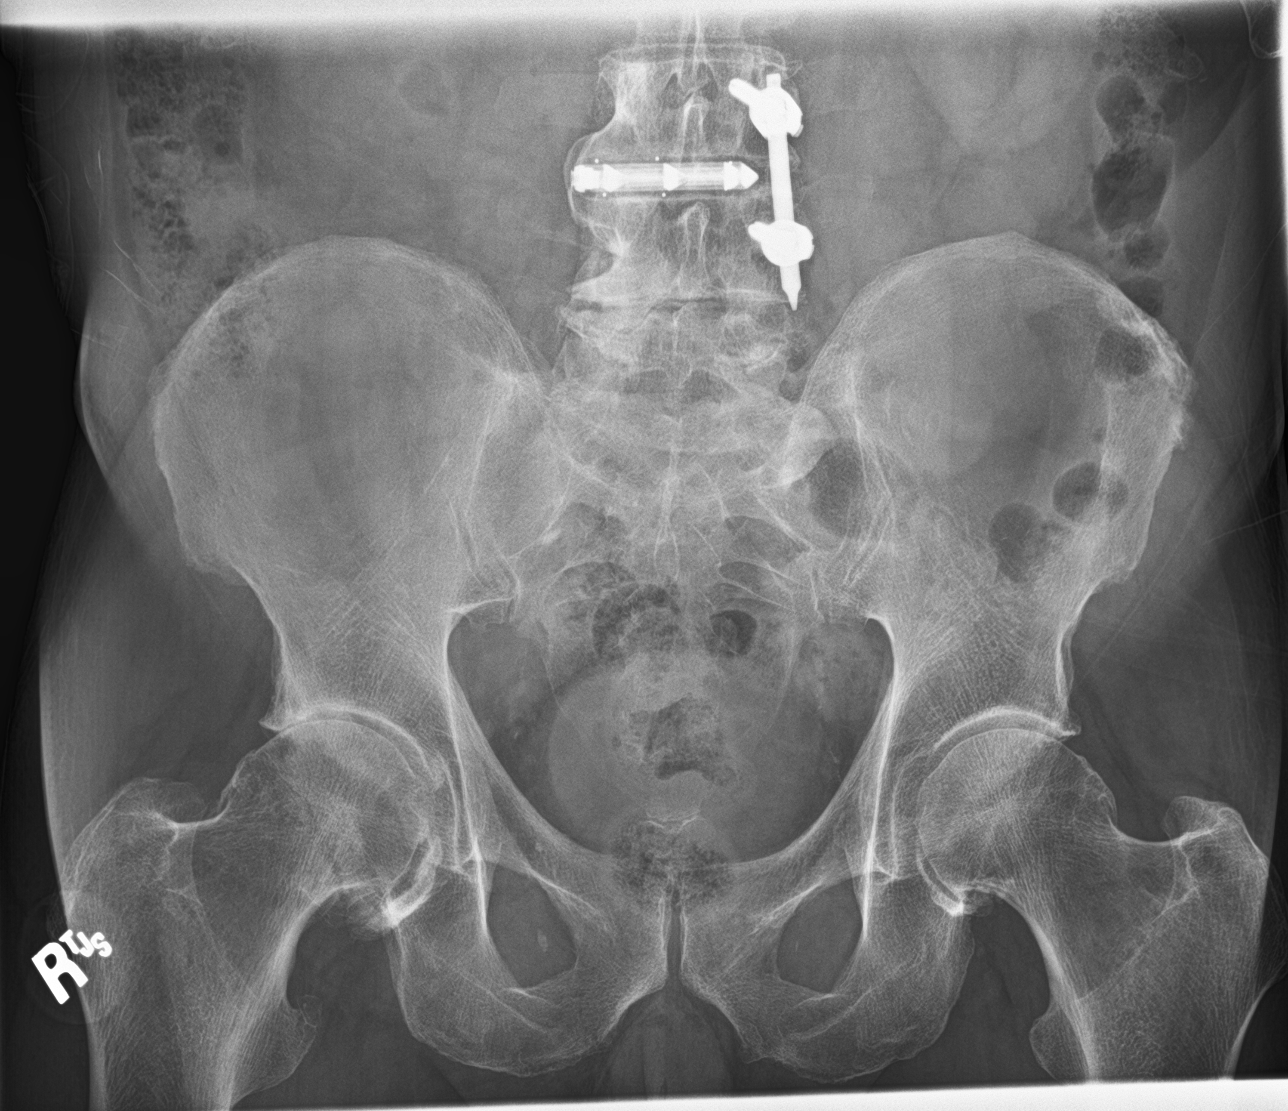
[im 2/3]
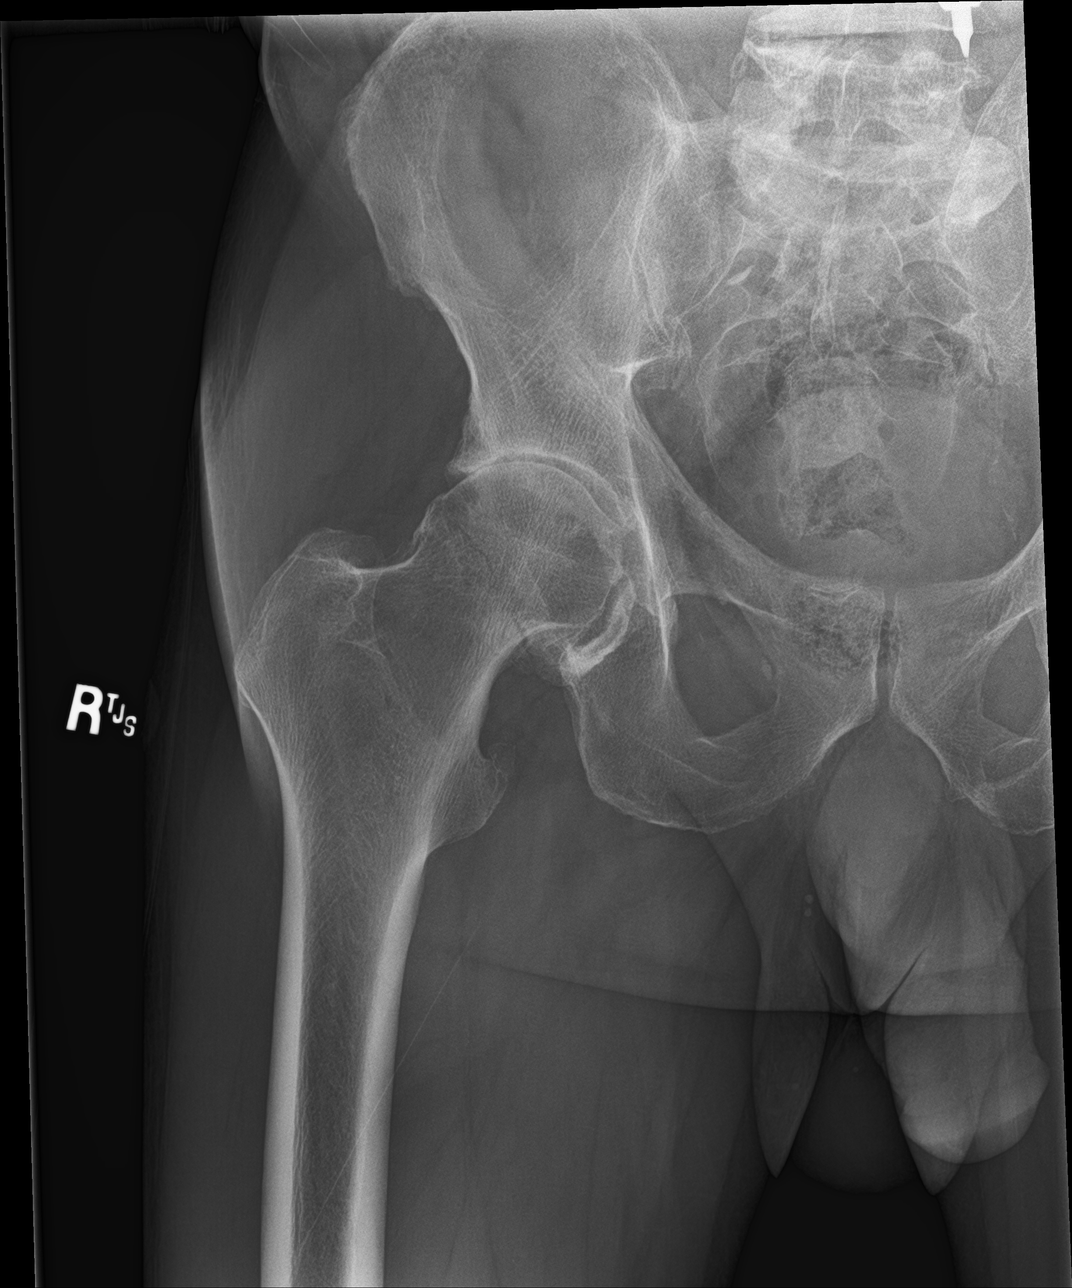
[im 3/3]
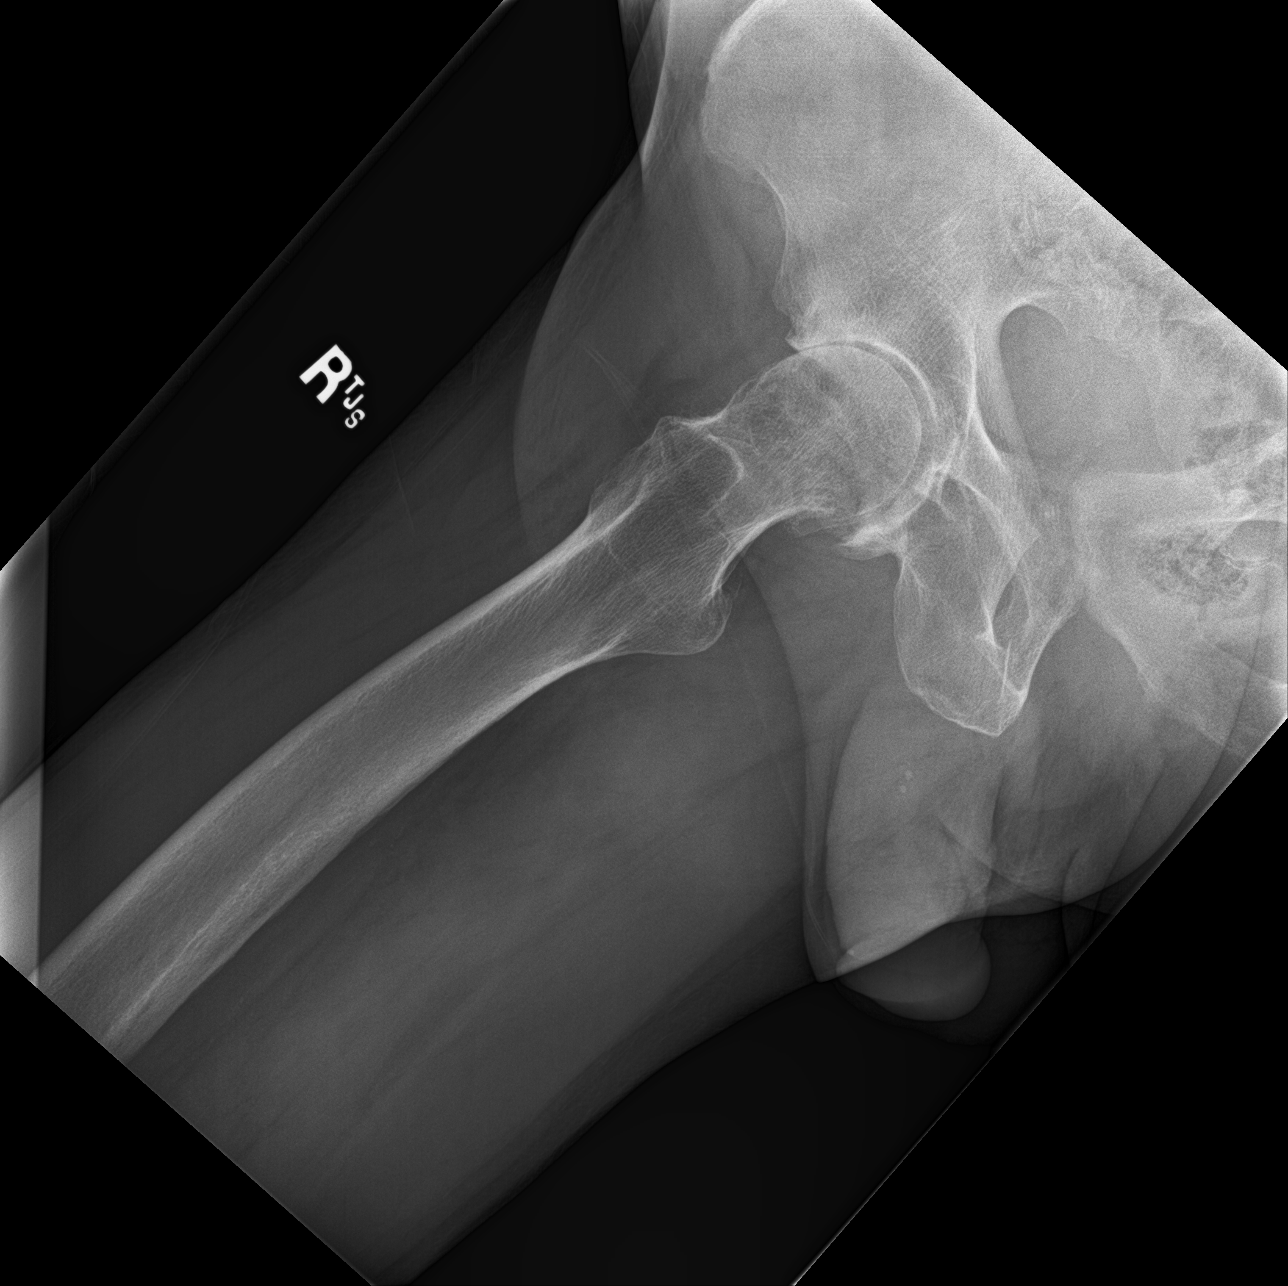

[3 of 3 positions shown; findings below may reference images not displayed]

FINDINGS: Frontal view of the pelvis as well as frontal and frogleg lateral
views of the right hip are obtained. There is progressive moderate
to severe right hip osteoarthritis with marked joint space narrowing
and circumferential osteophyte formation. Stable mild to moderate
left hip osteoarthritis is noted. No fracture, subluxation, or
dislocation. Sacroiliac joints are normal. Stable postsurgical
changes at the L3/L4 level.
IMPRESSION: 1. Progressive moderate to severe right hip osteoarthritis.
2. Stable mild to moderate left hip osteoarthritis.

## 2020-12-31 IMAGING — CR DG KNEE COMPLETE 4+V*R*
1 series · 4 of 4 positions shown · non-contrast
Comparison: 06/25/2011

CLINICAL DATA: Right knee pain and stiffness

EXAM:
RIGHT KNEE - COMPLETE 4+ VIEW

[Series 1: dg knee complete 4 views right · 0.14mm/px · 4 of 4 slices shown]
[im 1/4]
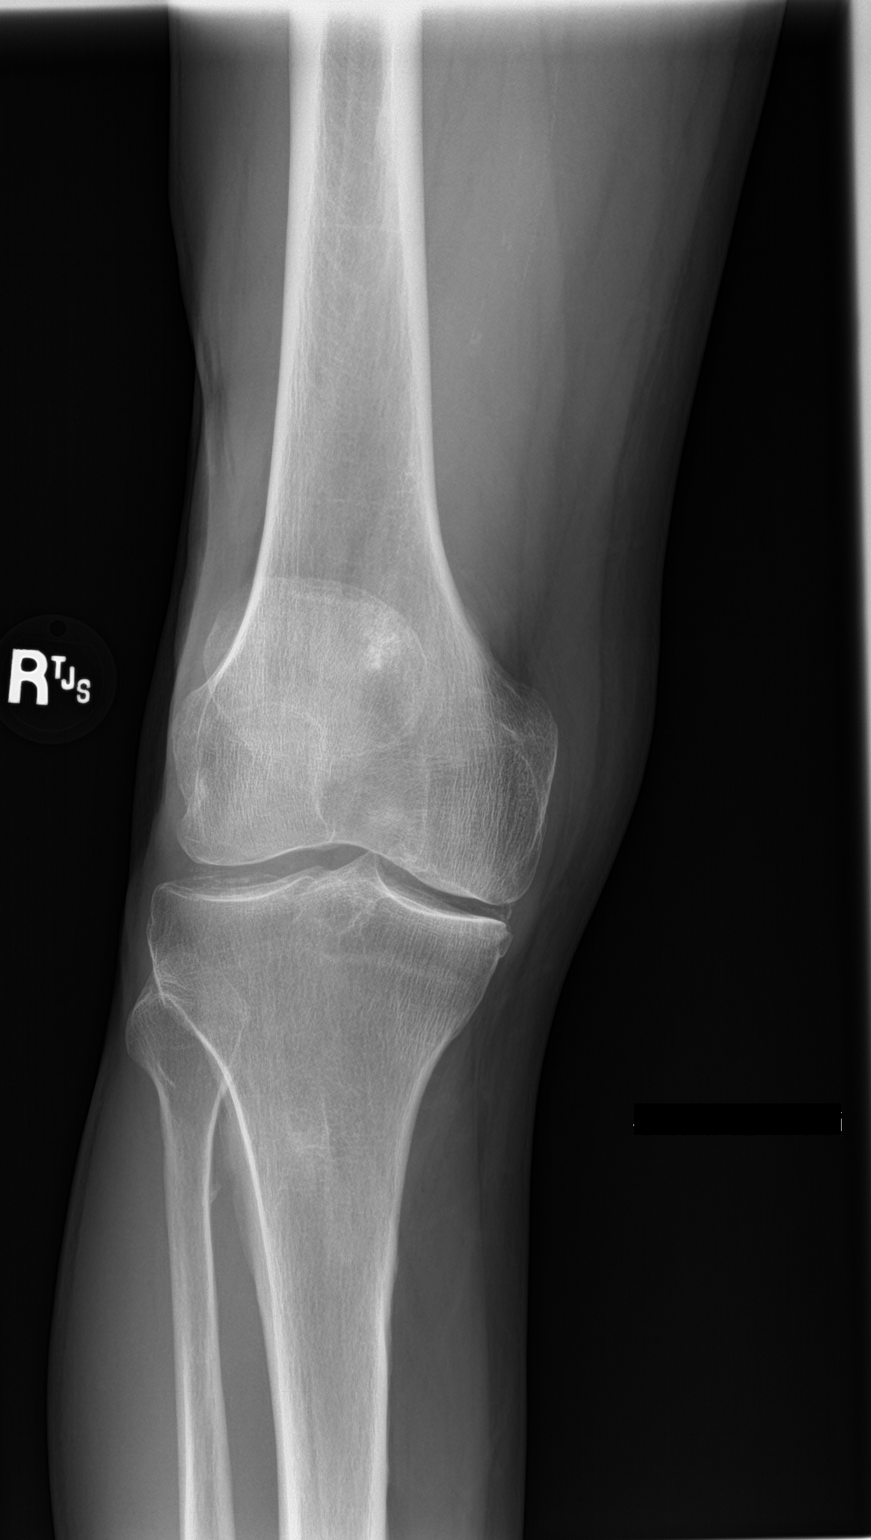
[im 2/4]
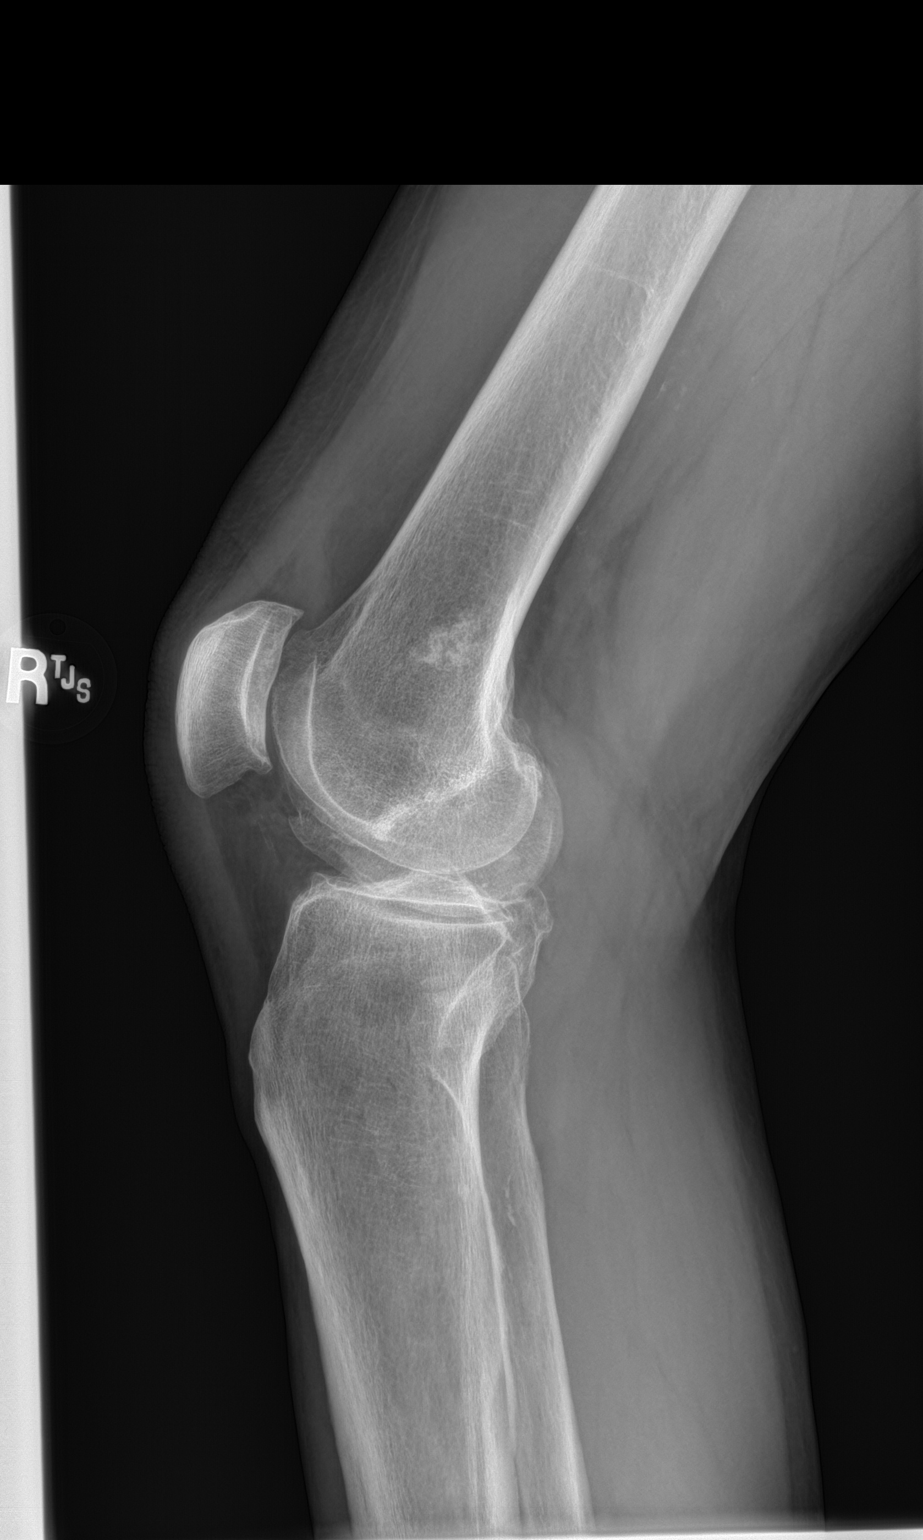
[im 3/4]
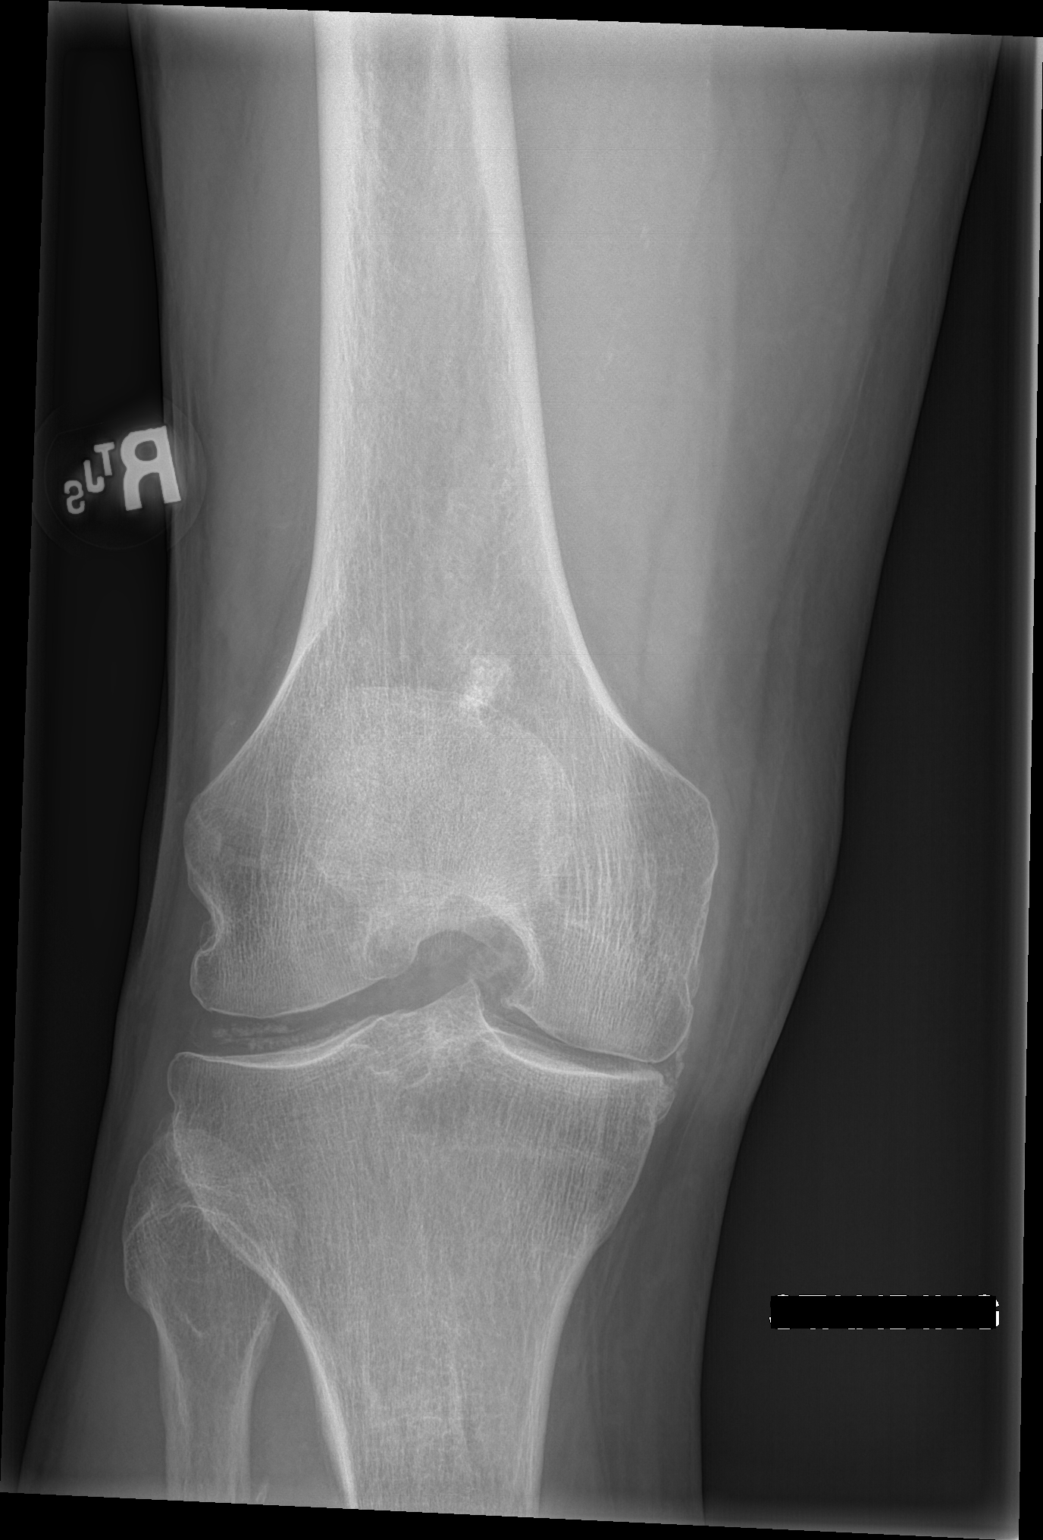
[im 4/4]
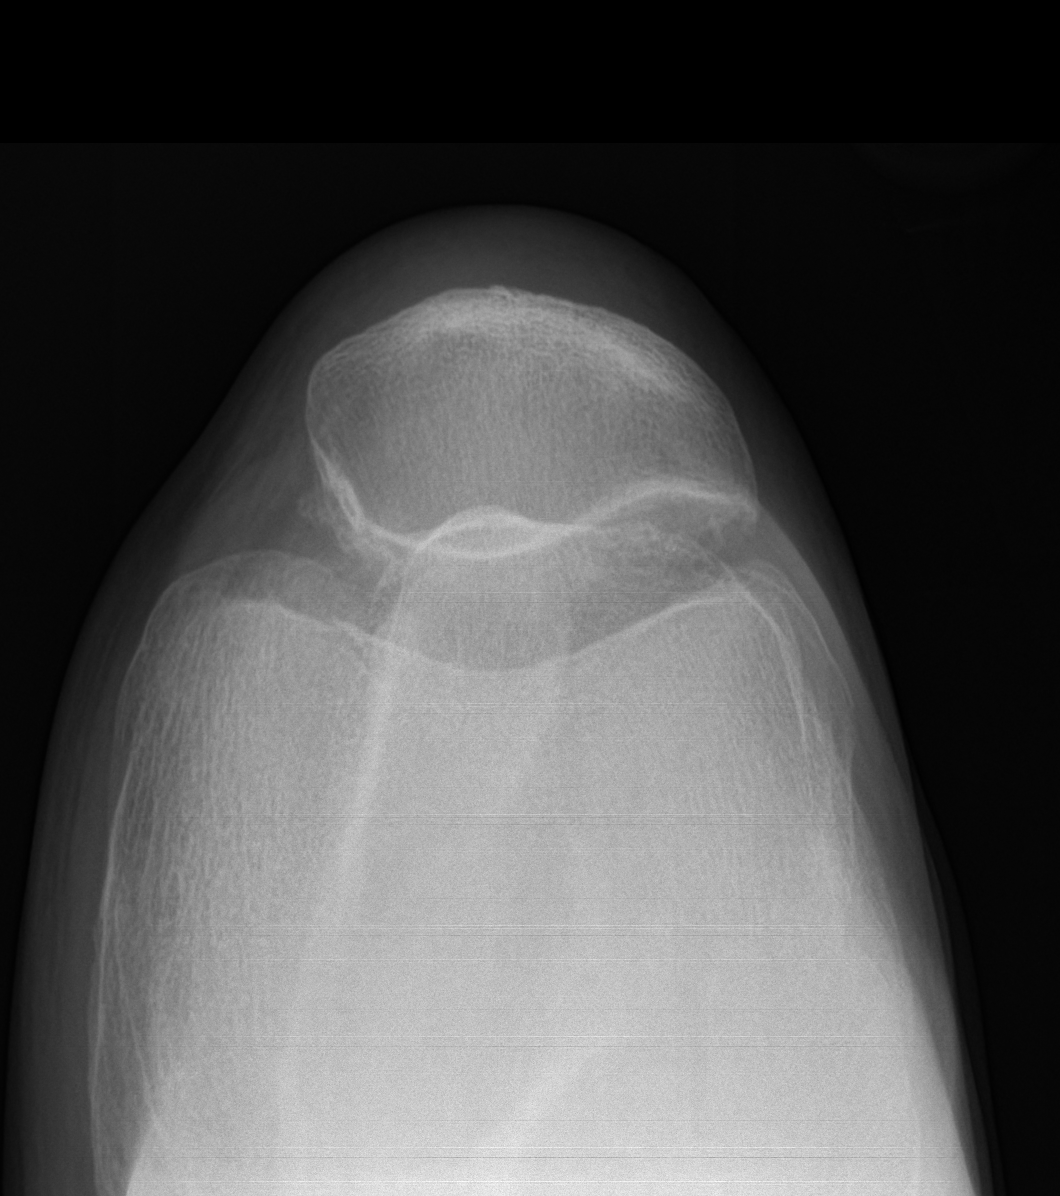

[4 of 4 positions shown; findings below may reference images not displayed]

FINDINGS: Frontal, tunnel, lateral, and sunrise views of the right knee are
obtained. There is 3 compartmental osteoarthritis most pronounced in
the medial compartment. Prominent patellar spurs are noted.
Chondrocalcinosis is seen, greatest in the lateral compartment. No
joint effusion. No fracture, subluxation, or dislocation.
IMPRESSION: 1. Moderate 3 compartmental osteoarthritis greatest in the medial
compartment.
2. No acute bony abnormality.

## 2021-01-16 ENCOUNTER — Other Ambulatory Visit: Payer: Self-pay | Admitting: Family

## 2021-01-23 ENCOUNTER — Other Ambulatory Visit: Payer: Self-pay | Admitting: Family Medicine

## 2021-01-23 DIAGNOSIS — M7061 Trochanteric bursitis, right hip: Secondary | ICD-10-CM

## 2021-01-26 ENCOUNTER — Emergency Department: Payer: 59

## 2021-01-26 ENCOUNTER — Inpatient Hospital Stay: Payer: 59 | Admitting: Anesthesiology

## 2021-01-26 ENCOUNTER — Observation Stay
Admission: EM | Admit: 2021-01-26 | Discharge: 2021-01-27 | Disposition: A | Discharge status: Home / Self Care | Payer: 59 | Attending: Internal Medicine | Admitting: Internal Medicine

## 2021-01-26 ENCOUNTER — Other Ambulatory Visit: Payer: Self-pay

## 2021-01-26 ENCOUNTER — Encounter: Payer: Self-pay | Admitting: Intensive Care

## 2021-01-26 ENCOUNTER — Encounter
Admission: EM | Disposition: A | Discharge status: Home / Self Care | Payer: Self-pay | Source: Home / Self Care | Attending: Emergency Medicine

## 2021-01-26 DIAGNOSIS — I10 Essential (primary) hypertension: Secondary | ICD-10-CM | POA: Diagnosis not present

## 2021-01-26 DIAGNOSIS — Z7982 Long term (current) use of aspirin: Secondary | ICD-10-CM | POA: Diagnosis not present

## 2021-01-26 DIAGNOSIS — N390 Urinary tract infection, site not specified: Secondary | ICD-10-CM

## 2021-01-26 DIAGNOSIS — N200 Calculus of kidney: Secondary | ICD-10-CM

## 2021-01-26 DIAGNOSIS — N179 Acute kidney failure, unspecified: Secondary | ICD-10-CM

## 2021-01-26 DIAGNOSIS — N138 Other obstructive and reflux uropathy: Secondary | ICD-10-CM

## 2021-01-26 DIAGNOSIS — Z20822 Contact with and (suspected) exposure to covid-19: Secondary | ICD-10-CM | POA: Insufficient documentation

## 2021-01-26 DIAGNOSIS — R739 Hyperglycemia, unspecified: Secondary | ICD-10-CM

## 2021-01-26 DIAGNOSIS — N201 Calculus of ureter: Secondary | ICD-10-CM

## 2021-01-26 DIAGNOSIS — A419 Sepsis, unspecified organism: Secondary | ICD-10-CM | POA: Diagnosis present

## 2021-01-26 DIAGNOSIS — K219 Gastro-esophageal reflux disease without esophagitis: Secondary | ICD-10-CM | POA: Diagnosis present

## 2021-01-26 DIAGNOSIS — Z8616 Personal history of COVID-19: Secondary | ICD-10-CM | POA: Diagnosis not present

## 2021-01-26 DIAGNOSIS — I2510 Atherosclerotic heart disease of native coronary artery without angina pectoris: Secondary | ICD-10-CM | POA: Diagnosis not present

## 2021-01-26 DIAGNOSIS — Z79899 Other long term (current) drug therapy: Secondary | ICD-10-CM | POA: Diagnosis not present

## 2021-01-26 DIAGNOSIS — N132 Hydronephrosis with renal and ureteral calculous obstruction: Secondary | ICD-10-CM | POA: Diagnosis not present

## 2021-01-26 DIAGNOSIS — N136 Pyonephrosis: Secondary | ICD-10-CM | POA: Diagnosis present

## 2021-01-26 DIAGNOSIS — I6529 Occlusion and stenosis of unspecified carotid artery: Secondary | ICD-10-CM | POA: Diagnosis present

## 2021-01-26 DIAGNOSIS — R109 Unspecified abdominal pain: Secondary | ICD-10-CM | POA: Diagnosis present

## 2021-01-26 HISTORY — DX: Sepsis, unspecified organism: A41.9

## 2021-01-26 HISTORY — DX: Sepsis, unspecified organism: N39.0

## 2021-01-26 HISTORY — DX: Acute kidney failure, unspecified: N17.9

## 2021-01-26 HISTORY — PX: CYSTOSCOPY WITH STENT PLACEMENT: SHX5790

## 2021-01-26 LAB — URINALYSIS, COMPLETE (UACMP) WITH MICROSCOPIC
Bilirubin Urine: NEGATIVE
Glucose, UA: NEGATIVE mg/dL
Ketones, ur: NEGATIVE mg/dL
Nitrite: NEGATIVE
Protein, ur: 30 mg/dL — AB
RBC / HPF: >50 RBC/hpf — ABNORMAL HIGH (ref 0–5)
Specific Gravity, Urine: 1.021 (ref 1.005–1.030)
WBC, UA: >50 WBC/hpf — ABNORMAL HIGH (ref 0–5)
pH: 6 (ref 5.0–8.0)

## 2021-01-26 LAB — CBC
HCT: 42.3 % (ref 39.0–52.0)
Hemoglobin: 14.2 g/dL (ref 13.0–17.0)
MCH: 28 pg (ref 26.0–34.0)
MCHC: 33.6 g/dL (ref 30.0–36.0)
MCV: 83.4 fL (ref 80.0–100.0)
Platelets: 153 10*3/uL (ref 150–400)
RBC: 5.07 MIL/uL (ref 4.22–5.81)
RDW: 14.1 % (ref 11.5–15.5)
WBC: 13.5 10*3/uL — ABNORMAL HIGH (ref 4.0–10.5)
nRBC: 0 % (ref 0.0–0.2)

## 2021-01-26 LAB — COMPREHENSIVE METABOLIC PANEL
ALT: 20 U/L (ref 0–44)
AST: 21 U/L (ref 15–41)
Albumin: 3.7 g/dL (ref 3.5–5.0)
Alkaline Phosphatase: 113 U/L (ref 38–126)
Anion gap: 11 (ref 5–15)
BUN: 17 mg/dL (ref 8–23)
CO2: 23 mmol/L (ref 22–32)
Calcium: 9 mg/dL (ref 8.9–10.3)
Chloride: 97 mmol/L — ABNORMAL LOW (ref 98–111)
Creatinine, Ser: 1.36 mg/dL — ABNORMAL HIGH (ref 0.61–1.24)
GFR, Estimated: 58 mL/min — ABNORMAL LOW (ref >60)
Glucose, Bld: 184 mg/dL — ABNORMAL HIGH (ref 70–99)
Potassium: 4.1 mmol/L (ref 3.5–5.1)
Sodium: 131 mmol/L — ABNORMAL LOW (ref 135–145)
Total Bilirubin: 1.9 mg/dL — ABNORMAL HIGH (ref 0.3–1.2)
Total Protein: 6.9 g/dL (ref 6.5–8.1)

## 2021-01-26 LAB — RESP PANEL BY RT-PCR (FLU A&B, COVID) ARPGX2
Influenza A by PCR: NEGATIVE
Influenza B by PCR: NEGATIVE
SARS Coronavirus 2 by RT PCR: NEGATIVE

## 2021-01-26 LAB — LIPASE, BLOOD: Lipase: 24 U/L (ref 11–51)

## 2021-01-26 LAB — LACTIC ACID, PLASMA: Lactic Acid, Venous: 1.4 mmol/L (ref 0.5–1.9)

## 2021-01-26 SURGERY — CYSTOSCOPY, WITH STENT INSERTION
Anesthesia: General | Site: Groin | Laterality: Left

## 2021-01-26 MED ORDER — ROCURONIUM BROMIDE 10 MG/ML (PF) SYRINGE
PREFILLED_SYRINGE | INTRAVENOUS | Status: AC
  Filled 2021-01-26: qty 10

## 2021-01-26 MED ORDER — SUCCINYLCHOLINE CHLORIDE 200 MG/10ML IV SOSY
PREFILLED_SYRINGE | INTRAVENOUS | Status: AC
  Filled 2021-01-26: qty 10

## 2021-01-26 MED ORDER — ACETAMINOPHEN 325 MG PO TABS
650.0000 mg | ORAL_TABLET | Freq: Once | ORAL | Status: DC
  Filled 2021-01-26: qty 2

## 2021-01-26 MED ORDER — BELLADONNA ALKALOIDS-OPIUM 16.2-60 MG RE SUPP
RECTAL | Status: AC
  Filled 2021-01-26: qty 1

## 2021-01-26 MED ORDER — MORPHINE SULFATE (PF) 4 MG/ML IV SOLN
4.0000 mg | Freq: Once | INTRAVENOUS | Status: AC
  Administered 2021-01-26: 4 mg via INTRAVENOUS
  Filled 2021-01-26: qty 1

## 2021-01-26 MED ORDER — SUGAMMADEX SODIUM 200 MG/2ML IV SOLN
INTRAVENOUS | Status: DC | PRN
Start: 2021-01-26 — End: 2021-01-26
  Administered 2021-01-26: 180 mg via INTRAVENOUS

## 2021-01-26 MED ORDER — MORPHINE SULFATE (PF) 2 MG/ML IV SOLN: 2.0000 mg | INTRAVENOUS | Status: DC | PRN

## 2021-01-26 MED ORDER — LACTATED RINGERS IV SOLN: INTRAVENOUS | Status: DC

## 2021-01-26 MED ORDER — AMLODIPINE BESYLATE 5 MG PO TABS
10.0000 mg | ORAL_TABLET | Freq: Every day | ORAL | Status: DC
  Administered 2021-01-27: 10 mg via ORAL
  Filled 2021-01-26: qty 2

## 2021-01-26 MED ORDER — SODIUM CHLORIDE 0.9 % IV SOLN
1000.0000 mL | Freq: Once | INTRAVENOUS | Status: AC
  Administered 2021-01-26: 1000 mL via INTRAVENOUS

## 2021-01-26 MED ORDER — CHLORHEXIDINE GLUCONATE 0.12 % MT SOLN: 15.0000 mL | Freq: Once | OROMUCOSAL | Status: AC

## 2021-01-26 MED ORDER — CYANOCOBALAMIN 500 MCG PO TABS
500.0000 ug | ORAL_TABLET | Freq: Every day | ORAL | Status: DC
  Administered 2021-01-27: 500 ug via ORAL
  Filled 2021-01-26: qty 1

## 2021-01-26 MED ORDER — ONDANSETRON HCL 4 MG/2ML IJ SOLN: 4.0000 mg | Freq: Four times a day (QID) | INTRAMUSCULAR | Status: DC | PRN

## 2021-01-26 MED ORDER — LIDOCAINE HCL (CARDIAC) PF 100 MG/5ML IV SOSY
PREFILLED_SYRINGE | INTRAVENOUS | Status: DC | PRN
  Administered 2021-01-26: 100 mg via INTRAVENOUS

## 2021-01-26 MED ORDER — CHLORHEXIDINE GLUCONATE 0.12 % MT SOLN
OROMUCOSAL | Status: AC
  Administered 2021-01-26: 15 mL via OROMUCOSAL
  Filled 2021-01-26: qty 15

## 2021-01-26 MED ORDER — BELLADONNA ALKALOIDS-OPIUM 16.2-60 MG RE SUPP
RECTAL | Status: DC | PRN
  Administered 2021-01-26: 1 via RECTAL

## 2021-01-26 MED ORDER — ACETAMINOPHEN 500 MG PO TABS
1000.0000 mg | ORAL_TABLET | Freq: Once | ORAL | Status: AC
  Administered 2021-01-26: 1000 mg via ORAL

## 2021-01-26 MED ORDER — ATORVASTATIN CALCIUM 80 MG PO TABS
80.0000 mg | ORAL_TABLET | Freq: Every day | ORAL | Status: DC
  Administered 2021-01-27: 80 mg via ORAL
  Filled 2021-01-26: qty 1

## 2021-01-26 MED ORDER — KETOROLAC TROMETHAMINE 30 MG/ML IJ SOLN
INTRAMUSCULAR | Status: DC | PRN
  Administered 2021-01-26: 15 mg via INTRAVENOUS

## 2021-01-26 MED ORDER — IOHEXOL 180 MG/ML  SOLN
INTRAMUSCULAR | Status: DC | PRN
  Administered 2021-01-26: 5 mL

## 2021-01-26 MED ORDER — FENTANYL CITRATE (PF) 100 MCG/2ML IJ SOLN
INTRAMUSCULAR | Status: AC
  Filled 2021-01-26: qty 2

## 2021-01-26 MED ORDER — OXYCODONE HCL 5 MG PO TABS
5.0000 mg | ORAL_TABLET | Freq: Once | ORAL | Status: DC | PRN
Start: 2021-01-26 — End: 2021-01-26

## 2021-01-26 MED ORDER — LIDOCAINE HCL (PF) 2 % IJ SOLN
INTRAMUSCULAR | Status: AC
  Filled 2021-01-26: qty 5

## 2021-01-26 MED ORDER — SODIUM CHLORIDE 0.9 % IR SOLN
Status: DC | PRN
  Administered 2021-01-26: 3000 mL

## 2021-01-26 MED ORDER — ACETAMINOPHEN 325 MG PO TABS
650.0000 mg | ORAL_TABLET | Freq: Four times a day (QID) | ORAL | Status: DC | PRN
  Administered 2021-01-27: 650 mg via ORAL
  Filled 2021-01-26: qty 2

## 2021-01-26 MED ORDER — ROCURONIUM BROMIDE 100 MG/10ML IV SOLN
INTRAVENOUS | Status: DC | PRN
  Administered 2021-01-26: 20 mg via INTRAVENOUS

## 2021-01-26 MED ORDER — SUCCINYLCHOLINE CHLORIDE 20 MG/ML IJ SOLN
INTRAMUSCULAR | Status: DC | PRN
  Administered 2021-01-26: 100 mg via INTRAVENOUS

## 2021-01-26 MED ORDER — LACTATED RINGERS IV BOLUS
500.0000 mL | Freq: Once | INTRAVENOUS | Status: AC
  Administered 2021-01-26: 500 mL via INTRAVENOUS

## 2021-01-26 MED ORDER — 0.9 % SODIUM CHLORIDE (POUR BTL) OPTIME
TOPICAL | Status: DC | PRN
  Administered 2021-01-26: 1000 mL

## 2021-01-26 MED ORDER — ONDANSETRON HCL 4 MG PO TABS: 4.0000 mg | ORAL_TABLET | Freq: Four times a day (QID) | ORAL | Status: DC | PRN

## 2021-01-26 MED ORDER — EZETIMIBE 10 MG PO TABS
10.0000 mg | ORAL_TABLET | Freq: Every day | ORAL | Status: DC
  Administered 2021-01-27: 10 mg via ORAL
  Filled 2021-01-26: qty 1

## 2021-01-26 MED ORDER — DEXAMETHASONE SODIUM PHOSPHATE 10 MG/ML IJ SOLN
INTRAMUSCULAR | Status: DC | PRN
  Administered 2021-01-26: 10 mg via INTRAVENOUS

## 2021-01-26 MED ORDER — FENTANYL CITRATE (PF) 100 MCG/2ML IJ SOLN: 25.0000 ug | INTRAMUSCULAR | Status: DC | PRN

## 2021-01-26 MED ORDER — OXYCODONE HCL 5 MG/5ML PO SOLN: 5.0000 mg | Freq: Once | ORAL | Status: DC | PRN

## 2021-01-26 MED ORDER — ONDANSETRON HCL 4 MG/2ML IJ SOLN: 4.0000 mg | Freq: Once | INTRAMUSCULAR | Status: AC

## 2021-01-26 MED ORDER — ONDANSETRON HCL 4 MG/2ML IJ SOLN
INTRAMUSCULAR | Status: DC | PRN
  Administered 2021-01-26: 4 mg via INTRAVENOUS

## 2021-01-26 MED ORDER — SODIUM CHLORIDE 0.9 % IV SOLN
1.0000 g | Freq: Once | INTRAVENOUS | Status: AC
  Administered 2021-01-26: 1 g via INTRAVENOUS
  Filled 2021-01-26: qty 10

## 2021-01-26 MED ORDER — GABAPENTIN 100 MG PO CAPS
100.0000 mg | ORAL_CAPSULE | Freq: Three times a day (TID) | ORAL | Status: DC
  Administered 2021-01-26 – 2021-01-27 (×2): 100 mg via ORAL
  Filled 2021-01-26 (×2): qty 1

## 2021-01-26 MED ORDER — ONDANSETRON HCL 4 MG/2ML IJ SOLN
INTRAMUSCULAR | Status: AC
  Administered 2021-01-26: 4 mg via INTRAVENOUS
  Filled 2021-01-26: qty 2

## 2021-01-26 MED ORDER — PROPOFOL 10 MG/ML IV BOLUS
INTRAVENOUS | Status: DC | PRN
  Administered 2021-01-26: 150 mg via INTRAVENOUS

## 2021-01-26 MED ORDER — PANTOPRAZOLE SODIUM 40 MG PO TBEC
40.0000 mg | DELAYED_RELEASE_TABLET | Freq: Every day | ORAL | Status: DC
  Administered 2021-01-27: 40 mg via ORAL
  Filled 2021-01-26: qty 1

## 2021-01-26 MED ORDER — TAMSULOSIN HCL 0.4 MG PO CAPS
0.4000 mg | ORAL_CAPSULE | Freq: Every day | ORAL | Status: DC
  Administered 2021-01-26: 0.4 mg via ORAL
  Filled 2021-01-26 (×2): qty 1

## 2021-01-26 MED ORDER — SODIUM CHLORIDE 0.9 % IV SOLN
1.0000 g | INTRAVENOUS | Status: DC
  Filled 2021-01-26: qty 10

## 2021-01-26 MED ORDER — ACETAMINOPHEN 650 MG RE SUPP: 650.0000 mg | Freq: Four times a day (QID) | RECTAL | Status: DC | PRN

## 2021-01-26 MED ORDER — PROPOFOL 10 MG/ML IV BOLUS
INTRAVENOUS | Status: AC
  Filled 2021-01-26: qty 20

## 2021-01-26 MED ORDER — FENTANYL CITRATE (PF) 100 MCG/2ML IJ SOLN
INTRAMUSCULAR | Status: DC | PRN
  Administered 2021-01-26: 25 ug via INTRAVENOUS
  Administered 2021-01-26: 50 ug via INTRAVENOUS
  Administered 2021-01-26: 25 ug via INTRAVENOUS

## 2021-01-26 SURGICAL SUPPLY — 23 items
BAG DRAIN CYSTO-URO LG1000N (MISCELLANEOUS) ×2 IMPLANT
BAG URINE DRAIN 2000ML AR STRL (UROLOGICAL SUPPLIES) ×2 IMPLANT
BRUSH SCRUB EZ 1% IODOPHOR (MISCELLANEOUS) ×2 IMPLANT
CATH FOL 2WAY LX 18X30 (CATHETERS) ×2 IMPLANT
CATH URETL 5X70 OPEN END (CATHETERS) ×2 IMPLANT
GAUZE 4X4 16PLY ~~LOC~~+RFID DBL (SPONGE) ×4 IMPLANT
GLOVE SURG UNDER POLY LF SZ7.5 (GLOVE) ×2 IMPLANT
GOWN STRL REUS W/ TWL LRG LVL3 (GOWN DISPOSABLE) ×1 IMPLANT
GOWN STRL REUS W/ TWL XL LVL3 (GOWN DISPOSABLE) ×1 IMPLANT
GOWN STRL REUS W/TWL LRG LVL3 (GOWN DISPOSABLE) ×2
GOWN STRL REUS W/TWL XL LVL3 (GOWN DISPOSABLE) ×2
GUIDEWIRE STR DUAL SENSOR (WIRE) ×2 IMPLANT
IV NS IRRIG 3000ML ARTHROMATIC (IV SOLUTION) ×2 IMPLANT
KIT TURNOVER CYSTO (KITS) ×2 IMPLANT
MANIFOLD NEPTUNE II (INSTRUMENTS) ×2 IMPLANT
PACK CYSTO AR (MISCELLANEOUS) ×2 IMPLANT
SET CYSTO W/LG BORE CLAMP LF (SET/KITS/TRAYS/PACK) ×2 IMPLANT
STENT URET 6FRX24 CONTOUR (STENTS) IMPLANT
STENT URET 6FRX26 CONTOUR (STENTS) ×2 IMPLANT
SURGILUBE 2OZ TUBE FLIPTOP (MISCELLANEOUS) ×2 IMPLANT
SYR TOOMEY IRRIG 70ML (MISCELLANEOUS)
SYRINGE TOOMEY IRRIG 70ML (MISCELLANEOUS) IMPLANT
WATER STERILE IRR 1000ML POUR (IV SOLUTION) ×2 IMPLANT

## 2021-01-26 NOTE — H&P (Signed)
History and Physical    DHRUVAN GULLION KVQ:[id number] DOB: Oct 29, 1955 DOA: 01/26/2021  PCP: Erasmo Downer, MD   Patient coming from: Home  I have personally briefly reviewed patient's old medical records in [facility]  Chief Complaint: Bilateral flank pain  HPI: Shawn Sherman is a 65 y.o. male with medical history significant for carotid artery stenosis, hypertension, history of nephrolithiasis, coronary artery disease who presents to the emergency room for evaluation of bilateral flank pain for about 3 days.  Pain is more in the left flank and lumbar area when compared to the right side. Patient has had fever and chills at home and upon arrival to the ER had a T-max of 102 Flank pain is associated with hematuria but he denies having any dysuria, nocturia or frequency. He denies having any nausea or vomiting, no headache, no shortness of breath, no dizziness, no lightheadedness, no focal deficit, no lower extremity swelling, no diaphoresis, no palpitations.   Labs show sodium 131, potassium 4.1, chloride 97, bicarb 23, glucose 184, BUN 17, creatinine 1.36 above a baseline of 0.7, calcium 9.0, alkaline phosphatase 113, albumin 3.7, lipase 24, AST 21, ALT 20, total protein 6.9, lactic acid 9.4, white count 13.5, hemoglobin 14.2, hematocrit 42.3, MCV 83.4, RDW 14.1, platelet count 153 Respiratory viral panel is negative Urine analysis shows pyuria Renal stone CT shows mild-to-moderate left hydronephrosis due to a 0.6 cm proximal left ureteral stone. 0.5 cm nonobstructing stone right kidney. Right much worse than left hip osteoarthritis. Degenerative change on the right has worsened since the prior exam.Mild prostatomegaly. Aortic Atherosclerosis . Twelve-lead EKG reviewed by me shows sinus tachycardia.   ED Course: Patient is a 65 year old male with a history of nephrolithiasis who presents to the ER for evaluation of bilateral flank pain and is found to have left-sided  hydronephrosis with pyuria. He has a T-max of 102F, tachycardic in the 110s, has leukocytosis and pyuria. He received a dose of IV Rocephin and is scheduled for ureteral stent placement by urology. He will be admitted to the hospital for further evaluation.  Review of Systems: As per HPI otherwise all other systems reviewed and negative.    Past Medical History:  Diagnosis Date   Anginal pain (HCC) 08/2018   a. 08/2018 - atyp c/p and neg trop; b. 08/2018 MV: EF 57%, inferolateral horizontal ST depression w/o ischemia or infarct.   Arthritis    Carotid arterial disease (HCC)    a. 01/2019 Carotid U/S: RICA 80-90, RCCA <50, RECA>50, LICA 80-99, LCCA>50, LECA>50; b. 01/2019 CT Head/Neck: RICA string sign in bulb - lumen <75mm. Sev prox RECA dzs. LICA 60 @ dist bulb. R vert 50-70, L vert 30-50.   Carotid bruit present 08/2018   left   Chewing tobacco nicotine dependence    Coronary artery disease    Essential hypertension 03/29/2017   GERD (gastroesophageal reflux disease)    History of 2019 novel coronavirus disease (COVID-19) 08/04/2020   History of kidney stones    Hyperlipidemia     Past Surgical History:  Procedure Laterality Date   ANTERIOR LAT LUMBAR FUSION Right 01/01/2013   Procedure: lumbar three-four extreme lateral interbody fusion;  Surgeon: Reinaldo Meeker, MD;  Location: [facility];  Service: Neurosurgery;  Laterality: Right;  Lumbar three-four extreme lateral interbody fusion     BACK SURGERY  08   COLONOSCOPY     ENDARTERECTOMY Right 02/18/2019   Procedure: ENDARTERECTOMY CAROTID;  Surgeon: Annice Needy, MD;  Location:  [facility];  Service: Vascular;  Laterality: Right;   ENDARTERECTOMY Left 06/11/2019   Procedure: ENDARTERECTOMY CAROTID;  Surgeon: Annice Needyew, Jason S, MD;  Location: [facility];  Service: Vascular;  Laterality: Left;   ESOPHAGOGASTRODUODENOSCOPY (EGD) WITH PROPOFOL N/A 07/05/2015   Procedure: ESOPHAGOGASTRODUODENOSCOPY (EGD) WITH PROPOFOL;  Surgeon: Midge Miniumarren Wohl, MD;   Location: [facility];  Service: Endoscopy;  Laterality: N/A;   EXTRACORPOREAL SHOCK WAVE LITHOTRIPSY Right 09/11/2018   Procedure: EXTRACORPOREAL SHOCK WAVE LITHOTRIPSY (ESWL);  Surgeon: Riki AltesStoioff, Scott C, MD;  Location: [facility];  Service: Urology;  Laterality: Right;   LUMBAR PERCUTANEOUS PEDICLE SCREW 1 LEVEL N/A 01/01/2013   Procedure: lumbar three-four percutaneous pedicle screws ;  Surgeon: Reinaldo Meekerandy O Kritzer, MD;  Location: [facility];  Service: Neurosurgery;  Laterality: N/A;  lumbar three-four percutaneous pedicle screws   SHOULDER ARTHROSCOPY WITH ROTATOR CUFF REPAIR Left 08/22/2020   Procedure: SHOULDER ARTHROSCOPY WITH ROTATOR CUFF REPAIR AND DEBRIDEMENT;  Surgeon: Lyndle HerrlichBowers, James R, MD;  Location: [facility];  Service: Orthopedics;  Laterality: Left;     reports that he has never smoked. His smokeless tobacco use includes chew. He reports that he does not drink alcohol and does not use drugs.  No Known Allergies  Family History  Problem Relation Age of Onset   Cancer Mother    Cancer Father    COPD Sister       Prior to Admission medications   Medication Sig Start Date End Date Taking? Authorizing Provider  amLODipine (NORVASC) 10 MG tablet Take 1 tablet (10 mg total) by mouth daily. 12/13/20   Alver SorrowWalker, Caitlin S, NP  aspirin 81 MG chewable tablet Chew 81 mg by mouth daily.    [provider]  atorvastatin (LIPITOR) 80 MG tablet TAKE 1 TABLET BY MOUTH ONCE DAILY 04/05/20   Erasmo DownerBacigalupo, Angela M, MD  clopidogrel (PLAVIX) 75 MG tablet TAKE 1 TABLET BY MOUTH ONCE DAILY WITH BREAKFAST 01/26/20   Annice Needyew, Jason S, MD  esomeprazole (NEXIUM) 40 MG capsule TAKE 1 CAPSULE BY MOUTH ONCE EVERY EVENING 10/24/20   Erasmo DownerBacigalupo, Angela M, MD  ezetimibe (ZETIA) 10 MG tablet TAKE 1 TABLET BY MOUTH ONCE DAILY 01/16/21   Alver SorrowWalker, Caitlin S, NP  gabapentin (NEURONTIN) 100 MG capsule TAKE 1 CAPSULE BY MOUTH 3 TIMES DAILY 01/23/21   Erasmo DownerBacigalupo, Angela M, MD  HYDROcodone-acetaminophen (NORCO/VICODIN) 5-325  MG tablet Take 1 tablet by mouth every 4 (four) hours as needed for moderate pain. 08/22/20 08/22/21  Lyndle HerrlichBowers, James R, MD  losartan (COZAAR) 25 MG tablet Take 0.5 tablets (12.5 mg total) by mouth daily. 04/22/20 10/19/20  Alver SorrowWalker, Caitlin S, NP  Multiple Vitamins-Minerals (AIRBORNE PO) Take 1 tablet by mouth daily.    [provider]  vitamin B-12 (CYANOCOBALAMIN) 500 MCG tablet Take 500 mcg by mouth daily.     [provider]    Physical Exam: Vitals:   01/26/21 1330 01/26/21 1405 01/26/21 1430 01/26/21 1551  BP: (!) 141/76  134/87 129/73  Pulse: 90  91 83  Resp:   14 18  Temp:  (!) 102 F (38.9 C)  (!) 100.7 F (38.2 C)  TempSrc:  Oral  Temporal  SpO2: 95%  95% 100%  Weight:    80.3 kg  Height:    5\' 10"  (1.778 m)     Vitals:   01/26/21 1330 01/26/21 1405 01/26/21 1430 01/26/21 1551  BP: (!) 141/76  134/87 129/73  Pulse: 90  91 83  Resp:   14 18  Temp:  (!) 102  F (38.9 C)  (!) 100.7 F (38.2 C)  TempSrc:  Oral  Temporal  SpO2: 95%  95% 100%  Weight:    80.3 kg  Height:    5\' 10"  (1.778 m)      Constitutional: Alert and oriented x 3 . Not in any apparent distress HEENT:      Head: Normocephalic and atraumatic.         Eyes: PERLA, EOMI, Conjunctivae are normal. Sclera is non-icteric.       Mouth/Throat: Mucous membranes are moist.       Neck: Supple with no signs of meningismus. Cardiovascular: Tachycardic. No murmurs, gallops, or rubs. 2+ symmetrical distal pulses are present . No JVD. No LE edema Respiratory: Respiratory effort normal .Lungs sounds clear bilaterally. No wheezes, crackles, or rhonchi.  Gastrointestinal: Soft, non tender, and non distended with positive bowel sounds.  Genitourinary: Left CVA tenderness. Musculoskeletal: Nontender with normal range of motion in all extremities. No cyanosis, or erythema of extremities. Neurologic:  Face is symmetric. Moving all extremities. No gross focal neurologic deficits . Skin: Skin is warm, dry.   No rash or ulcers Psychiatric: Mood and affect are normal    Labs on Admission: I have personally reviewed following labs and imaging studies  CBC: Recent Labs  Lab 01/26/21 1255  WBC 13.5*  HGB 14.2  HCT 42.3  MCV 83.4  PLT 153   Basic Metabolic Panel: Recent Labs  Lab 01/26/21 1255  NA 131*  K 4.1  CL 97*  CO2 23  GLUCOSE 184*  BUN 17  CREATININE 1.36*  CALCIUM 9.0   GFR: Estimated Creatinine Clearance: 56.7 mL/min (A) (by C-G formula based on SCr of 1.36 mg/dL (H)). Liver Function Tests: Recent Labs  Lab 01/26/21 1255  AST 21  ALT 20  ALKPHOS 113  BILITOT 1.9*  PROT 6.9  ALBUMIN 3.7   Recent Labs  Lab 01/26/21 1255  LIPASE 24   No results for input(s): AMMONIA in the last 168 hours. Coagulation Profile: No results for input(s): INR, PROTIME in the last 168 hours. Cardiac Enzymes: No results for input(s): CKTOTAL, CKMB, CKMBINDEX, TROPONINI in the last 168 hours. BNP (last 3 results) No results for input(s): PROBNP in the last 8760 hours. HbA1C: No results for input(s): HGBA1C in the last 72 hours. CBG: No results for input(s): GLUCAP in the last 168 hours. Lipid Profile: No results for input(s): CHOL, HDL, LDLCALC, TRIG, CHOLHDL, LDLDIRECT in the last 72 hours. Thyroid Function Tests: No results for input(s): TSH, T4TOTAL, FREET4, T3FREE, THYROIDAB in the last 72 hours. Anemia Panel: No results for input(s): VITAMINB12, FOLATE, FERRITIN, TIBC, IRON, RETICCTPCT in the last 72 hours. Urine analysis:    Component Value Date/Time   COLORURINE YELLOW (A) 01/26/2021 1402   APPEARANCEUR CLOUDY (A) 01/26/2021 1402   LABSPEC 1.021 01/26/2021 1402   PHURINE 6.0 01/26/2021 1402   GLUCOSEU NEGATIVE 01/26/2021 1402   HGBUR LARGE (A) 01/26/2021 1402   BILIRUBINUR NEGATIVE 01/26/2021 1402   BILIRUBINUR negative 02/13/2018 1640   KETONESUR NEGATIVE 01/26/2021 1402   PROTEINUR 30 (A) 01/26/2021 1402   UROBILINOGEN 0.2 02/13/2018 1640   NITRITE NEGATIVE  01/26/2021 1402   LEUKOCYTESUR LARGE (A) 01/26/2021 1402    Radiological Exams on Admission: DG OR UROLOGY CYSTO IMAGE ([facility] ONLY)  Result Date: 01/26/2021 There is no interpretation for this exam.  This order is for images obtained during a surgical procedure.  Please See "Surgeries" Tab for more information regarding the procedure.   CT  Renal Stone Study  Result Date: 01/26/2021 CLINICAL DATA:  Bilateral flank pain. History of urinary tract stones. EXAM: CT ABDOMEN AND PELVIS WITHOUT CONTRAST TECHNIQUE: Multidetector CT imaging of the abdomen and pelvis was performed following the standard protocol without IV contrast. COMPARISON:  CT abdomen and pelvis 02/13/2018. FINDINGS: Lower chest: Lung bases clear.  No pleural or pericardial effusion. Hepatobiliary: No focal liver abnormality is seen. No gallstones, gallbladder wall thickening, or biliary dilatation. Pancreas: Unremarkable. No pancreatic ductal dilatation or surrounding inflammatory changes. Spleen: Normal in size without focal abnormality. Adrenals/Urinary Tract: The adrenal glands are negative. The patient has mild to moderate left hydronephrosis with extensive stranding about the left kidney and proximal ureter due to a 0.6 cm proximal left ureteral stone. A 0.5 cm nonobstructing stone is present in the upper pole of the right kidney. No hydronephrosis on the right. No other urinary tract stones are present on the right or left. Stomach/Bowel: Stomach is within normal limits. Appendix appears normal. No evidence of bowel wall thickening, distention, or inflammatory changes. Vascular/Lymphatic: Aortic atherosclerosis. No enlarged abdominal or pelvic lymph nodes. Reproductive: Mild prostatomegaly. Other: None. Musculoskeletal: No acute or focal abnormality. The patient is status post L3-4 discectomy. Degenerative disc disease L4-5 and L5-S1 noted. 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 Right hip  osteoarthritis has markedly progressed since the prior CT. IMPRESSION: Mild-to-moderate left hydronephrosis due to a 0.6 cm proximal left ureteral stone. 0.5 cm nonobstructing stone right kidney. Right much worse than left hip osteoarthritis. Degenerative change on the right has worsened since the prior exam. Mild prostatomegaly. Aortic Atherosclerosis (ICD10-I70.0). Electronically Signed   By: Drusilla Kanner M.D.   On: 01/26/2021 14:49     Assessment/Plan Principal Problem:   Sepsis secondary to UTI [facility]) Active Problems:   Acid reflux   Carotid stenosis   Hydronephrosis with infection   AKI (acute kidney injury) (HCC)      Sepsis from urinary source As evidenced by fever with a T-max of 102F, tachycardia, leukocytosis and pyuria. Patient with complaints of bilateral flank pain, left greater than right and is found to have mild to moderate left hydronephrosis due to a 0.6 cm proximal left ureteral stone. Lactic acid is within normal Patient received 1 L of IV fluids in the emergency room Continue IV fluid resuscitation Place patient empirically on Rocephin 1 g IV daily Follow-up results of blood and urine culture    History of carotid stenosis Status post carotid endarterectomy Hold aspirin and Plavix for planned procedure Continue high intensity statins   Acute kidney injury At baseline patient has a serum creatinine of 0.7 but today on admission it is 1.36 Most likely secondary to obstructive nephropathy Hold Cozaar Continue IV fluid hydration Expect improvement in renal parameters following hydration as well as post ureteral stent placement Repeat renal parameters in a.m.     Hypertension Continue amlodipine Hold Cozaar due to acute kidney injury     GERD Continue Protonix    DVT prophylaxis: SCD Code Status: full code  Family Communication: Greater than 50% of time was spent discussing patient's condition and plan of care with him at the bedside.  All  questions and concerns have been addressed.  He verbalizes understanding and agrees to the plan. Disposition Plan: Back to previous home environment Consults called: Urology Status: At the time of admission, it appears that the appropriate admission status for this patient is inpatient. This is judged to be reasonable and necessary in  order to provide the required intensity of psychosocial the patient's safety given the presenting symptoms, physical exam findings, and initial radiographic and laboratory data in the context of their comorbid conditions.  Patient requires inpatient status due to high intensity of service, high risk for further deterioration and high frequency of surveillance required.     Lucile Shutters MD [facility]     01/26/2021, 4:28 PM

## 2021-01-26 NOTE — Consult Note (Signed)
 Urology Consult  I have been asked to see the patient by Dr. Kinner, for evaluation and management of an infected, obstructing proximal left ureteral stone.  Chief Complaint: Flank pain, nausea, vomiting  History of Present Illness: Shawn Sherman is a 65 y.o. year old male with PMH CAD and nephrolithiasis who presented to the ED today with reports of a 4-day history of bilateral flank pain and nausea.  Admission labs notable for UA with >50 WBCs/hpf, >50 RBCs/hpf, rare bacteria, and no nitrites; WBC count 13.5; creatinine 1.36 (baseline 0.8); and lactic acid 1.4. Urine and blood cultures pending, on antibiotics as below.  He has been febrile, T-max 39.4 C as well as tachycardic, but normotensive.  CT stone study revealed a 6 mm proximal left ureteral stone associated with mild to moderate left hydronephrosis with left perinephric stranding.  He also has a 5 mm nonobstructing right upper pole stone.  He is a patient of Dr. Stoioff's with his history of nephrolithiasis.  He underwent ESWL in March 2020 for management of a right upper pole stone.  On further questioning, it sounds like he is possibly undergone ureteroscopy with Dr. Wolff as well and tolerated it poorly.  He is n.p.o. since yesterday.  Anti-infectives (From admission, onward)    Start     Dose/Rate Route Frequency Ordered Stop   01/26/21 1445  cefTRIAXone (ROCEPHIN) 1 g in sodium chloride 0.9 % 100 mL IVPB        1 g 200 mL/hr over 30 Minutes Intravenous  Once 01/26/21 1432 01/26/21 1530       Past Medical History:  Diagnosis Date   Anginal pain (HCC) 08/2018   a. 08/2018 - atyp c/p and neg trop; b. 08/2018 MV: EF 57%, inferolateral horizontal ST depression w/o ischemia or infarct.   Arthritis    Carotid arterial disease (HCC)    a. 01/2019 Carotid U/S: RICA 80-90, RCCA <50, RECA>50, LICA 80-99, LCCA>50, LECA>50; b. 01/2019 CT Head/Neck: RICA string sign in bulb - lumen <1mm. Sev prox RECA dzs. LICA 60 @ dist bulb. R vert  50-70, L vert 30-50.   Carotid bruit present 08/2018   left   Chewing tobacco nicotine dependence    Coronary artery disease    Essential hypertension 03/29/2017   GERD (gastroesophageal reflux disease)    History of 2019 novel coronavirus disease (COVID-19) 08/04/2020   History of kidney stones    Hyperlipidemia     Past Surgical History:  Procedure Laterality Date   ANTERIOR LAT LUMBAR FUSION Right 01/01/2013   Procedure: lumbar three-four extreme lateral interbody fusion;  Surgeon: Randy O Kritzer, MD;  Location: [facility];  Service: Neurosurgery;  Laterality: Right;  Lumbar three-four extreme lateral interbody fusion     BACK SURGERY  08   COLONOSCOPY     ENDARTERECTOMY Right 02/18/2019   Procedure: ENDARTERECTOMY CAROTID;  Surgeon: Dew, Jason S, MD;  Location: [facility];  Service: Vascular;  Laterality: Right;   ENDARTERECTOMY Left 06/11/2019   Procedure: ENDARTERECTOMY CAROTID;  Surgeon: Dew, Jason S, MD;  Location: [facility];  Service: Vascular;  Laterality: Left;   ESOPHAGOGASTRODUODENOSCOPY (EGD) WITH PROPOFOL N/A 07/05/2015   Procedure: ESOPHAGOGASTRODUODENOSCOPY (EGD) WITH PROPOFOL;  Surgeon: Darren Wohl, MD;  Location: [facility];  Service: Endoscopy;  Laterality: N/A;   EXTRACORPOREAL SHOCK WAVE LITHOTRIPSY Right 09/11/2018   Procedure: EXTRACORPOREAL SHOCK WAVE LITHOTRIPSY (ESWL);  Surgeon: Stoioff, Scott C, MD;  Location: [facility];  Service: Urology;  Laterality: Right;   LUMBAR PERCUTANEOUS   PEDICLE SCREW 1 LEVEL N/A 01/01/2013   Procedure: lumbar three-four percutaneous pedicle screws ;  Surgeon: Reinaldo Meeker, MD;  Location: [facility];  Service: Neurosurgery;  Laterality: N/A;  lumbar three-four percutaneous pedicle screws   SHOULDER ARTHROSCOPY WITH ROTATOR CUFF REPAIR Left 08/22/2020   Procedure: SHOULDER ARTHROSCOPY WITH ROTATOR CUFF REPAIR AND DEBRIDEMENT;  Surgeon: Lyndle Herrlich, MD;  Location: [facility];  Service: Orthopedics;  Laterality: Left;    Home  Medications:  No outpatient medications have been marked as taking for the 01/26/21 encounter [facility] Encounter).    Allergies: No Known Allergies  Family History  Problem Relation Age of Onset   Cancer Mother    Cancer Father    COPD Sister     Social History:  reports that he has never smoked. His smokeless tobacco use includes chew. He reports that he does not drink alcohol and does not use drugs.  ROS: A complete review of systems was performed.  All systems are negative except for pertinent findings as noted.  Physical Exam:  Vital signs in last 24 hours: Temp:  [102 F (38.9 C)-102.9 F (39.4 C)] 102 F (38.9 C) (07/21 1405) Pulse Rate:  [90-126] 91 (07/21 1430) Resp:  [14-18] 14 (07/21 1430) BP: (133-141)/(76-87) 134/87 (07/21 1430) SpO2:  [95 %-96 %] 95 % (07/21 1430) Weight:  [80.3 kg] 80.3 kg (07/21 1236) Constitutional:  Alert and oriented, occasionally gripping his LLQ and discomfort HEENT: [place] AT, moist mucus membranes Cardiovascular: No clubbing, cyanosis, or edema Respiratory: Normal respiratory effort Skin: No rashes, bruises or suspicious lesions Neurologic: Grossly intact, no focal deficits, moving all 4 extremities Psychiatric: Normal mood and affect  Laboratory Data:  Recent Labs    01/26/21 1255  WBC 13.5*  HGB 14.2  HCT 42.3   Recent Labs    01/26/21 1255  NA 131*  K 4.1  CL 97*  CO2 23  GLUCOSE 184*  BUN 17  CREATININE 1.36*  CALCIUM 9.0   Urinalysis    Component Value Date/Time   COLORURINE YELLOW (A) 01/26/2021 1402   APPEARANCEUR CLOUDY (A) 01/26/2021 1402   LABSPEC 1.021 01/26/2021 1402   PHURINE 6.0 01/26/2021 1402   GLUCOSEU NEGATIVE 01/26/2021 1402   HGBUR LARGE (A) 01/26/2021 1402   BILIRUBINUR NEGATIVE 01/26/2021 1402   BILIRUBINUR negative 02/13/2018 1640   KETONESUR NEGATIVE 01/26/2021 1402   PROTEINUR 30 (A) 01/26/2021 1402   UROBILINOGEN 0.2 02/13/2018 1640   NITRITE NEGATIVE 01/26/2021 1402   LEUKOCYTESUR  LARGE (A) 01/26/2021 1402   Results for orders placed or performed during the hospital encounter of 01/26/21  Resp Panel by RT-PCR (Flu A&B, Covid) Nasopharyngeal Swab     Status: None   Collection Time: 01/26/21 12:55 PM   Specimen: Nasopharyngeal Swab; Nasopharyngeal(NP) swabs in vial transport medium  Result Value Ref Range Status   SARS Coronavirus 2 by RT PCR NEGATIVE NEGATIVE Final    Comment: (NOTE) SARS-CoV-2 target nucleic acids are NOT DETECTED.  The SARS-CoV-2 RNA is generally detectable in upper respiratory specimens during the acute phase of infection. The lowest concentration of SARS-CoV-2 viral copies this assay can detect is 138 copies/mL. A negative result does not preclude SARS-Cov-2 infection and should not be used as the sole basis for treatment or other patient management decisions. A negative result may occur with  improper specimen collection/handling, submission of specimen other than nasopharyngeal swab, presence of viral mutation(s) within the areas targeted by this assay, and inadequate number of viral copies(<138 copies/mL).  A negative result must be combined with clinical observations, patient history, and epidemiological information. The expected result is Negative.  Fact Sheet for Patients:  BloggerCourse.com  Fact Sheet for Healthcare Providers:  SeriousBroker.it  This test is no t yet approved or cleared by the [place] FDA and  has been authorized for detection and/or diagnosis of SARS-CoV-2 by FDA under an Emergency Use Authorization (EUA). This EUA will remain  in effect (meaning this test can be used) for the duration of the COVID-19 declaration under Section 564(b)(1) of the Act, 21 U.S.C.section 360bbb-3(b)(1), unless the authorization is terminated  or revoked sooner.       Influenza A by PCR NEGATIVE NEGATIVE Final   Influenza B by PCR NEGATIVE NEGATIVE Final    Comment:  (NOTE) The Xpert Xpress SARS-CoV-2/FLU/RSV plus assay is intended as an aid in the diagnosis of influenza from Nasopharyngeal swab specimens and should not be used as a sole basis for treatment. Nasal washings and aspirates are unacceptable for Xpert Xpress SARS-CoV-2/FLU/RSV testing.  Fact Sheet for Patients: BloggerCourse.com  Fact Sheet for Healthcare Providers: SeriousBroker.it  This test is not yet approved or cleared by the [place] FDA and has been authorized for detection and/or diagnosis of SARS-CoV-2 by FDA under an Emergency Use Authorization (EUA). This EUA will remain in effect (meaning this test can be used) for the duration of the COVID-19 declaration under Section 564(b)(1) of the Act, 21 U.S.C. section 360bbb-3(b)(1), unless the authorization is terminated or revoked.  Performed at [facility], [address], [place], [place] [address]     Radiologic Imaging: CT Renal Stone Study  Result Date: 01/26/2021 CLINICAL DATA:  Bilateral flank pain. History of urinary tract stones. EXAM: CT ABDOMEN AND PELVIS WITHOUT CONTRAST TECHNIQUE: Multidetector CT imaging of the abdomen and pelvis was performed following the standard protocol without IV contrast. COMPARISON:  CT abdomen and pelvis 02/13/2018. FINDINGS: Lower chest: Lung bases clear.  No pleural or pericardial effusion. Hepatobiliary: No focal liver abnormality is seen. No gallstones, gallbladder wall thickening, or biliary dilatation. Pancreas: Unremarkable. No pancreatic ductal dilatation or surrounding inflammatory changes. Spleen: Normal in size without focal abnormality. Adrenals/Urinary Tract: The adrenal glands are negative. The patient has mild to moderate left hydronephrosis with extensive stranding about the left kidney and proximal ureter due to a 0.6 cm proximal left ureteral stone. A 0.5 cm nonobstructing stone is present in the upper pole of the  right kidney. No hydronephrosis on the right. No other urinary tract stones are present on the right or left. Stomach/Bowel: Stomach is within normal limits. Appendix appears normal. No evidence of bowel wall thickening, distention, or inflammatory changes. Vascular/Lymphatic: Aortic atherosclerosis. No enlarged abdominal or pelvic lymph nodes. Reproductive: Mild prostatomegaly. Other: None. Musculoskeletal: No acute or focal abnormality. The patient is status post L3-4 discectomy. Degenerative disc disease L4-5 and L5-S1 noted. 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 Right hip osteoarthritis has markedly progressed since the prior CT. IMPRESSION: Mild-to-moderate left hydronephrosis due to a 0.6 cm proximal left ureteral stone. 0.5 cm nonobstructing stone right kidney. Right much worse than left hip osteoarthritis. Degenerative change on the right has worsened since the prior exam. Mild prostatomegaly. Aortic Atherosclerosis (ICD10-I70.0). Electronically Signed   By: Drusilla Kanner M.D.   On: 01/26/2021 14:49    Assessment & Plan:  65 year old male with PMH nephrolithiasis and CAD who presents to the ED with a 6 mm obstructing proximal left ureteral stone associated  with febrile UTI and AKI.  We discussed recommendations to undergo urgent left ureteral stent placement today for urinary decompression and source control of his urinary infection.  We discussed that the risks of this procedure include infection, bleeding, and damage to surrounding structures, but the risks of not proceeding include worsening infection, sepsis, and possible death.  We discussed common stent symptoms today including flank pain, bladder pain, dysuria, urgency, frequency, and gross hematuria.  Given that he may have poorly tolerated a stent in the past, I recommend that we be more aggressive with Flomax and oxybutynin in the postoperative period.  Patient expressed understanding of the  above and is willing to proceed.  Left side marked.  Recommendations: -Continue empiric antibiotics and follow urine culture -Urgent left ureteral stent placement with Dr. Richardo Hanks this afternoon -Keep n.p.o. in advance of surgery -Admit to medicine overnight for IV antibiotics  Thank you for involving me in this patient's care, I will continue to follow along.  Carman Ching, PA-C 01/26/2021 3:35 PM

## 2021-01-26 NOTE — Op Note (Signed)
Date of procedure: 01/26/21  Preoperative diagnosis:  Left proximal ureteral stone Sepsis from urinary source  Postoperative diagnosis:  Same  Procedure: Cystoscopy, left retrograde pyelogram with intraoperative interpretation, left ureteral stent placement  Surgeon: Legrand Rams, MD  Anesthesia: General  Complications: None  Intraoperative findings:  Moderate size prostate, ureteral orifices orthotopic bilaterally, mild bladder erythema consistent with UTI, no suspicious lesions Uncomplicated left ureteral stent placement with purulent drainage  EBL: Minimal  Specimens: Urine for culture from left kidney  Drains: Left 6 French by 26 cm ureteral stent, 18 French Foley  Indication: Shawn Sherman is a 65 y.o. patient with fever to 102, infected urine, and 1 cm left proximal ureteral stone with upstream hydronephrosis.  After reviewing the management options for treatment, they elected to proceed with the above surgical procedure(s). We have discussed the potential benefits and risks of the procedure, side effects of the proposed treatment, the likelihood of the patient achieving the goals of the procedure, and any potential problems that might occur during the procedure or recuperation. Informed consent has been obtained.  Description of procedure:  The patient was taken to the operating room and general anesthesia was induced. SCDs were placed for DVT prophylaxis. The patient was placed in the dorsal lithotomy position, prepped and draped in the usual sterile fashion, and preoperative antibiotics(ceftriaxone in ED) were administered. A preoperative time-out was performed.   A 21 French rigid cystoscope was used to intubate the urethra and a normal-appearing urethra was followed proximally into the bladder.  The prostate was moderate in size with a small median lobe.  The ureteral orifices were orthotopic bilaterally.  Thorough cystoscopy showed mild erythema consistent with  cystitis, but no suspicious lesions.  A retrograde pyelogram was performed from the left ureter and showed no contrast passing alongside the 1 cm left proximal ureteral stone.  With moderate difficulty I was ultimately able to navigate a sensor wire alongside the stone up into the kidney, and the access catheter was advanced over the wire into the kidney and the wire removed.  There was a brisk hydronephrotic drip of purulent urine, and this was sent for culture.  50 mL of purulent urine was aspirated from the left kidney.  The wire was replaced, and a 6 [place] by 26 cm ureteral stent was uneventfully placed with an excellent curl in the renal pelvis, as well as under direct vision the bladder.  Purulent urine drained through the side ports of the stent.  An 55 French Foley was placed and 10 mL placed in the balloon to maximize drainage in the postoperative period.  A belladonna suppository was placed.  Disposition: Stable to PACU  Plan: Admit to hospitalist overnight for antibiotics Okay to remove Foley in 1 to 2 days from urology perspective if clinically improving Will arrange follow-up in 2 to 3 weeks for definitive management with left ureteroscopy, laser lithotripsy, and stent placement once infection treated  Legrand Rams, MD

## 2021-01-26 NOTE — ED Triage Notes (Signed)
Patient c/o bilateral flank pain.

## 2021-01-26 NOTE — Anesthesia Preprocedure Evaluation (Signed)
Anesthesia Evaluation  Patient identified by MRN, date of birth, ID band Patient awake    Reviewed: Allergy & Precautions, NPO status , Patient's Chart, lab work & pertinent test results  History of Anesthesia Complications Negative for: history of anesthetic complications  Airway Mallampati: III  TM Distance: >3 FB Neck ROM: full    Dental  (+) Chipped   Pulmonary neg shortness of breath,    Pulmonary exam normal        Cardiovascular Exercise Tolerance: Good hypertension, (-) angina+ CAD  Normal cardiovascular exam     Neuro/Psych negative neurological ROS  negative psych ROS   GI/Hepatic Neg liver ROS, GERD  Medicated and Controlled,  Endo/Other  negative endocrine ROS  Renal/GU Renal disease     Musculoskeletal  (+) Arthritis ,   Abdominal   Peds  Hematology negative hematology ROS (+)   Anesthesia Other Findings Septic   Past Medical History: 08/2018: Anginal pain (HCC)     Comment:  a. 08/2018 - atyp c/p and neg trop; b. 08/2018 MV: EF 57%,              inferolateral horizontal ST depression w/o ischemia or               infarct. No date: Arthritis No date: Carotid arterial disease (HCC)     Comment:  a. 01/2019 Carotid U/S: RICA 80-90, RCCA <50, RECA>50,               LICA 80-99, LCCA>50, LECA>50; b. 01/2019 CT Head/Neck:               RICA string sign in bulb - lumen <78mm. Sev prox RECA dzs.              LICA 60 @ dist bulb. R vert 50-70, L vert 30-50. 08/2018: Carotid bruit present     Comment:  left No date: Chewing tobacco nicotine dependence No date: Coronary artery disease 03/29/2017: Essential hypertension No date: GERD (gastroesophageal reflux disease) 08/04/2020: History of 2019 novel coronavirus disease (COVID-19) No date: History of kidney stones No date: Hyperlipidemia  Past Surgical History: 01/01/2013: ANTERIOR LAT LUMBAR FUSION; Right     Comment:  Procedure: lumbar three-four  extreme lateral interbody               fusion;  Surgeon: Reinaldo Meeker, MD;  Location: [facility];  Service: Neurosurgery;  Laterality: Right;                Lumbar three-four extreme lateral interbody fusion   08: BACK SURGERY No date: COLONOSCOPY 02/18/2019: ENDARTERECTOMY; Right     Comment:  Procedure: ENDARTERECTOMY CAROTID;  Surgeon: Annice Needy, MD;  Location: [facility] OR[facility];  Service: Vascular;                Laterality: Right; 06/11/2019: ENDARTERECTOMY; Left     Comment:  Procedure: ENDARTERECTOMY CAROTID;  Surgeon: Annice Needy, MD;  Location: [facility];  Service: Vascular;                Laterality: Left; 07/05/2015: ESOPHAGOGASTRODUODENOSCOPY (EGD) WITH PROPOFOL; N/A     Comment:  Procedure: ESOPHAGOGASTRODUODENOSCOPY (EGD) WITH  PROPOFOL;  Surgeon: Midge Minium, MD;  Location[facility]               ENDOSCOPY;  Service: Endoscopy;  Laterality: N/A; 09/11/2018: EXTRACORPOREAL SHOCK WAVE LITHOTRIPSY; Right     Comment:  Procedure: EXTRACORPOREAL SHOCK WAVE LITHOTRIPSY (ESWL);              Surgeon: Riki Altes, MD;  Location: [facility] ORS;                Service: Urology;  Laterality: Right; 01/01/2013: LUMBAR PERCUTANEOUS PEDICLE SCREW 1 LEVEL; N/A     Comment:  Procedure: lumbar three-four percutaneous pedicle screws              ;  Surgeon: Reinaldo Meeker, MD;  Location: [facility];              Service: Neurosurgery;  Laterality: N/A;  lumbar               three-four percutaneous pedicle screws 08/22/2020: SHOULDER ARTHROSCOPY WITH ROTATOR CUFF REPAIR; Left     Comment:  Procedure: SHOULDER ARTHROSCOPY WITH ROTATOR CUFF REPAIR              AND DEBRIDEMENT;  Surgeon: Lyndle Herrlich, MD;                Location: [facility] ORS;  Service: Orthopedics;  Laterality:               Left;  BMI    Body Mass Index: 25.40 kg/m      Reproductive/Obstetrics negative OB ROS                              Anesthesia Physical Anesthesia Plan  ASA: 4 and emergent  Anesthesia Plan: General ETT   Post-op Pain Management:    Induction: Intravenous  PONV Risk Score and Plan: Ondansetron, Dexamethasone, Midazolam and Treatment may vary due to age or medical condition  Airway Management Planned: Oral ETT  Additional Equipment:   Intra-op Plan:   Post-operative Plan: Extubation in OR  Informed Consent: I have reviewed the patients History and Physical, chart, labs and discussed the procedure including the risks, benefits and alternatives for the proposed anesthesia with the patient or authorized representative who has indicated his/her understanding and acceptance.     Dental Advisory Given  Plan Discussed with: Anesthesiologist, CRNA and Surgeon  Anesthesia Plan Comments: (Patient consented for risks of anesthesia including but not limited to:  - adverse reactions to medications - damage to eyes, teeth, lips or other oral mucosa - nerve damage due to positioning  - sore throat or hoarseness - Damage to heart, brain, nerves, lungs, other parts of body or loss of life  Patient voiced understanding.)        Anesthesia Quick Evaluation

## 2021-01-26 NOTE — Anesthesia Postprocedure Evaluation (Signed)
Anesthesia Post Note  Patient: Shawn Sherman  Procedure(s) Performed: CYSTOSCOPY WITH STENT PLACEMENT (Left: Groin)  Patient location during evaluation: PACU Anesthesia Type: General Level of consciousness: awake and alert Pain management: pain level controlled Vital Signs Assessment: post-procedure vital signs reviewed and stable Respiratory status: spontaneous breathing, nonlabored ventilation, respiratory function stable and patient connected to nasal cannula oxygen Cardiovascular status: blood pressure returned to baseline and stable Postop Assessment: no apparent nausea or vomiting Anesthetic complications: no   No notable events documented.   Last Vitals:  Vitals:   01/26/21 1715 01/26/21 1730  BP: 114/67 127/60  Pulse: 83   Resp: (!) 21   Temp:  (!) 38.6 C  SpO2: 100%     Last Pain:  Vitals:   01/26/21 1730  TempSrc:   PainSc: 0-No pain                 Cleda Mccreedy Doniel Maiello

## 2021-01-26 NOTE — Plan of Care (Signed)
  Problem: Education: Goal: Knowledge of General Education information will improve Description Including pain rating scale, medication(s)/side effects and non-pharmacologic comfort measures Outcome: Progressing   Problem: Health Behavior/Discharge Planning: Goal: Ability to manage health-related needs will improve Outcome: Progressing   

## 2021-01-26 NOTE — Transfer of Care (Signed)
Immediate Anesthesia Transfer of Care Note  Patient: Shawn Sherman  Procedure(s) Performed: CYSTOSCOPY WITH STENT PLACEMENT (Left: Groin)  Patient Location: PACU  Anesthesia Type:General  Level of Consciousness: sedated  Airway & Oxygen Therapy: Patient Spontanous Breathing and Patient connected to face mask oxygen  Post-op Assessment: Report given to RN and Post -op Vital signs reviewed and stable  Post vital signs: Reviewed and stable  Last Vitals:  Vitals Value Taken Time  BP 94/79 01/26/21 1706  Temp    Pulse 87 01/26/21 1708  Resp 12 01/26/21 1708  SpO2 100 % 01/26/21 1708  Vitals shown include unvalidated device data.  Last Pain:  Vitals:   01/26/21 1551  TempSrc: Temporal  PainSc: 5          Complications: No notable events documented.

## 2021-01-26 NOTE — ED Provider Notes (Signed)
[facility] Emergency Department Provider Note  ____________________________________________   Event Date/Time   First MD Initiated Contact with Patient 01/26/21 1240     (approximate)  I have reviewed the triage vital signs and the nursing notes.   HISTORY  Chief Complaint Flank Pain   HPI Shawn Sherman is a 65 y.o. male presents to the ED with complaint of bilateral flank pain.  Patient states he has had issues with urinary tract infections and has a history of nephrolithiasis.  He states he felt bad last evening but did not realize until he got to the emergency department that he had a temperature.  He has had nausea.  He denies any upper respiratory symptoms or exposure to COVID.  Patient has had COVID vaccine.  At this time he denies any family members being sick.         Past Medical History:  Diagnosis Date   Anginal pain (HCC) 08/2018   a. 08/2018 - atyp c/p and neg trop; b. 08/2018 MV: EF 57%, inferolateral horizontal ST depression w/o ischemia or infarct.   Arthritis    Carotid arterial disease (HCC)    a. 01/2019 Carotid U/S: RICA 80-90, RCCA <50, RECA>50, LICA 80-99, LCCA>50, LECA>50; b. 01/2019 CT Head/Neck: RICA string sign in bulb - lumen <29mm. Sev prox RECA dzs. LICA 60 @ dist bulb. R vert 50-70, L vert 30-50.   Carotid bruit present 08/2018   left   Chewing tobacco nicotine dependence    Coronary artery disease    Essential hypertension 03/29/2017   GERD (gastroesophageal reflux disease)    History of 2019 novel coronavirus disease (COVID-19) 08/04/2020   History of kidney stones    Hyperlipidemia     Patient Active Problem List   Diagnosis Date Noted   Pre-diabetes 12/31/2019   Greater trochanteric bursitis of right hip 12/31/2019   Carotid stenosis, left 06/11/2019   Carotid stenosis, right 02/18/2019   Carotid stenosis 01/30/2019   Nephrolithiasis 12/29/2018   Essential hypertension 03/29/2017   History of hay fever  06/17/2015   Acid reflux 06/17/2015   Hyperlipidemia 06/17/2015    Past Surgical History:  Procedure Laterality Date   ANTERIOR LAT LUMBAR FUSION Right 01/01/2013   Procedure: lumbar three-four extreme lateral interbody fusion;  Surgeon: Reinaldo Meeker, MD;  Location: [facility];  Service: Neurosurgery;  Laterality: Right;  Lumbar three-four extreme lateral interbody fusion     BACK SURGERY  08   COLONOSCOPY     ENDARTERECTOMY Right 02/18/2019   Procedure: ENDARTERECTOMY CAROTID;  Surgeon: Annice Needy, MD;  Location: [facility];  Service: Vascular;  Laterality: Right;   ENDARTERECTOMY Left 06/11/2019   Procedure: ENDARTERECTOMY CAROTID;  Surgeon: Annice Needy, MD;  Location: [facility];  Service: Vascular;  Laterality: Left;   ESOPHAGOGASTRODUODENOSCOPY (EGD) WITH PROPOFOL N/A 07/05/2015   Procedure: ESOPHAGOGASTRODUODENOSCOPY (EGD) WITH PROPOFOL;  Surgeon: Midge Minium, MD;  Location: [facility];  Service: Endoscopy;  Laterality: N/A;   EXTRACORPOREAL SHOCK WAVE LITHOTRIPSY Right 09/11/2018   Procedure: EXTRACORPOREAL SHOCK WAVE LITHOTRIPSY (ESWL);  Surgeon: Riki Altes, MD;  Location: [facility];  Service: Urology;  Laterality: Right;   LUMBAR PERCUTANEOUS PEDICLE SCREW 1 LEVEL N/A 01/01/2013   Procedure: lumbar three-four percutaneous pedicle screws ;  Surgeon: Reinaldo Meeker, MD;  Location: [facility];  Service: Neurosurgery;  Laterality: N/A;  lumbar three-four percutaneous pedicle screws   SHOULDER ARTHROSCOPY WITH ROTATOR CUFF REPAIR Left 08/22/2020   Procedure: SHOULDER ARTHROSCOPY WITH  ROTATOR CUFF REPAIR AND DEBRIDEMENT;  Surgeon: Lyndle Herrlich, MD;  Location: [facility];  Service: Orthopedics;  Laterality: Left;    Prior to Admission medications   Medication Sig Start Date End Date Taking? Authorizing Provider  amLODipine (NORVASC) 10 MG tablet Take 1 tablet (10 mg total) by mouth daily. 12/13/20   Alver Sorrow, NP  aspirin 81 MG chewable tablet Chew 81 mg by mouth daily.     [provider]  atorvastatin (LIPITOR) 80 MG tablet TAKE 1 TABLET BY MOUTH ONCE DAILY 04/05/20   Erasmo Downer, MD  clopidogrel (PLAVIX) 75 MG tablet TAKE 1 TABLET BY MOUTH ONCE DAILY WITH BREAKFAST 01/26/20   Annice Needy, MD  esomeprazole (NEXIUM) 40 MG capsule TAKE 1 CAPSULE BY MOUTH ONCE EVERY EVENING 10/24/20   Erasmo Downer, MD  ezetimibe (ZETIA) 10 MG tablet TAKE 1 TABLET BY MOUTH ONCE DAILY 01/16/21   Alver Sorrow, NP  gabapentin (NEURONTIN) 100 MG capsule TAKE 1 CAPSULE BY MOUTH 3 TIMES DAILY 01/23/21   Erasmo Downer, MD  HYDROcodone-acetaminophen (NORCO/VICODIN) 5-325 MG tablet Take 1 tablet by mouth every 4 (four) hours as needed for moderate pain. 08/22/20 08/22/21  Lyndle Herrlich, MD  losartan (COZAAR) 25 MG tablet Take 0.5 tablets (12.5 mg total) by mouth daily. 04/22/20 10/19/20  Alver Sorrow, NP  Multiple Vitamins-Minerals (AIRBORNE PO) Take 1 tablet by mouth daily.    [provider]  vitamin B-12 (CYANOCOBALAMIN) 500 MCG tablet Take 500 mcg by mouth daily.     [provider]    Allergies Patient has no known allergies.  Family History  Problem Relation Age of Onset   Cancer Mother    Cancer Father    COPD Sister     Social History Social History   Tobacco Use   Smoking status: Never   Smokeless tobacco: Current    Types: Chew  Vaping Use   Vaping Use: Never used  Substance Use Topics   Alcohol use: No   Drug use: No    Review of Systems Constitutional: No fever/positive chills Eyes: No visual changes. ENT: No sore throat. Cardiovascular: Denies chest pain. Respiratory: Denies shortness of breath. Gastrointestinal: No abdominal pain.  Positive nausea, no vomiting.  No diarrhea.  No constipation. Genitourinary: Positive history of urinary tract infections.  Positive for kidney stones. Musculoskeletal: Negative for back pain. Skin: Negative for rash. Neurological: Negative for headaches, focal weakness  or numbness. ____________________________________________   PHYSICAL EXAM:  VITAL SIGNS: ED Triage Vitals  Enc Vitals Group     BP 01/26/21 1229 133/80     Pulse Rate 01/26/21 1229 (!) 126     Resp 01/26/21 1229 18     Temp 01/26/21 1229 (!) 102.9 F (39.4 C)     Temp Source 01/26/21 1229 Oral     SpO2 01/26/21 1229 96 %     Weight 01/26/21 1236 177 lb (80.3 kg)     Height 01/26/21 1236 5\' 10"  (1.778 m)     Head Circumference --      Peak Flow --      Pain Score 01/26/21 1236 10     Pain Loc --      Pain Edu? --      Excl. in GC? --     Constitutional: Alert and oriented. Well appearing and in no acute distress. Eyes: Conjunctivae are normal.  Head: Atraumatic. Neck: No stridor.   Cardiovascular: Normal rate, regular rhythm. Grossly normal  heart sounds.  Good peripheral circulation. Respiratory: Normal respiratory effort.  No retractions. Lungs CTAB. Gastrointestinal: Soft and nontender. No distention.  Bowel sounds are present x4 quadrants. Musculoskeletal: Unable to evaluate back as patient was having great deal of pain and having moderate aching due to fever of 102. Neurologic:  Normal speech and language. No gross focal neurologic deficits are appreciated.  Skin:  Skin is warm, dry and intact. No rash noted. Psychiatric: Mood and affect are normal. Speech and behavior are normal.  ____________________________________________   LABS (all labs ordered are listed, but only abnormal results are displayed)  Labs Reviewed  URINALYSIS, COMPLETE (UACMP) WITH MICROSCOPIC - Abnormal; Notable for the following components:      Result Value   Color, Urine YELLOW (*)    APPearance CLOUDY (*)    Hgb urine dipstick LARGE (*)    Protein, ur 30 (*)    Leukocytes,Ua LARGE (*)    RBC / HPF >50 (*)    WBC, UA >50 (*)    Bacteria, UA RARE (*)    All other components within normal limits  CBC - Abnormal; Notable for the following components:   WBC 13.5 (*)    All other  components within normal limits  COMPREHENSIVE METABOLIC PANEL - Abnormal; Notable for the following components:   Sodium 131 (*)    Chloride 97 (*)    Glucose, Bld 184 (*)    Creatinine, Ser 1.36 (*)    Total Bilirubin 1.9 (*)    GFR, Estimated 58 (*)    All other components within normal limits  RESP PANEL BY RT-PCR (FLU A&B, COVID) ARPGX2  CULTURE, BLOOD (ROUTINE X 2)  CULTURE, BLOOD (ROUTINE X 2)  URINE CULTURE  LIPASE, BLOOD  LACTIC ACID, PLASMA   ____________________________________________  EKG   ____________________________________________  RADIOLOGY I, Tommi Rumps, personally viewed and evaluated these images (plain radiographs) as part of my medical decision making, as well as reviewing the written report by the radiologist.    Official radiology report(s): DG OR UROLOGY CYSTO IMAGE ([facility] ONLY)  Result Date: 01/26/2021 There is no interpretation for this exam.  This order is for images obtained during a surgical procedure.  Please See "Surgeries" Tab for more information regarding the procedure.   CT Renal Stone Study  Result Date: 01/26/2021 CLINICAL DATA:  Bilateral flank pain. History of urinary tract stones. EXAM: CT ABDOMEN AND PELVIS WITHOUT CONTRAST TECHNIQUE: Multidetector CT imaging of the abdomen and pelvis was performed following the standard protocol without IV contrast. COMPARISON:  CT abdomen and pelvis 02/13/2018. FINDINGS: Lower chest: Lung bases clear.  No pleural or pericardial effusion. Hepatobiliary: No focal liver abnormality is seen. No gallstones, gallbladder wall thickening, or biliary dilatation. Pancreas: Unremarkable. No pancreatic ductal dilatation or surrounding inflammatory changes. Spleen: Normal in size without focal abnormality. Adrenals/Urinary Tract: The adrenal glands are negative. The patient has mild to moderate left hydronephrosis with extensive stranding about the left kidney and proximal ureter due to a 0.6 cm proximal left  ureteral stone. A 0.5 cm nonobstructing stone is present in the upper pole of the right kidney. No hydronephrosis on the right. No other urinary tract stones are present on the right or left. Stomach/Bowel: Stomach is within normal limits. Appendix appears normal. No evidence of bowel wall thickening, distention, or inflammatory changes. Vascular/Lymphatic: Aortic atherosclerosis. No enlarged abdominal or pelvic lymph nodes. Reproductive: Mild prostatomegaly. Other: None. Musculoskeletal: No acute or focal abnormality. The patient is status post L3-4 discectomy. Degenerative  disc disease L4-5 and L5-S1 noted. 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 Right hip osteoarthritis has markedly progressed since the prior CT. IMPRESSION: Mild-to-moderate left hydronephrosis due to a 0.6 cm proximal left ureteral stone. 0.5 cm nonobstructing stone right kidney. Right much worse than left hip osteoarthritis. Degenerative change on the right has worsened since the prior exam. Mild prostatomegaly. Aortic Atherosclerosis (ICD10-I70.0). Electronically Signed   By: Drusilla Kannerhomas  Dalessio M.D.   On: 01/26/2021 14:49    ____________________________________________   PROCEDURES  Procedure(s) performed (including Critical Care):  Procedures   ____________________________________________   INITIAL IMPRESSION / ASSESSMENT AND PLAN / ED COURSE  As part of my medical decision making, I reviewed the following data within the electronic MEDICAL RECORD NUMBER Notes from prior ED visits and [place] Controlled Substance Database  65 year old male presents to the ED with fever of 102, nausea and body aches.  Patient states that symptoms began last evening.  He denies any COVID-like symptoms and states that he has not been exposed.  He does have a history of urinary tract infections along with kidney stones.  He also has been seen by urology multiple times and normally sees Dr. Lonna CobbStoioff.  History and  findings were suspicious for urinary tract infection versus kidney stone versus obstruction secondary to kidney stone.  Dr. Cyril LoosenKinner was also seeing this patient with me and made further arrangements for this patient to be admitted. ____________________________________________   FINAL CLINICAL IMPRESSION(S) / ED DIAGNOSES  Final diagnoses:  Kidney stone  Urinary tract infection with hematuria, site unspecified     ED Discharge Orders     None        Note:  This document was prepared using Dragon voice recognition software and may include unintentional dictation errors.    Tommi RumpsSummers, Jeydi Klingel L, PA-C 01/26/21 1545    Jene EveryKinner, Robert, MD 01/26/21 [phone]

## 2021-01-26 NOTE — Anesthesia Procedure Notes (Signed)
Procedure Name: Intubation Date/Time: 01/26/2021 4:31 PM Performed by: Johnna Acosta, CRNA Pre-anesthesia Checklist: Patient identified, Emergency Drugs available, Suction available, Patient being monitored and Timeout performed Patient Re-evaluated:Patient Re-evaluated prior to induction Oxygen Delivery Method: Circle system utilized Preoxygenation: Pre-oxygenation with 100% oxygen Induction Type: IV induction, Rapid sequence and Cricoid Pressure applied Laryngoscope Size: Mac and 4 Grade View: Grade I Tube type: Oral Tube size: 7.5 mm Number of attempts: 1 Airway Equipment and Method: Stylet and Oral airway Placement Confirmation: ETT inserted through vocal cords under direct vision, positive ETCO2 and breath sounds checked- equal and bilateral Secured at: 23 cm Tube secured with: Tape Dental Injury: Teeth and Oropharynx as per pre-operative assessment

## 2021-01-26 NOTE — H&P (View-Only) (Signed)
Urology Consult  I have been asked to see the patient by Dr. Cyril LoosenKinner, for evaluation and management of an infected, obstructing proximal left ureteral stone.  Chief Complaint: Flank pain, nausea, vomiting  History of Present Illness: Shawn Sherman is a 65 y.o. year old male with PMH CAD and nephrolithiasis who presented to the ED today with reports of a 4-day history of bilateral flank pain and nausea.  Admission labs notable for UA with >50 WBCs/hpf, >50 RBCs/hpf, rare bacteria, and no nitrites; WBC count 13.5; creatinine 1.36 (baseline 0.8); and lactic acid 1.4. Urine and blood cultures pending, on antibiotics as below.  He has been febrile, T-max 39.4 C as well as tachycardic, but normotensive.  CT stone study revealed a 6 mm proximal left ureteral stone associated with mild to moderate left hydronephrosis with left perinephric stranding.  He also has a 5 mm nonobstructing right upper pole stone.  He is a patient of Dr. Heywood FootmanStoioff's with his history of nephrolithiasis.  He underwent ESWL in March 2020 for management of a right upper pole stone.  On further questioning, it sounds like he is possibly undergone ureteroscopy with Dr. Evelene CroonWolff as well and tolerated it poorly.  He is n.p.o. since yesterday.  Anti-infectives (From admission, onward)    Start     Dose/Rate Route Frequency Ordered Stop   01/26/21 1445  cefTRIAXone (ROCEPHIN) 1 g in sodium chloride 0.9 % 100 mL IVPB        1 g 200 mL/hr over 30 Minutes Intravenous  Once 01/26/21 1432 01/26/21 1530       Past Medical History:  Diagnosis Date   Anginal pain (HCC) 08/2018   a. 08/2018 - atyp c/p and neg trop; b. 08/2018 MV: EF 57%, inferolateral horizontal ST depression w/o ischemia or infarct.   Arthritis    Carotid arterial disease (HCC)    a. 01/2019 Carotid U/S: RICA 80-90, RCCA <50, RECA>50, LICA 80-99, LCCA>50, LECA>50; b. 01/2019 CT Head/Neck: RICA string sign in bulb - lumen <551mm. Sev prox RECA dzs. LICA 60 @ dist bulb. R vert  50-70, L vert 30-50.   Carotid bruit present 08/2018   left   Chewing tobacco nicotine dependence    Coronary artery disease    Essential hypertension 03/29/2017   GERD (gastroesophageal reflux disease)    History of 2019 novel coronavirus disease (COVID-19) 08/04/2020   History of kidney stones    Hyperlipidemia     Past Surgical History:  Procedure Laterality Date   ANTERIOR LAT LUMBAR FUSION Right 01/01/2013   Procedure: lumbar three-four extreme lateral interbody fusion;  Surgeon: Reinaldo Meekerandy O Kritzer, MD;  Location: [facility];  Service: Neurosurgery;  Laterality: Right;  Lumbar three-four extreme lateral interbody fusion     BACK SURGERY  08   COLONOSCOPY     ENDARTERECTOMY Right 02/18/2019   Procedure: ENDARTERECTOMY CAROTID;  Surgeon: Annice Needyew, Jason S, MD;  Location: [facility];  Service: Vascular;  Laterality: Right;   ENDARTERECTOMY Left 06/11/2019   Procedure: ENDARTERECTOMY CAROTID;  Surgeon: Annice Needyew, Jason S, MD;  Location: [facility];  Service: Vascular;  Laterality: Left;   ESOPHAGOGASTRODUODENOSCOPY (EGD) WITH PROPOFOL N/A 07/05/2015   Procedure: ESOPHAGOGASTRODUODENOSCOPY (EGD) WITH PROPOFOL;  Surgeon: Midge Miniumarren Wohl, MD;  Location: [facility];  Service: Endoscopy;  Laterality: N/A;   EXTRACORPOREAL SHOCK WAVE LITHOTRIPSY Right 09/11/2018   Procedure: EXTRACORPOREAL SHOCK WAVE LITHOTRIPSY (ESWL);  Surgeon: Riki AltesStoioff, Scott C, MD;  Location: [facility];  Service: Urology;  Laterality: Right;   LUMBAR PERCUTANEOUS  PEDICLE SCREW 1 LEVEL N/A 01/01/2013   Procedure: lumbar three-four percutaneous pedicle screws ;  Surgeon: Reinaldo Meeker, MD;  Location: [facility];  Service: Neurosurgery;  Laterality: N/A;  lumbar three-four percutaneous pedicle screws   SHOULDER ARTHROSCOPY WITH ROTATOR CUFF REPAIR Left 08/22/2020   Procedure: SHOULDER ARTHROSCOPY WITH ROTATOR CUFF REPAIR AND DEBRIDEMENT;  Surgeon: Lyndle Herrlich, MD;  Location: [facility];  Service: Orthopedics;  Laterality: Left;    Home  Medications:  No outpatient medications have been marked as taking for the 01/26/21 encounter [facility] Encounter).    Allergies: No Known Allergies  Family History  Problem Relation Age of Onset   Cancer Mother    Cancer Father    COPD Sister     Social History:  reports that he has never smoked. His smokeless tobacco use includes chew. He reports that he does not drink alcohol and does not use drugs.  ROS: A complete review of systems was performed.  All systems are negative except for pertinent findings as noted.  Physical Exam:  Vital signs in last 24 hours: Temp:  [102 F (38.9 C)-102.9 F (39.4 C)] 102 F (38.9 C) (07/21 1405) Pulse Rate:  [90-126] 91 (07/21 1430) Resp:  [14-18] 14 (07/21 1430) BP: (133-141)/(76-87) 134/87 (07/21 1430) SpO2:  [95 %-96 %] 95 % (07/21 1430) Weight:  [80.3 kg] 80.3 kg (07/21 1236) Constitutional:  Alert and oriented, occasionally gripping his LLQ and discomfort HEENT: [place] AT, moist mucus membranes Cardiovascular: No clubbing, cyanosis, or edema Respiratory: Normal respiratory effort Skin: No rashes, bruises or suspicious lesions Neurologic: Grossly intact, no focal deficits, moving all 4 extremities Psychiatric: Normal mood and affect  Laboratory Data:  Recent Labs    01/26/21 1255  WBC 13.5*  HGB 14.2  HCT 42.3   Recent Labs    01/26/21 1255  NA 131*  K 4.1  CL 97*  CO2 23  GLUCOSE 184*  BUN 17  CREATININE 1.36*  CALCIUM 9.0   Urinalysis    Component Value Date/Time   COLORURINE YELLOW (A) 01/26/2021 1402   APPEARANCEUR CLOUDY (A) 01/26/2021 1402   LABSPEC 1.021 01/26/2021 1402   PHURINE 6.0 01/26/2021 1402   GLUCOSEU NEGATIVE 01/26/2021 1402   HGBUR LARGE (A) 01/26/2021 1402   BILIRUBINUR NEGATIVE 01/26/2021 1402   BILIRUBINUR negative 02/13/2018 1640   KETONESUR NEGATIVE 01/26/2021 1402   PROTEINUR 30 (A) 01/26/2021 1402   UROBILINOGEN 0.2 02/13/2018 1640   NITRITE NEGATIVE 01/26/2021 1402   LEUKOCYTESUR  LARGE (A) 01/26/2021 1402   Results for orders placed or performed during the hospital encounter of 01/26/21  Resp Panel by RT-PCR (Flu A&B, Covid) Nasopharyngeal Swab     Status: None   Collection Time: 01/26/21 12:55 PM   Specimen: Nasopharyngeal Swab; Nasopharyngeal(NP) swabs in vial transport medium  Result Value Ref Range Status   SARS Coronavirus 2 by RT PCR NEGATIVE NEGATIVE Final    Comment: (NOTE) SARS-CoV-2 target nucleic acids are NOT DETECTED.  The SARS-CoV-2 RNA is generally detectable in upper respiratory specimens during the acute phase of infection. The lowest concentration of SARS-CoV-2 viral copies this assay can detect is 138 copies/mL. A negative result does not preclude SARS-Cov-2 infection and should not be used as the sole basis for treatment or other patient management decisions. A negative result may occur with  improper specimen collection/handling, submission of specimen other than nasopharyngeal swab, presence of viral mutation(s) within the areas targeted by this assay, and inadequate number of viral copies(<138 copies/mL).  A negative result must be combined with clinical observations, patient history, and epidemiological information. The expected result is Negative.  Fact Sheet for Patients:  BloggerCourse.com  Fact Sheet for Healthcare Providers:  SeriousBroker.it  This test is no t yet approved or cleared by the [place] FDA and  has been authorized for detection and/or diagnosis of SARS-CoV-2 by FDA under an Emergency Use Authorization (EUA). This EUA will remain  in effect (meaning this test can be used) for the duration of the COVID-19 declaration under Section 564(b)(1) of the Act, 21 U.S.C.section 360bbb-3(b)(1), unless the authorization is terminated  or revoked sooner.       Influenza A by PCR NEGATIVE NEGATIVE Final   Influenza B by PCR NEGATIVE NEGATIVE Final    Comment:  (NOTE) The Xpert Xpress SARS-CoV-2/FLU/RSV plus assay is intended as an aid in the diagnosis of influenza from Nasopharyngeal swab specimens and should not be used as a sole basis for treatment. Nasal washings and aspirates are unacceptable for Xpert Xpress SARS-CoV-2/FLU/RSV testing.  Fact Sheet for Patients: BloggerCourse.com  Fact Sheet for Healthcare Providers: SeriousBroker.it  This test is not yet approved or cleared by the [place] FDA and has been authorized for detection and/or diagnosis of SARS-CoV-2 by FDA under an Emergency Use Authorization (EUA). This EUA will remain in effect (meaning this test can be used) for the duration of the COVID-19 declaration under Section 564(b)(1) of the Act, 21 U.S.C. section 360bbb-3(b)(1), unless the authorization is terminated or revoked.  Performed at [facility], [address], [place], [place] [address]     Radiologic Imaging: CT Renal Stone Study  Result Date: 01/26/2021 CLINICAL DATA:  Bilateral flank pain. History of urinary tract stones. EXAM: CT ABDOMEN AND PELVIS WITHOUT CONTRAST TECHNIQUE: Multidetector CT imaging of the abdomen and pelvis was performed following the standard protocol without IV contrast. COMPARISON:  CT abdomen and pelvis 02/13/2018. FINDINGS: Lower chest: Lung bases clear.  No pleural or pericardial effusion. Hepatobiliary: No focal liver abnormality is seen. No gallstones, gallbladder wall thickening, or biliary dilatation. Pancreas: Unremarkable. No pancreatic ductal dilatation or surrounding inflammatory changes. Spleen: Normal in size without focal abnormality. Adrenals/Urinary Tract: The adrenal glands are negative. The patient has mild to moderate left hydronephrosis with extensive stranding about the left kidney and proximal ureter due to a 0.6 cm proximal left ureteral stone. A 0.5 cm nonobstructing stone is present in the upper pole of the  right kidney. No hydronephrosis on the right. No other urinary tract stones are present on the right or left. Stomach/Bowel: Stomach is within normal limits. Appendix appears normal. No evidence of bowel wall thickening, distention, or inflammatory changes. Vascular/Lymphatic: Aortic atherosclerosis. No enlarged abdominal or pelvic lymph nodes. Reproductive: Mild prostatomegaly. Other: None. Musculoskeletal: No acute or focal abnormality. The patient is status post L3-4 discectomy. Degenerative disc disease L4-5 and L5-S1 noted. 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 Right hip osteoarthritis has markedly progressed since the prior CT. IMPRESSION: Mild-to-moderate left hydronephrosis due to a 0.6 cm proximal left ureteral stone. 0.5 cm nonobstructing stone right kidney. Right much worse than left hip osteoarthritis. Degenerative change on the right has worsened since the prior exam. Mild prostatomegaly. Aortic Atherosclerosis (ICD10-I70.0). Electronically Signed   By: Drusilla Kanner M.D.   On: 01/26/2021 14:49    Assessment & Plan:  65 year old male with PMH nephrolithiasis and CAD who presents to the ED with a 6 mm obstructing proximal left ureteral stone associated  with febrile UTI and AKI.  We discussed recommendations to undergo urgent left ureteral stent placement today for urinary decompression and source control of his urinary infection.  We discussed that the risks of this procedure include infection, bleeding, and damage to surrounding structures, but the risks of not proceeding include worsening infection, sepsis, and possible death.  We discussed common stent symptoms today including flank pain, bladder pain, dysuria, urgency, frequency, and gross hematuria.  Given that he may have poorly tolerated a stent in the past, I recommend that we be more aggressive with Flomax and oxybutynin in the postoperative period.  Patient expressed understanding of the  above and is willing to proceed.  Left side marked.  Recommendations: -Continue empiric antibiotics and follow urine culture -Urgent left ureteral stent placement with Dr. Richardo Hanks this afternoon -Keep n.p.o. in advance of surgery -Admit to medicine overnight for IV antibiotics  Thank you for involving me in this patient's care, I will continue to follow along.  Carman Ching, PA-C 01/26/2021 3:35 PM

## 2021-01-26 NOTE — ED Notes (Signed)
Consent obtained. Patient placed in hospital gown, clothing (blue shirt, black shorts, brown sandals and cell phone placed in patient belongings bag.)

## 2021-01-27 ENCOUNTER — Encounter: Payer: Self-pay | Admitting: Urology

## 2021-01-27 DIAGNOSIS — R739 Hyperglycemia, unspecified: Secondary | ICD-10-CM

## 2021-01-27 DIAGNOSIS — N200 Calculus of kidney: Secondary | ICD-10-CM | POA: Diagnosis not present

## 2021-01-27 DIAGNOSIS — N201 Calculus of ureter: Secondary | ICD-10-CM | POA: Diagnosis not present

## 2021-01-27 DIAGNOSIS — N138 Other obstructive and reflux uropathy: Secondary | ICD-10-CM

## 2021-01-27 DIAGNOSIS — A419 Sepsis, unspecified organism: Secondary | ICD-10-CM | POA: Diagnosis not present

## 2021-01-27 DIAGNOSIS — I6523 Occlusion and stenosis of bilateral carotid arteries: Secondary | ICD-10-CM | POA: Diagnosis not present

## 2021-01-27 DIAGNOSIS — N390 Urinary tract infection, site not specified: Secondary | ICD-10-CM | POA: Diagnosis not present

## 2021-01-27 DIAGNOSIS — N179 Acute kidney failure, unspecified: Secondary | ICD-10-CM

## 2021-01-27 LAB — PROCALCITONIN: Procalcitonin: 0.14 ng/mL

## 2021-01-27 LAB — BASIC METABOLIC PANEL
Anion gap: 8 (ref 5–15)
BUN: 20 mg/dL (ref 8–23)
CO2: 24 mmol/L (ref 22–32)
Calcium: 8.8 mg/dL — ABNORMAL LOW (ref 8.9–10.3)
Chloride: 102 mmol/L (ref 98–111)
Creatinine, Ser: 0.97 mg/dL (ref 0.61–1.24)
GFR, Estimated: >60 mL/min (ref >60)
Glucose, Bld: 288 mg/dL — ABNORMAL HIGH (ref 70–99)
Potassium: 4.5 mmol/L (ref 3.5–5.1)
Sodium: 134 mmol/L — ABNORMAL LOW (ref 135–145)

## 2021-01-27 LAB — PROTIME-INR
INR: 1.2 (ref 0.8–1.2)
Prothrombin Time: 14.7 seconds (ref 11.4–15.2)

## 2021-01-27 LAB — CBC
HCT: 37.7 % — ABNORMAL LOW (ref 39.0–52.0)
Hemoglobin: 12.7 g/dL — ABNORMAL LOW (ref 13.0–17.0)
MCH: 28 pg (ref 26.0–34.0)
MCHC: 33.7 g/dL (ref 30.0–36.0)
MCV: 83.2 fL (ref 80.0–100.0)
Platelets: 149 10*3/uL — ABNORMAL LOW (ref 150–400)
RBC: 4.53 MIL/uL (ref 4.22–5.81)
RDW: 14.4 % (ref 11.5–15.5)
WBC: 10.1 10*3/uL (ref 4.0–10.5)
nRBC: 0 % (ref 0.0–0.2)

## 2021-01-27 LAB — CORTISOL-AM, BLOOD: Cortisol - AM: 3.5 ug/dL — ABNORMAL LOW (ref 6.7–22.6)

## 2021-01-27 LAB — HIV ANTIBODY (ROUTINE TESTING W REFLEX): HIV Screen 4th Generation wRfx: NONREACTIVE

## 2021-01-27 MED ORDER — TAMSULOSIN HCL 0.4 MG PO CAPS: 0.4000 mg | ORAL_CAPSULE | Freq: Every day | ORAL | 0 refills | Status: DC

## 2021-01-27 MED ORDER — SODIUM CHLORIDE 0.9 % IV SOLN
1.0000 g | INTRAVENOUS | Status: DC
  Administered 2021-01-27: 1 g via INTRAVENOUS
  Filled 2021-01-27: qty 10

## 2021-01-27 MED ORDER — CHLORHEXIDINE GLUCONATE CLOTH 2 % EX PADS
6.0000 | MEDICATED_PAD | Freq: Every day | CUTANEOUS | Status: DC
  Administered 2021-01-27: 6 via TOPICAL

## 2021-01-27 MED ORDER — CEPHALEXIN 500 MG PO CAPS: 500.0000 mg | ORAL_CAPSULE | Freq: Three times a day (TID) | ORAL | 0 refills | Status: DC

## 2021-01-27 NOTE — Discharge Summary (Signed)
[facility] [place] [facility]   PATIENT NAME: Shawn SorrowCalvin Sherman    MR#:  [id number]  DATE OF BIRTH:  February 16, 1956  DATE OF ADMISSION:  01/26/2021 ADMITTING PHYSICIAN: Alford Highlandichard Caldwell Kronenberger, MD  DATE OF DISCHARGE: 01/27/2021 12:58 PM  PRIMARY CARE PHYSICIAN: Erasmo DownerBacigalupo, Angela M, MD    ADMISSION DIAGNOSIS:  Kidney stone [N20.0] Sepsis secondary to UTI (HCC) [A41.9, N39.0] Urinary tract infection with hematuria, site unspecified [N39.0, R31.9]  DISCHARGE DIAGNOSIS:  Principal Problem:   Sepsis secondary to UTI [facility](HCC) Active Problems:   Acid reflux   Carotid stenosis   Hydronephrosis with infection   AKI (acute kidney injury) (HCC)   Kidney stone   SECONDARY DIAGNOSIS:   Past Medical History:  Diagnosis Date   Anginal pain (HCC) 08/2018   a. 08/2018 - atyp c/p and neg trop; b. 08/2018 MV: EF 57%, inferolateral horizontal ST depression w/o ischemia or infarct.   Arthritis    Carotid arterial disease (HCC)    a. 01/2019 Carotid U/S: RICA 80-90, RCCA <50, RECA>50, LICA 80-99, LCCA>50, LECA>50; b. 01/2019 CT Head/Neck: RICA string sign in bulb - lumen <531mm. Sev prox RECA dzs. LICA 60 @ dist bulb. R vert 50-70, L vert 30-50.   Carotid bruit present 08/2018   left   Chewing tobacco nicotine dependence    Coronary artery disease    Essential hypertension 03/29/2017   GERD (gastroesophageal reflux disease)    History of 2019 novel coronavirus disease (COVID-19) 08/04/2020   History of kidney stones    Hyperlipidemia     HOSPITAL COURSE:   1.  Clinical sepsis present on admission secondary to obstructing kidney stone.  Patient had urgent stent procedure by Dr. Richardo HanksSninsky on 01/26/2021.  Patient felt much better the following day and was interested in being discharged.  Patient was given Rocephin on admission and a dose of Rocephin prior to disposition.  Patient will be on Keflex 500 mg 3 times a day for another 12 days.  Follow-up with urology as outpatient to have stent exchange  and procedure for the stone.  Continue Flomax.  White blood cell count normalized.  Increase water intake. 2.  Acute kidney injury improved after IV fluids and urgent stent procedure.  Creatinine went from 1.36 down to 0.97. 3.  History of carotid stenosis.  Can go back on aspirin and Plavix as outpatient.  Likely will have to hold prior to urology procedure coming up in a few weeks. 4. Essential hypertension continue amlodipine.  Hold Cozaar for now. 5.  Elevated blood sugars likely has underlying diabetes.  Needs close follow-up as outpatient.  DISCHARGE CONDITIONS:   Satisfactory  CONSULTS OBTAINED:  Treatment Team:  Sondra ComeSninsky, Brian C, MD  DRUG ALLERGIES:  No Known Allergies  DISCHARGE MEDICATIONS:   Allergies as of 01/27/2021   No Known Allergies      Medication List     STOP taking these medications    HYDROcodone-acetaminophen 5-325 MG tablet Commonly known as: NORCO/VICODIN   losartan 25 MG tablet Commonly known as: COZAAR       TAKE these medications    AIRBORNE PO Take 1 tablet by mouth daily.   amLODipine 10 MG tablet Commonly known as: NORVASC Take 1 tablet (10 mg total) by mouth daily.   aspirin 81 MG chewable tablet Chew 81 mg by mouth daily.   atorvastatin 80 MG tablet Commonly known as: LIPITOR TAKE 1 TABLET BY MOUTH ONCE DAILY   cephALEXin 500 MG capsule Commonly known as: KEFLEX  Take 1 capsule (500 mg total) by mouth 3 (three) times daily for 12 days. Start taking on: January 28, 2021   clopidogrel 75 MG tablet Commonly known as: PLAVIX TAKE 1 TABLET BY MOUTH ONCE DAILY WITH BREAKFAST   esomeprazole 40 MG capsule Commonly known as: NEXIUM TAKE 1 CAPSULE BY MOUTH ONCE EVERY EVENING   ezetimibe 10 MG tablet Commonly known as: ZETIA TAKE 1 TABLET BY MOUTH ONCE DAILY   gabapentin 100 MG capsule Commonly known as: NEURONTIN TAKE 1 CAPSULE BY MOUTH 3 TIMES DAILY   tamsulosin 0.4 MG Caps capsule Commonly known as: FLOMAX Take 1 capsule  (0.4 mg total) by mouth daily after supper.   traMADol 50 MG tablet Commonly known as: ULTRAM Take 50 mg by mouth every 6 (six) hours as needed for moderate pain.   vitamin B-12 500 MCG tablet Commonly known as: CYANOCOBALAMIN Take 500 mcg by mouth daily.         DISCHARGE INSTRUCTIONS:   Follow-up PMD 5 days Follow-up urology 2 to 3 weeks  If you experience worsening of your admission symptoms, develop shortness of breath, life threatening emergency, suicidal or homicidal thoughts you must seek medical attention immediately by calling 911 or calling your MD immediately  if symptoms less severe.  You Must read complete instructions/literature along with all the possible adverse reactions/side effects for all the Medicines you take and that have been prescribed to you. Take any new Medicines after you have completely understood and accept all the possible adverse reactions/side effects.   Please note  You were cared for by a hospitalist during your hospital stay. If you have any questions about your discharge medications or the care you received while you were in the hospital after you are discharged, you can call the unit and asked to speak with the hospitalist on call if the hospitalist that took care of you is not available. Once you are discharged, your primary care physician will handle any further medical issues. Please note that NO REFILLS for any discharge medications will be authorized once you are discharged, as it is imperative that you return to your primary care physician (or establish a relationship with a primary care physician if you do not have one) for your aftercare needs so that they can reassess your need for medications and monitor your lab values.    Today   CHIEF COMPLAINT:   Chief Complaint  Patient presents with   Flank Pain    HISTORY OF PRESENT ILLNESS:  [name]  is a 65 y.o. male came in with flank pain and found to have obstructing kidney  stone   VITAL SIGNS:  Blood pressure 110/68, pulse 74, temperature 98 F (36.7 C), resp. rate 17, height 5\' 10"  (1.778 m), weight 80.3 kg, SpO2 98 %.  I/O:   Intake/Output Summary (Last 24 hours) at 01/27/2021 1659 Last data filed at 01/27/2021 1008 Gross per 24 hour  Intake 1340 ml  Output 3650 ml  Net -2310 ml    PHYSICAL EXAMINATION:  GENERAL:  65 y.o.-year-old patient lying in the bed with no acute distress.  EYES: Pupils equal, round, reactive to light and accommodation. No scleral icterus. Extraocular muscles intact.  HEENT: Head atraumatic, normocephalic. Oropharynx and nasopharynx clear.   LUNGS: Normal breath sounds bilaterally, no wheezing, rales,rhonchi or crepitation. No use of accessory muscles of respiration.  CARDIOVASCULAR: S1, S2 normal. No murmurs, rubs, or gallops.  ABDOMEN: Soft, non-tender, non-distended. Bowel sounds present. No organomegaly or mass.  EXTREMITIES: No pedal edema.  NEUROLOGIC: Cranial nerves II through XII are intact. Muscle strength 5/5 in all extremities.  PSYCHIATRIC: The patient is alert and oriented x 3.  SKIN: No obvious rash, lesion, or ulcer.   DATA REVIEW:   CBC Recent Labs  Lab 01/27/21 0450  WBC 10.1  HGB 12.7*  HCT 37.7*  PLT 149*    Chemistries  Recent Labs  Lab 01/26/21 1255 01/27/21 0450  NA 131* 134*  K 4.1 4.5  CL 97* 102  CO2 23 24  GLUCOSE 184* 288*  BUN 17 20  CREATININE 1.36* 0.97  CALCIUM 9.0 8.8*  AST 21  --   ALT 20  --   ALKPHOS 113  --   BILITOT 1.9*  --      Microbiology Results  Results for orders placed or performed during the hospital encounter of 01/26/21  Blood culture (routine x 2)     Status: None (Preliminary result)   Collection Time: 01/26/21 12:55 PM   Specimen: BLOOD  Result Value Ref Range Status   Specimen Description BLOOD RIGHT ANTECUBITAL  Final   Special Requests   Final    BOTTLES DRAWN AEROBIC AND ANAEROBIC Blood Culture adequate volume   Culture   Final    NO  GROWTH < 24 HOURS Performed at [facility], [address] [place], [place] [address]    Report Status PENDING  Incomplete  Resp Panel by RT-PCR (Flu A&B, Covid) Nasopharyngeal Swab     Status: None   Collection Time: 01/26/21 12:55 PM   Specimen: Nasopharyngeal Swab; Nasopharyngeal(NP) swabs in vial transport medium  Result Value Ref Range Status   SARS Coronavirus 2 by RT PCR NEGATIVE NEGATIVE Final    Comment: (NOTE) SARS-CoV-2 target nucleic acids are NOT DETECTED.  The SARS-CoV-2 RNA is generally detectable in upper respiratory specimens during the acute phase of infection. The lowest concentration of SARS-CoV-2 viral copies this assay can detect is 138 copies/mL. A negative result does not preclude SARS-Cov-2 infection and should not be used as the sole basis for treatment or other patient management decisions. A negative result may occur with  improper specimen collection/handling, submission of specimen other than nasopharyngeal swab, presence of viral mutation(s) within the areas targeted by this assay, and inadequate number of viral copies(<138 copies/mL). A negative result must be combined with clinical observations, patient history, and epidemiological information. The expected result is Negative.  Fact Sheet for Patients:  BloggerCourse.com  Fact Sheet for Healthcare Providers:  SeriousBroker.it  This test is no t yet approved or cleared by the [place] FDA and  has been authorized for detection and/or diagnosis of SARS-CoV-2 by FDA under an Emergency Use Authorization (EUA). This EUA will remain  in effect (meaning this test can be used) for the duration of the COVID-19 declaration under Section 564(b)(1) of the Act, 21 U.S.C.section 360bbb-3(b)(1), unless the authorization is terminated  or revoked sooner.       Influenza A by PCR NEGATIVE NEGATIVE Final   Influenza B by PCR NEGATIVE NEGATIVE  Final    Comment: (NOTE) The Xpert Xpress SARS-CoV-2/FLU/RSV plus assay is intended as an aid in the diagnosis of influenza from Nasopharyngeal swab specimens and should not be used as a sole basis for treatment. Nasal washings and aspirates are unacceptable for Xpert Xpress SARS-CoV-2/FLU/RSV testing.  Fact Sheet for Patients: BloggerCourse.com  Fact Sheet for Healthcare Providers: SeriousBroker.it  This test is not yet approved or cleared by the [place] FDA  and has been authorized for detection and/or diagnosis of SARS-CoV-2 by FDA under an Emergency Use Authorization (EUA). This EUA will remain in effect (meaning this test can be used) for the duration of the COVID-19 declaration under Section 564(b)(1) of the Act, 21 U.S.C. section 360bbb-3(b)(1), unless the authorization is terminated or revoked.  Performed at [facility], [address] [place], [place] [address]   Blood culture (routine x 2)     Status: None (Preliminary result)   Collection Time: 01/26/21  6:23 PM   Specimen: BLOOD  Result Value Ref Range Status   Specimen Description BLOOD LEFT ANTECUBITAL  Final   Special Requests   Final    BOTTLES DRAWN AEROBIC AND ANAEROBIC Blood Culture adequate volume   Culture   Final    NO GROWTH < 12 HOURS Performed at [facility], [address] [place], [place] [address]    Report Status PENDING  Incomplete    RADIOLOGY:  DG OR UROLOGY CYSTO IMAGE ([facility] ONLY)  Result Date: 01/26/2021 There is no interpretation for this exam.  This order is for images obtained during a surgical procedure.  Please See "Surgeries" Tab for more information regarding the procedure.   CT Renal Stone Study  Result Date: 01/26/2021 CLINICAL DATA:  Bilateral flank pain. History of urinary tract stones. EXAM: CT ABDOMEN AND PELVIS WITHOUT CONTRAST TECHNIQUE: Multidetector CT imaging of the abdomen and pelvis was  performed following the standard protocol without IV contrast. COMPARISON:  CT abdomen and pelvis 02/13/2018. FINDINGS: Lower chest: Lung bases clear.  No pleural or pericardial effusion. Hepatobiliary: No focal liver abnormality is seen. No gallstones, gallbladder wall thickening, or biliary dilatation. Pancreas: Unremarkable. No pancreatic ductal dilatation or surrounding inflammatory changes. Spleen: Normal in size without focal abnormality. Adrenals/Urinary Tract: The adrenal glands are negative. The patient has mild to moderate left hydronephrosis with extensive stranding about the left kidney and proximal ureter due to a 0.6 cm proximal left ureteral stone. A 0.5 cm nonobstructing stone is present in the upper pole of the right kidney. No hydronephrosis on the right. No other urinary tract stones are present on the right or left. Stomach/Bowel: Stomach is within normal limits. Appendix appears normal. No evidence of bowel wall thickening, distention, or inflammatory changes. Vascular/Lymphatic: Aortic atherosclerosis. No enlarged abdominal or pelvic lymph nodes. Reproductive: Mild prostatomegaly. Other: None. Musculoskeletal: No acute or focal abnormality. The patient is status post L3-4 discectomy. Degenerative disc disease L4-5 and L5-S1 noted. 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 Right hip osteoarthritis has markedly progressed since the prior CT. IMPRESSION: Mild-to-moderate left hydronephrosis due to a 0.6 cm proximal left ureteral stone. 0.5 cm nonobstructing stone right kidney. Right much worse than left hip osteoarthritis. Degenerative change on the right has worsened since the prior exam. Mild prostatomegaly. Aortic Atherosclerosis (ICD10-I70.0). Electronically Signed   By: Drusilla Kanner M.D.   On: 01/26/2021 14:49       Management plans discussed with the patient, family and they are in agreement.  CODE STATUS:     Code Status Orders  (From  admission, onward)           Start     Ordered   01/26/21 1546  Full code  Continuous        01/26/21 1547           Code Status History     Date Active Date Inactive Code Status Order ID Comments User Context  08/22/2020 1809 08/23/2020 0022 Full Code [id number]  Lyndle Herrlich, MD Inpatient   06/11/2019 1629 06/12/2019 1604 Full Code [id number]  Annice Needy, MD Inpatient   02/18/2019 1056 02/19/2019 1513 Full Code [id number]  Annice Needy, MD Inpatient       TOTAL TIME TAKING CARE OF THIS PATIENT: 33 minutes.    Alford Highland M.D on 01/27/2021 at 4:59 PM   [facility]  CC: Primary care physician; Erasmo Downer, MD

## 2021-01-27 NOTE — Progress Notes (Signed)
Urology Inpatient Progress Note  Subjective: No acute events overnight.  He is afebrile, VSS this morning. WBC count down today, 10.1.  Creatinine down today, 0.97.  Urine culture pending, blood cultures pending with no growth at <24 hours.  On antibiotics as below. Foley catheter in place draining pink urine. Today, patient reports feeling very well.  He denies flank pain and lower abdominal pain.  Anti-infectives: Anti-infectives (From admission, onward)    Start     Dose/Rate Route Frequency Ordered Stop   01/27/21 1500  cefTRIAXone (ROCEPHIN) 1 g in sodium chloride 0.9 % 100 mL IVPB        1 g 200 mL/hr over 30 Minutes Intravenous Every 24 hours 01/26/21 1547     01/26/21 1445  cefTRIAXone (ROCEPHIN) 1 g in sodium chloride 0.9 % 100 mL IVPB        1 g 200 mL/hr over 30 Minutes Intravenous  Once 01/26/21 1432 01/26/21 1530       Current Facility-Administered Medications  Medication Dose Route Frequency Provider Last Rate Last Admin   acetaminophen (TYLENOL) tablet 650 mg  650 mg Oral Q6H PRN Agbata, Tochukwu, MD   650 mg at 01/27/21 6962   Or   acetaminophen (TYLENOL) suppository 650 mg  650 mg Rectal Q6H PRN Agbata, Tochukwu, MD       amLODipine (NORVASC) tablet 10 mg  10 mg Oral Daily Agbata, Tochukwu, MD   10 mg at 01/27/21 0807   atorvastatin (LIPITOR) tablet 80 mg  80 mg Oral Daily Agbata, Tochukwu, MD   80 mg at 01/27/21 0807   cefTRIAXone (ROCEPHIN) 1 g in sodium chloride 0.9 % 100 mL IVPB  1 g Intravenous Q24H Agbata, Tochukwu, MD       Chlorhexidine Gluconate Cloth 2 % PADS 6 each  6 each Topical Daily Agbata, Tochukwu, MD   6 each at 01/27/21 0807   ezetimibe (ZETIA) tablet 10 mg  10 mg Oral Daily Agbata, Tochukwu, MD   10 mg at 01/27/21 0806   gabapentin (NEURONTIN) capsule 100 mg  100 mg Oral TID Agbata, Tochukwu, MD   100 mg at 01/27/21 9528   lactated ringers infusion   Intravenous Continuous Agbata, Tochukwu, MD 125 mL/hr at 01/27/21 0318 New Bag at 01/27/21 0318    lactated ringers infusion   Intravenous Continuous Piscitello, Cleda Mccreedy, MD 10 mL/hr at 01/26/21 1624 Restarted at 01/26/21 1642   morphine 2 MG/ML injection 2 mg  2 mg Intravenous Q4H PRN Agbata, Tochukwu, MD       ondansetron (ZOFRAN) tablet 4 mg  4 mg Oral Q6H PRN Agbata, Tochukwu, MD       Or   ondansetron (ZOFRAN) injection 4 mg  4 mg Intravenous Q6H PRN Agbata, Tochukwu, MD       pantoprazole (PROTONIX) EC tablet 40 mg  40 mg Oral Daily Agbata, Tochukwu, MD   40 mg at 01/27/21 0807   tamsulosin (FLOMAX) capsule 0.4 mg  0.4 mg Oral QPC supper Agbata, Tochukwu, MD   0.4 mg at 01/26/21 2027   vitamin B-12 (CYANOCOBALAMIN) tablet 500 mcg  500 mcg Oral Daily Agbata, Tochukwu, MD   500 mcg at 01/27/21 0807   Objective: Vital signs in last 24 hours: Temp:  [98 F (36.7 C)-102.9 F (39.4 C)] 98 F (36.7 C) (07/22 0755) Pulse Rate:  [60-126] 74 (07/22 0755) Resp:  [14-22] 17 (07/22 0755) BP: (94-141)/(59-87) 110/66 (07/22 0755) SpO2:  [95 %-100 %] 97 % (07/22 0755) Weight:  [80.3 kg]  80.3 kg (07/21 1551)  Intake/Output from previous day: 07/21 0701 - 07/22 0700 In: 1350 [P.O.:600; I.V.:250; IV Piggyback:500] Out: 2400 [Urine:2400] Intake/Output this shift: No intake/output data recorded.  Physical Exam Vitals and nursing note reviewed.  Constitutional:      General: He is not in acute distress.    Appearance: He is not ill-appearing, toxic-appearing or diaphoretic.  HENT:     Head: Normocephalic and atraumatic.  Pulmonary:     Effort: Pulmonary effort is normal. No respiratory distress.  Skin:    General: Skin is warm and dry.  Neurological:     Mental Status: He is alert and oriented to person, place, and time.  Psychiatric:        Mood and Affect: Mood normal.        Behavior: Behavior normal.   Lab Results:  Recent Labs    01/26/21 1255 01/27/21 0450  WBC 13.5* 10.1  HGB 14.2 12.7*  HCT 42.3 37.7*  PLT 153 149*   BMET Recent Labs    01/26/21 1255  01/27/21 0450  NA 131* 134*  K 4.1 4.5  CL 97* 102  CO2 23 24  GLUCOSE 184* 288*  BUN 17 20  CREATININE 1.36* 0.97  CALCIUM 9.0 8.8*   PT/INR Recent Labs    01/27/21 0450  LABPROT 14.7  INR 1.2   Studies/Results: DG OR UROLOGY CYSTO IMAGE ([facility] ONLY)  Result Date: 01/26/2021 There is no interpretation for this exam.  This order is for images obtained during a surgical procedure.  Please See "Surgeries" Tab for more information regarding the procedure.   CT Renal Stone Study  Result Date: 01/26/2021 CLINICAL DATA:  Bilateral flank pain. History of urinary tract stones. EXAM: CT ABDOMEN AND PELVIS WITHOUT CONTRAST TECHNIQUE: Multidetector CT imaging of the abdomen and pelvis was performed following the standard protocol without IV contrast. COMPARISON:  CT abdomen and pelvis 02/13/2018. FINDINGS: Lower chest: Lung bases clear.  No pleural or pericardial effusion. Hepatobiliary: No focal liver abnormality is seen. No gallstones, gallbladder wall thickening, or biliary dilatation. Pancreas: Unremarkable. No pancreatic ductal dilatation or surrounding inflammatory changes. Spleen: Normal in size without focal abnormality. Adrenals/Urinary Tract: The adrenal glands are negative. The patient has mild to moderate left hydronephrosis with extensive stranding about the left kidney and proximal ureter due to a 0.6 cm proximal left ureteral stone. A 0.5 cm nonobstructing stone is present in the upper pole of the right kidney. No hydronephrosis on the right. No other urinary tract stones are present on the right or left. Stomach/Bowel: Stomach is within normal limits. Appendix appears normal. No evidence of bowel wall thickening, distention, or inflammatory changes. Vascular/Lymphatic: Aortic atherosclerosis. No enlarged abdominal or pelvic lymph nodes. Reproductive: Mild prostatomegaly. Other: None. Musculoskeletal: No acute or focal abnormality. The patient is status post L3-4 discectomy. Degenerative  disc disease L4-5 and L5-S1 noted. 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 Right hip osteoarthritis has markedly progressed since the prior CT. IMPRESSION: Mild-to-moderate left hydronephrosis due to a 0.6 cm proximal left ureteral stone. 0.5 cm nonobstructing stone right kidney. Right much worse than left hip osteoarthritis. Degenerative change on the right has worsened since the prior exam. Mild prostatomegaly. Aortic Atherosclerosis (ICD10-I70.0). Electronically Signed   By: Drusilla Kanner M.D.   On: 01/26/2021 14:49    Assessment & Plan: 65 year old male POD 1 from left ureteral stent placement with Dr. Richardo Hanks for management of an obstructing, infected left UPJ stone.  Patient  clinically improving today on empiric antibiotics.  He is tolerating his stent well on Flomax.  Okay for discharge from the urologic perspective.  We discussed that he will require outpatient ureteroscopy with laser lithotripsy and stent exchange in 2 to 3 weeks.  Patient expressed understanding.  Recommendations: -Discontinue Foley cath (order placed) -Continue empiric antibiotics and follow urine cultures for total of 14 days of culture appropriate therapy -Recommend discharge on Flomax for prevention of stent symptoms -Outpatient URS/LL/stent exchange with Dr. Richardo Hanks in 2 to 3 weeks, our surgical scheduler will call him to arrange  Carman Ching, PA-C 01/27/2021

## 2021-01-27 NOTE — Care Management CC44 (Signed)
Condition Code 44 Documentation Completed  Patient Details  Name: Shawn Sherman MRN: [id number] Date of Birth: [birth year]   Condition Code 44 given:  Yes Patient signature on Condition Code 44 notice:  Yes (patient verbalized agreement and understanding, csw signed on behalf) Documentation of 2 MD's agreement:  Yes Code 44 added to claim:  Yes    Reuel Boom Robley [place]assa, LCSW 01/27/2021, 11:33 AM

## 2021-01-27 NOTE — Care Management Obs Status (Signed)
MEDICARE OBSERVATION STATUS NOTIFICATION   Patient Details  Name: SHALIN VONBARGEN MRN: [id number] Date of Birth: 17-Aug-1955   Medicare Observation Status Notification Given:  Yes    Reuel Boom Neilson Oehlert, LCSW 01/27/2021, 11:33 AM

## 2021-01-29 LAB — URINE CULTURE
Culture: 70000 — AB
Culture: >=100000 — AB

## 2021-01-29 NOTE — Progress Notes (Signed)
Patient ID: Shawn Sherman, male   DOB: [birth year], 65 y.o.   MRN: [id number]  Called in Augmentin 875 mg po bid for 14 days into [facility].  Spoke with patient about changing abx to augmentin and discontinuing keflex.  Patient having some discomfort and has tramadol at home that he can use.  Changing abx and flomax can also help. Keep hydrated.  Dr Alford Highland

## 2021-01-31 ENCOUNTER — Telehealth: Payer: Self-pay

## 2021-01-31 ENCOUNTER — Encounter: Payer: Self-pay | Admitting: *Deleted

## 2021-01-31 DIAGNOSIS — B952 Enterococcus as the cause of diseases classified elsewhere: Secondary | ICD-10-CM

## 2021-01-31 LAB — CULTURE, BLOOD (ROUTINE X 2)
Culture: NO GROWTH
Culture: NO GROWTH
Special Requests: ADEQUATE
Special Requests: ADEQUATE

## 2021-01-31 MED ORDER — AMOXICILLIN 500 MG PO TABS
500.0000 mg | ORAL_TABLET | Freq: Two times a day (BID) | ORAL | 0 refills | Status: AC
Start: 2021-01-31 — End: 2021-02-10

## 2021-01-31 NOTE — Telephone Encounter (Signed)
-----   Message from Sondra Come, MD sent at 01/31/2021 12:16 PM EDT ----- Please change him to amoxicillin 500mg  BID X 10 days and he can stop the keflex   , MD 01/31/2021

## 2021-01-31 NOTE — Telephone Encounter (Signed)
Called pt informed him of the information below. Pt gave verbal understanding. RX sent in.  °

## 2021-02-09 ENCOUNTER — Other Ambulatory Visit: Payer: Self-pay

## 2021-02-09 ENCOUNTER — Ambulatory Visit (INDEPENDENT_AMBULATORY_CARE_PROVIDER_SITE_OTHER): Payer: Medicare Other | Admitting: Family Medicine

## 2021-02-09 ENCOUNTER — Encounter: Payer: Self-pay | Admitting: Family Medicine

## 2021-02-09 VITALS — BP 121/99 | HR 93 | Temp 98.4°F | Resp 16 | Wt 164.0 lb

## 2021-02-09 DIAGNOSIS — N390 Urinary tract infection, site not specified: Secondary | ICD-10-CM | POA: Diagnosis not present

## 2021-02-09 DIAGNOSIS — N200 Calculus of kidney: Secondary | ICD-10-CM | POA: Diagnosis not present

## 2021-02-09 DIAGNOSIS — A419 Sepsis, unspecified organism: Secondary | ICD-10-CM | POA: Diagnosis not present

## 2021-02-09 DIAGNOSIS — N138 Other obstructive and reflux uropathy: Secondary | ICD-10-CM | POA: Diagnosis not present

## 2021-02-09 NOTE — Progress Notes (Signed)
Established patient visit   Patient: Shawn Sherman   DOB: 03/03/1956   65 y.o. Male  MRN: [id number] Visit Date: 02/09/2021  Today's healthcare provider: Dortha Kern, PA-C   No chief complaint on file.  Subjective    HPI  Follow up ER visit  Patient was seen in ER for kidney stones on 01/26/2021. He was treated for sepsis secondary to UTI and kidney stones. Treatment for this included cystoscopy and cephalexin 500mg   . He reports good compliance with treatment. He reports this condition is Improved.   Past Medical History:  Diagnosis Date   Anginal pain (HCC) 08/2018   a. 08/2018 - atyp c/p and neg trop; b. 08/2018 MV: EF 57%, inferolateral horizontal ST depression w/o ischemia or infarct.   Arthritis    Carotid arterial disease (HCC)    a. 01/2019 Carotid U/S: RICA 80-90, RCCA <50, RECA>50, LICA 80-99, LCCA>50, LECA>50; b. 01/2019 CT Head/Neck: RICA string sign in bulb - lumen <24mm. Sev prox RECA dzs. LICA 60 @ dist bulb. R vert 50-70, L vert 30-50.   Carotid bruit present 08/2018   left   Chewing tobacco nicotine dependence    Coronary artery disease    Essential hypertension 03/29/2017   GERD (gastroesophageal reflux disease)    History of 2019 novel coronavirus disease (COVID-19) 08/04/2020   History of kidney stones    Hyperlipidemia    Past Surgical History:  Procedure Laterality Date   ANTERIOR LAT LUMBAR FUSION Right 01/01/2013   Procedure: lumbar three-four extreme lateral interbody fusion;  Surgeon: 01/03/2013, MD;  Location: [facility];  Service: Neurosurgery;  Laterality: Right;  Lumbar three-four extreme lateral interbody fusion     BACK SURGERY  08   COLONOSCOPY     CYSTOSCOPY WITH STENT PLACEMENT Left 01/26/2021   Procedure: CYSTOSCOPY WITH STENT PLACEMENT;  Surgeon: 01/28/2021, MD;  Location: [facility];  Service: Urology;  Laterality: Left;   ENDARTERECTOMY Right 02/18/2019   Procedure: ENDARTERECTOMY CAROTID;  Surgeon: 04/20/2019, MD;   Location: [facility];  Service: Vascular;  Laterality: Right;   ENDARTERECTOMY Left 06/11/2019   Procedure: ENDARTERECTOMY CAROTID;  Surgeon: 14/09/2018, MD;  Location: [facility];  Service: Vascular;  Laterality: Left;   ESOPHAGOGASTRODUODENOSCOPY (EGD) WITH PROPOFOL N/A 07/05/2015   Procedure: ESOPHAGOGASTRODUODENOSCOPY (EGD) WITH PROPOFOL;  Surgeon: 07/07/2015, MD;  Location: [facility];  Service: Endoscopy;  Laterality: N/A;   EXTRACORPOREAL SHOCK WAVE LITHOTRIPSY Right 09/11/2018   Procedure: EXTRACORPOREAL SHOCK WAVE LITHOTRIPSY (ESWL);  Surgeon: 11/11/2018, MD;  Location: [facility];  Service: Urology;  Laterality: Right;   LUMBAR PERCUTANEOUS PEDICLE SCREW 1 LEVEL N/A 01/01/2013   Procedure: lumbar three-four percutaneous pedicle screws ;  Surgeon: 01/03/2013, MD;  Location: [facility];  Service: Neurosurgery;  Laterality: N/A;  lumbar three-four percutaneous pedicle screws   SHOULDER ARTHROSCOPY WITH ROTATOR CUFF REPAIR Left 08/22/2020   Procedure: SHOULDER ARTHROSCOPY WITH ROTATOR CUFF REPAIR AND DEBRIDEMENT;  Surgeon: 08/24/2020, MD;  Location: [facility];  Service: Orthopedics;  Laterality: Left;   Social History   Tobacco Use   Smoking status: Never   Smokeless tobacco: Current    Types: Chew  Vaping Use   Vaping Use: Never used  Substance Use Topics   Alcohol use: No   Drug use: No   Family Status  Relation Name Status   Mother  Deceased   Father  Deceased   Sister  (Not Specified)  No Known Allergies     Medications: Outpatient Medications Prior to Visit  Medication Sig   amLODipine (NORVASC) 10 MG tablet Take 1 tablet (10 mg total) by mouth daily.   amoxicillin (AMOXIL) 500 MG tablet Take 1 tablet (500 mg total) by mouth 2 (two) times daily for 10 days.   aspirin 81 MG chewable tablet Chew 81 mg by mouth daily.   atorvastatin (LIPITOR) 80 MG tablet TAKE 1 TABLET BY MOUTH ONCE DAILY   clopidogrel (PLAVIX) 75 MG tablet TAKE 1 TABLET BY MOUTH ONCE  DAILY WITH BREAKFAST   esomeprazole (NEXIUM) 40 MG capsule TAKE 1 CAPSULE BY MOUTH ONCE EVERY EVENING   ezetimibe (ZETIA) 10 MG tablet TAKE 1 TABLET BY MOUTH ONCE DAILY   gabapentin (NEURONTIN) 100 MG capsule TAKE 1 CAPSULE BY MOUTH 3 TIMES DAILY   Multiple Vitamins-Minerals (AIRBORNE PO) Take 1 tablet by mouth daily.   tamsulosin (FLOMAX) 0.4 MG CAPS capsule Take 1 capsule (0.4 mg total) by mouth daily after supper.   traMADol (ULTRAM) 50 MG tablet Take 50 mg by mouth every 6 (six) hours as needed for moderate pain.   vitamin B-12 (CYANOCOBALAMIN) 500 MCG tablet Take 500 mcg by mouth daily.    No facility-administered medications prior to visit.   Review of Systems  Constitutional: Negative.   HENT: Negative.    Respiratory: Negative.    Cardiovascular: Negative.   Gastrointestinal:  Positive for constipation. Negative for vomiting.  Genitourinary:  Positive for dysuria, frequency, hematuria and urgency.      Objective    BP (!) 121/99   Pulse 93   Temp 98.4 F (36.9 C)   Resp 16   Wt 164 lb (74.4 kg)   BMI 23.53 kg/m  BP Readings from Last 3 Encounters:  02/09/21 (!) 121/99  01/27/21 110/68  09/12/20 104/70   Wt Readings from Last 3 Encounters:  02/09/21 164 lb (74.4 kg)  01/26/21 177 lb 0.5 oz (80.3 kg)  09/12/20 178 lb (80.7 kg)   Physical Exam Constitutional:      General: He is not in acute distress.    Appearance: He is well-developed.  HENT:     Head: Normocephalic and atraumatic.     Right Ear: Hearing normal.     Left Ear: Hearing normal.     Nose: Nose normal.  Eyes:     General: Lids are normal. No scleral icterus.       Right eye: No discharge.        Left eye: No discharge.     Conjunctiva/sclera: Conjunctivae normal.  Cardiovascular:     Rate and Rhythm: Normal rate and regular rhythm.     Pulses: Normal pulses.     Heart sounds: Normal heart sounds.  Pulmonary:     Effort: Pulmonary effort is normal. No respiratory distress.     Breath  sounds: Normal breath sounds.  Abdominal:     General: Bowel sounds are normal.     Palpations: Abdomen is soft.     Tenderness: There is abdominal tenderness.     Comments: Suprapubic and left flank pain intermittently.  Musculoskeletal:        General: Normal range of motion.     Cervical back: Neck supple.  Skin:    Findings: No lesion or rash.  Neurological:     Mental Status: He is alert and oriented to person, place, and time.  Psychiatric:        Speech: Speech normal.  Behavior: Behavior normal.        Thought Content: Thought content normal.     No results found for any visits on 02/09/21.  Assessment & Plan     1. Urinary tract obstruction due to kidney stone Had hospitalizations from 01-26-21 to 01-27-21 for obstructing kidney stone with urgent stent procedure by Dr. Richardo Hanks (urologist). Given Flomax and acute kidney injury improved with IV fluids and stent. Creatinine went from 1.36 to 0.97. CT of abdomen and pelvis identified a 6 mm stone in the proximal left ureter and hydronephrosis. Also, a 5 mm non-obstructing stone upper pole of the right kidney. Recheck renal function and CBC. - CBC with Differential/Platelet - Basic Metabolic Panel (BMET)  2. Sepsis secondary to UTI (HCC) Treated with Keflex 500 mg TID for 12 days and will follow up with Dr. Richardo Hanks for stent removal and evaluation for possible lithotripsy. Continue to drink extra fluids to flush out infection and possibly the stones. - CBC with Differential/Platelet - Basic Metabolic Panel (BMET)    No follow-ups on file.      I, Morgon Pamer, PA-C, have reviewed all documentation for this visit. The documentation on 02/09/21 for the exam, diagnosis, procedures, and orders are all accurate and complete.    Dortha Kern, PA-C  [facility] [phone] (phone) [phone] (fax)  [facility]

## 2021-02-10 LAB — BASIC METABOLIC PANEL
BUN/Creatinine Ratio: 18 (ref 10–24)
BUN: 18 mg/dL (ref 8–27)
CO2: 25 mmol/L (ref 20–29)
Calcium: 9.3 mg/dL (ref 8.6–10.2)
Chloride: 100 mmol/L (ref 96–106)
Creatinine, Ser: 0.98 mg/dL (ref 0.76–1.27)
Glucose: 180 mg/dL — ABNORMAL HIGH (ref 65–99)
Potassium: 4.9 mmol/L (ref 3.5–5.2)
Sodium: 138 mmol/L (ref 134–144)
eGFR: 86 mL/min/{1.73_m2} (ref >59)

## 2021-02-10 LAB — CBC WITH DIFFERENTIAL/PLATELET
Basophils Absolute: 0.1 10*3/uL (ref 0.0–0.2)
Basos: 1 %
EOS (ABSOLUTE): 0.2 10*3/uL (ref 0.0–0.4)
Eos: 2 %
Hematocrit: 37.6 % (ref 37.5–51.0)
Hemoglobin: 13.1 g/dL (ref 13.0–17.7)
Immature Grans (Abs): 0 10*3/uL (ref 0.0–0.1)
Immature Granulocytes: 0 %
Lymphocytes Absolute: 1.1 10*3/uL (ref 0.7–3.1)
Lymphs: 14 %
MCH: 28 pg (ref 26.6–33.0)
MCHC: 34.8 g/dL (ref 31.5–35.7)
MCV: 80 fL (ref 79–97)
Monocytes Absolute: 0.7 10*3/uL (ref 0.1–0.9)
Monocytes: 9 %
Neutrophils Absolute: 5.9 10*3/uL (ref 1.4–7.0)
Neutrophils: 74 %
Platelets: 313 10*3/uL (ref 150–450)
RBC: 4.68 x10E6/uL (ref 4.14–5.80)
RDW: 13 % (ref 11.6–15.4)
WBC: 7.9 10*3/uL (ref 3.4–10.8)

## 2021-02-13 ENCOUNTER — Other Ambulatory Visit: Payer: Self-pay | Admitting: Family

## 2021-02-13 ENCOUNTER — Other Ambulatory Visit (INDEPENDENT_AMBULATORY_CARE_PROVIDER_SITE_OTHER): Payer: Self-pay | Admitting: Vascular Surgery

## 2021-02-13 ENCOUNTER — Encounter: Payer: Self-pay | Admitting: Family Medicine

## 2021-02-13 DIAGNOSIS — I10 Essential (primary) hypertension: Secondary | ICD-10-CM

## 2021-02-14 ENCOUNTER — Other Ambulatory Visit: Payer: Self-pay | Admitting: Urology

## 2021-02-14 DIAGNOSIS — N201 Calculus of ureter: Secondary | ICD-10-CM

## 2021-02-16 ENCOUNTER — Encounter
Admission: RE | Admit: 2021-02-16 | Discharge: 2021-02-16 | Disposition: A | Discharge status: Home / Self Care | Payer: Medicare Other | Source: Ambulatory Visit | Attending: Urology | Admitting: Urology

## 2021-02-16 ENCOUNTER — Other Ambulatory Visit: Payer: Self-pay

## 2021-02-16 HISTORY — DX: Headache, unspecified: R51.9

## 2021-02-16 NOTE — Patient Instructions (Addendum)
Your procedure is scheduled on:02-17-21 Friday Report to the Registration Desk on the 1st floor of the Medical Mall-Then proceed to the 2nd floor Surgery Desk in the Medical Mall To find out your arrival time, please call [phone] between 1PM - 3PM on:02-16-21 Thursday  REMEMBER: Instructions that are not followed completely may result in serious medical risk, up to and including death; or upon the discretion of your surgeon and anesthesiologist your surgery may need to be rescheduled.  Do not eat food OR drink any liquids after midnight the night before surgery.  No gum chewing, lozengers or hard candies.  TAKE THESE MEDICATIONS THE MORNING OF SURGERY WITH A SIP OF WATER: -Gabapentin (Neurontin) -Nexium (Esomeprazole) -Norvasc (Amlodipine) -Zetia (Ezetimibe) -You may take Tramadol (Ultram) the day of surgery if needed  81 mg Aspirin was stopped weeks ago per patient due to prior surgery  Continue your Plavix per Dr Richardo Hanks but do not take Plavix the day of surgery  One week prior to surgery: Stop Anti-inflammatories (NSAIDS) such as Advil, Aleve, Ibuprofen, Motrin, Naproxen, Naprosyn and Aspirin based products such as Excedrin, Goodys Powder, BC Powder.You may however, continue to take Tylenol or Tramadol if needed for pain   Stop ANY OVER THE COUNTER supplements/vitamins NOW (02-16-21)until after surgery.(Tamaflex, Vitamin B12)  No Alcohol for 24 hours before or after surgery.  No Smoking including e-cigarettes for 24 hours prior to surgery.  No chewable tobacco products for at least 6 hours prior to surgery.  No nicotine patches on the day of surgery.  Do not use any "recreational" drugs for at least a week prior to your surgery.  Please be advised that the combination of cocaine and anesthesia may have negative outcomes, up to and including death. If you test positive for cocaine, your surgery will be cancelled.  On the morning of surgery brush your teeth with toothpaste  and water, you may rinse your mouth with mouthwash if you wish. Do not swallow any toothpaste or mouthwash.  Do not wear jewelry, make-up, hairpins, clips or nail polish.  Do not wear lotions, powders, or perfumes.   Do not shave body from the neck down 48 hours prior to surgery just in case you cut yourself which could leave a site for infection.  Also, freshly shaved skin may become irritated if using the CHG soap.  Contact lenses, hearing aids and dentures may not be worn into surgery.  Do not bring valuables to the hospital. [facility] is not responsible for any missing/lost belongings or valuables.   Notify your doctor if there is any change in your medical condition (cold, fever, infection).  Wear comfortable clothing (specific to your surgery type) to the hospital.  After surgery, you can help prevent lung complications by doing breathing exercises.  Take deep breaths and cough every 1-2 hours. Your doctor may order a device called an Incentive Spirometer to help you take deep breaths. When coughing or sneezing, hold a pillow firmly against your incision with both hands. This is called "splinting." Doing this helps protect your incision. It also decreases belly discomfort.  If you are being admitted to the hospital overnight, leave your suitcase in the car. After surgery it may be brought to your room.  If you are being discharged the day of surgery, you will not be allowed to drive home. You will need a responsible adult (18 years or older) to drive you home and stay with you that night.   If you are taking public transportation,  you will need to have a responsible adult (18 years or older) with you. Please confirm with your physician that it is acceptable to use public transportation.   Please call the Pre-admissions Testing Dept. at [phone] if you have any questions about these instructions.  Surgery Visitation Policy:  Patients undergoing a surgery or procedure  may have one family member or support person with them as long as that person is not COVID-19 positive or experiencing its symptoms.  That person may remain in the waiting area during the procedure.  Inpatient Visitation:    Visiting hours are 7 a.m. to 8 p.m. Inpatients will be allowed two visitors daily. The visitors may change each day during the patient's stay. No visitors under the age of 81. Any visitor under the age of 67 must be accompanied by an adult. The visitor must pass COVID-19 screenings, use hand sanitizer when entering and exiting the patient's room and wear a mask at all times, including in the patient's room. Patients must also wear a mask when staff or their visitor are in the room. Masking is required regardless of vaccination status.

## 2021-02-17 ENCOUNTER — Encounter
Admission: RE | Disposition: A | Discharge status: Home / Self Care | Payer: Self-pay | Source: Home / Self Care | Attending: Urology

## 2021-02-17 ENCOUNTER — Encounter: Payer: Self-pay | Admitting: Urology

## 2021-02-17 ENCOUNTER — Other Ambulatory Visit: Payer: Self-pay | Admitting: Urology

## 2021-02-17 ENCOUNTER — Ambulatory Visit
Admission: RE | Admit: 2021-02-17 | Discharge: 2021-02-17 | Disposition: A | Discharge status: Home / Self Care | Payer: Medicare Other | Attending: Urology | Admitting: Urology

## 2021-02-17 DIAGNOSIS — Z5309 Procedure and treatment not carried out because of other contraindication: Secondary | ICD-10-CM | POA: Insufficient documentation

## 2021-02-17 DIAGNOSIS — N201 Calculus of ureter: Secondary | ICD-10-CM | POA: Insufficient documentation

## 2021-02-17 LAB — URINALYSIS, COMPLETE (UACMP) WITH MICROSCOPIC
Bilirubin Urine: NEGATIVE
Glucose, UA: NEGATIVE mg/dL
Ketones, ur: NEGATIVE mg/dL
Nitrite: NEGATIVE
Protein, ur: 100 mg/dL — AB
RBC / HPF: >50 RBC/hpf — ABNORMAL HIGH (ref 0–5)
Specific Gravity, Urine: 1.021 (ref 1.005–1.030)
WBC, UA: >50 WBC/hpf — ABNORMAL HIGH (ref 0–5)
pH: 5 (ref 5.0–8.0)

## 2021-02-17 SURGERY — CYSTOSCOPY/URETEROSCOPY/HOLMIUM LASER/STENT PLACEMENT
Anesthesia: Choice | Laterality: Left

## 2021-02-17 MED ORDER — ORAL CARE MOUTH RINSE: 15.0000 mL | Freq: Once | OROMUCOSAL | Status: DC

## 2021-02-17 MED ORDER — AMOXICILLIN-POT CLAVULANATE 875-125 MG PO TABS
1.0000 | ORAL_TABLET | Freq: Once | ORAL | Status: DC
  Filled 2021-02-17: qty 1

## 2021-02-17 MED ORDER — LACTATED RINGERS IV SOLN: INTRAVENOUS | Status: DC

## 2021-02-17 MED ORDER — AMOXICILLIN-POT CLAVULANATE 875-125 MG PO TABS: 1.0000 | ORAL_TABLET | Freq: Two times a day (BID) | ORAL | 0 refills | Status: DC

## 2021-02-17 MED ORDER — CHLORHEXIDINE GLUCONATE 0.12 % MT SOLN: 15.0000 mL | Freq: Once | OROMUCOSAL | Status: DC

## 2021-02-17 MED ORDER — CEFAZOLIN SODIUM-DEXTROSE 2-4 GM/100ML-% IV SOLN: 2.0000 g | INTRAVENOUS | Status: DC

## 2021-02-17 MED ORDER — OXYBUTYNIN CHLORIDE ER 10 MG PO TB24: 10.0000 mg | ORAL_TABLET | Freq: Every day | ORAL | 0 refills | Status: DC

## 2021-02-17 SURGICAL SUPPLY — 30 items
BAG DRAIN CYSTO-URO LG1000N (MISCELLANEOUS) ×2 IMPLANT
BRUSH SCRUB EZ 1% IODOPHOR (MISCELLANEOUS) ×2 IMPLANT
CATH URET FLEX-TIP 2 LUMEN 10F (CATHETERS) IMPLANT
CATH URETL OPEN 5X70 (CATHETERS) IMPLANT
CNTNR SPEC 2.5X3XGRAD LEK (MISCELLANEOUS)
CONT SPEC 4OZ STER OR WHT (MISCELLANEOUS)
CONT SPEC 4OZ STRL OR WHT (MISCELLANEOUS)
CONTAINER SPEC 2.5X3XGRAD LEK (MISCELLANEOUS) IMPLANT
DRAPE UTILITY 15X26 TOWEL STRL (DRAPES) ×2 IMPLANT
DRSG TEGADERM 2-3/8X2-3/4 SM (GAUZE/BANDAGES/DRESSINGS) ×2 IMPLANT
GAUZE 4X4 16PLY ~~LOC~~+RFID DBL (SPONGE) ×4 IMPLANT
GLOVE SURG UNDER POLY LF SZ7.5 (GLOVE) ×2 IMPLANT
GOWN STRL REUS W/ TWL LRG LVL3 (GOWN DISPOSABLE) ×1 IMPLANT
GOWN STRL REUS W/ TWL XL LVL3 (GOWN DISPOSABLE) ×1 IMPLANT
GOWN STRL REUS W/TWL LRG LVL3 (GOWN DISPOSABLE) ×1
GOWN STRL REUS W/TWL XL LVL3 (GOWN DISPOSABLE) ×2
GUIDEWIRE STR DUAL SENSOR (WIRE) ×2 IMPLANT
INFUSOR MANOMETER BAG 3000ML (MISCELLANEOUS) ×2 IMPLANT
IV NS IRRIG 3000ML ARTHROMATIC (IV SOLUTION) ×2 IMPLANT
KIT TURNOVER CYSTO (KITS) ×2 IMPLANT
PACK CYSTO AR (MISCELLANEOUS) ×2 IMPLANT
SET CYSTO W/LG BORE CLAMP LF (SET/KITS/TRAYS/PACK) ×2 IMPLANT
SHEATH URETERAL 12FRX35CM (MISCELLANEOUS) IMPLANT
STENT URET 6FRX24 CONTOUR (STENTS) IMPLANT
STENT URET 6FRX26 CONTOUR (STENTS) IMPLANT
SURGILUBE 2OZ TUBE FLIPTOP (MISCELLANEOUS) ×2 IMPLANT
SYR 10ML LL (SYRINGE) ×2 IMPLANT
TRACTIP FLEXIVA PULSE ID 200 (Laser) IMPLANT
VALVE UROSEAL ADJ ENDO (VALVE) IMPLANT
WATER STERILE IRR 1000ML POUR (IV SOLUTION) ×2 IMPLANT

## 2021-02-17 NOTE — Progress Notes (Signed)
Patient given an out of work note per Dr. Richardo Hanks.  Refuses to stay for PO augmentin.  "I have that at home because they put me on that at first and then switched it to amoxicillin." Patient states that he will take an augmentin as soon as he gets home.  Discharged with wife.

## 2021-02-17 NOTE — Progress Notes (Signed)
Pt temp 102.6 orally. Dr. Priscella Mann spoke with Dr. Richardo Hanks of temperature and due to risk of urinary infection, surgery canceled.

## 2021-02-17 NOTE — Progress Notes (Signed)
   02/17/2021 11:41 AM   Shawn Sherman [birth year] [id number]  64 year old male who originally presented on 01/26/2021 with sepsis from urinary source and a 1 cm left proximal ureteral stone with upstream hydronephrosis and underwent urgent left ureteral stent placement at that time.  Urine culture ultimately grew Enterococcus.  He was discharged by the hospitalist on Keflex, but I change this to culture appropriate amoxicillin on 02/01/2019 22 x 10 days.  In preop today he has a fever of 102.6, with reported chills last night.  Urinalysis suspicious for infection with greater than 50 RBCs, greater than 50 WBCs, WBC clumps, and large leukocytes, nitrite negative, rare bacteria.  Sent for culture.  We discussed options including admission for antibiotics and fluids, versus outpatient management with antibiotics for suspected recurrent UTI.  He opts for outpatient management.  Risks and benefits discussed at length.  -Augmentin x14 days, follow-up urine culture -Tentatively schedule for left ureteroscopy, laser lithotripsy, stent placement next Friday after at least 1 week of culture appropriate antibiotics.  If antibiotic needs to be changed likely to for at least another week for urine sterilization -Oxybutynin 10 mg XL added for stent related urgency/frequency  [facility] [address] [place], [place] [address] [phone]

## 2021-02-19 LAB — URINE CULTURE: Culture: >=100000 — AB

## 2021-02-20 ENCOUNTER — Encounter: Payer: Self-pay | Admitting: Certified Registered"

## 2021-02-20 ENCOUNTER — Other Ambulatory Visit: Payer: Self-pay | Admitting: Urology

## 2021-02-23 ENCOUNTER — Other Ambulatory Visit: Payer: Self-pay | Admitting: Family Medicine

## 2021-02-23 DIAGNOSIS — M7061 Trochanteric bursitis, right hip: Secondary | ICD-10-CM

## 2021-02-23 NOTE — Telephone Encounter (Signed)
LOV 02/09/2021 NOV None

## 2021-02-23 NOTE — Telephone Encounter (Signed)
Requested medication (s) are due for refill today: no  Requested medication (s) are on the active medication list: yes  Future visit scheduled: no  Notes to clinic:  Patient last seen for hip pain on 05/03/2020 Review for refill    Requested Prescriptions  Pending Prescriptions Disp Refills   gabapentin (NEURONTIN) 100 MG capsule [Pharmacy Med Name: GABAPENTIN 100 MG CAP] 90 capsule 1    Sig: TAKE 1 CAPSULE BY MOUTH 3 TIMES DAILY     Neurology: Anticonvulsants - gabapentin Passed - 02/23/2021 11:49 AM      Passed - Valid encounter within last 12 months    Recent Outpatient Visits           2 weeks ago Urinary tract obstruction due to kidney stone   [facility], Jodell Cipro, PA-C   9 months ago Right hip pain   [facility] Chrismon, Jodell Cipro, PA-C   1 year ago Essential hypertension   [facility], Marzella Schlein, MD   3 years ago Right flank pain   [facility], Marzella Schlein, MD   3 years ago Patellofemoral pain syndrome of right knee   [facility] [place], [place]

## 2021-02-24 ENCOUNTER — Ambulatory Visit: Payer: Medicare Other

## 2021-02-24 ENCOUNTER — Ambulatory Visit
Admission: RE | Admit: 2021-02-24 | Discharge: 2021-02-24 | Disposition: A | Discharge status: Home / Self Care | Payer: Medicare Other | Attending: Urology | Admitting: Urology

## 2021-02-24 ENCOUNTER — Encounter
Admission: RE | Disposition: A | Discharge status: Home / Self Care | Payer: Self-pay | Source: Home / Self Care | Attending: Urology

## 2021-02-24 ENCOUNTER — Other Ambulatory Visit: Payer: Self-pay

## 2021-02-24 ENCOUNTER — Encounter: Payer: Self-pay | Admitting: Urology

## 2021-02-24 DIAGNOSIS — Z825 Family history of asthma and other chronic lower respiratory diseases: Secondary | ICD-10-CM | POA: Insufficient documentation

## 2021-02-24 DIAGNOSIS — Z8616 Personal history of COVID-19: Secondary | ICD-10-CM | POA: Insufficient documentation

## 2021-02-24 DIAGNOSIS — F17220 Nicotine dependence, chewing tobacco, uncomplicated: Secondary | ICD-10-CM | POA: Insufficient documentation

## 2021-02-24 DIAGNOSIS — K219 Gastro-esophageal reflux disease without esophagitis: Secondary | ICD-10-CM | POA: Diagnosis not present

## 2021-02-24 DIAGNOSIS — N200 Calculus of kidney: Secondary | ICD-10-CM | POA: Diagnosis not present

## 2021-02-24 DIAGNOSIS — Z87440 Personal history of urinary (tract) infections: Secondary | ICD-10-CM | POA: Diagnosis not present

## 2021-02-24 DIAGNOSIS — Z809 Family history of malignant neoplasm, unspecified: Secondary | ICD-10-CM | POA: Diagnosis not present

## 2021-02-24 DIAGNOSIS — N132 Hydronephrosis with renal and ureteral calculous obstruction: Secondary | ICD-10-CM | POA: Insufficient documentation

## 2021-02-24 DIAGNOSIS — Z87442 Personal history of urinary calculi: Secondary | ICD-10-CM | POA: Diagnosis not present

## 2021-02-24 DIAGNOSIS — I2510 Atherosclerotic heart disease of native coronary artery without angina pectoris: Secondary | ICD-10-CM | POA: Diagnosis not present

## 2021-02-24 DIAGNOSIS — N201 Calculus of ureter: Secondary | ICD-10-CM | POA: Diagnosis not present

## 2021-02-24 DIAGNOSIS — N400 Enlarged prostate without lower urinary tract symptoms: Secondary | ICD-10-CM | POA: Insufficient documentation

## 2021-02-24 DIAGNOSIS — N138 Other obstructive and reflux uropathy: Secondary | ICD-10-CM

## 2021-02-24 HISTORY — PX: CYSTOSCOPY/URETEROSCOPY/HOLMIUM LASER/STENT PLACEMENT: SHX6546

## 2021-02-24 SURGERY — CYSTOSCOPY/URETEROSCOPY/HOLMIUM LASER/STENT PLACEMENT
Anesthesia: General | Laterality: Left

## 2021-02-24 MED ORDER — DEXMEDETOMIDINE (PRECEDEX) IN NS 20 MCG/5ML (4 MCG/ML) IV SYRINGE
PREFILLED_SYRINGE | INTRAVENOUS | Status: DC | PRN
  Administered 2021-02-24 (×2): 4 ug via INTRAVENOUS

## 2021-02-24 MED ORDER — LIDOCAINE HCL (CARDIAC) PF 100 MG/5ML IV SOSY
PREFILLED_SYRINGE | INTRAVENOUS | Status: DC | PRN
  Administered 2021-02-24: 80 mg via INTRAVENOUS

## 2021-02-24 MED ORDER — 0.9 % SODIUM CHLORIDE (POUR BTL) OPTIME
TOPICAL | Status: DC | PRN
  Administered 2021-02-24: 100 mL

## 2021-02-24 MED ORDER — BELLADONNA ALKALOIDS-OPIUM 16.2-60 MG RE SUPP
RECTAL | Status: DC | PRN
  Administered 2021-02-24: 1 via RECTAL

## 2021-02-24 MED ORDER — PROPOFOL 10 MG/ML IV BOLUS
INTRAVENOUS | Status: DC | PRN
  Administered 2021-02-24: 120 mg via INTRAVENOUS

## 2021-02-24 MED ORDER — LACTATED RINGERS IV SOLN: INTRAVENOUS | Status: DC

## 2021-02-24 MED ORDER — ROCURONIUM BROMIDE 100 MG/10ML IV SOLN
INTRAVENOUS | Status: DC | PRN
  Administered 2021-02-24: 50 mg via INTRAVENOUS

## 2021-02-24 MED ORDER — ORAL CARE MOUTH RINSE: 15.0000 mL | Freq: Once | OROMUCOSAL | Status: AC

## 2021-02-24 MED ORDER — LIDOCAINE HCL (PF) 2 % IJ SOLN
INTRAMUSCULAR | Status: AC
  Filled 2021-02-24: qty 5

## 2021-02-24 MED ORDER — PROPOFOL 10 MG/ML IV BOLUS
INTRAVENOUS | Status: AC
  Filled 2021-02-24: qty 20

## 2021-02-24 MED ORDER — FENTANYL CITRATE (PF) 100 MCG/2ML IJ SOLN
INTRAMUSCULAR | Status: DC | PRN
  Administered 2021-02-24 (×2): 50 ug via INTRAVENOUS

## 2021-02-24 MED ORDER — CEFAZOLIN SODIUM-DEXTROSE 2-4 GM/100ML-% IV SOLN
2.0000 g | Freq: Once | INTRAVENOUS | Status: AC
  Administered 2021-02-24: 2 g via INTRAVENOUS

## 2021-02-24 MED ORDER — SUGAMMADEX SODIUM 200 MG/2ML IV SOLN
INTRAVENOUS | Status: DC | PRN
  Administered 2021-02-24: 200 mg via INTRAVENOUS

## 2021-02-24 MED ORDER — FENTANYL CITRATE (PF) 100 MCG/2ML IJ SOLN
INTRAMUSCULAR | Status: AC
  Filled 2021-02-24: qty 2

## 2021-02-24 MED ORDER — MIDAZOLAM HCL 2 MG/2ML IJ SOLN
INTRAMUSCULAR | Status: AC
  Filled 2021-02-24: qty 2

## 2021-02-24 MED ORDER — IOHEXOL 180 MG/ML  SOLN
INTRAMUSCULAR | Status: DC | PRN
  Administered 2021-02-24: 5 mL

## 2021-02-24 MED ORDER — MIDAZOLAM HCL 2 MG/2ML IJ SOLN
INTRAMUSCULAR | Status: DC | PRN
  Administered 2021-02-24: 1 mg via INTRAVENOUS

## 2021-02-24 MED ORDER — CEFAZOLIN SODIUM-DEXTROSE 2-4 GM/100ML-% IV SOLN
INTRAVENOUS | Status: AC
  Filled 2021-02-24: qty 100

## 2021-02-24 MED ORDER — KETOROLAC TROMETHAMINE 30 MG/ML IJ SOLN
INTRAMUSCULAR | Status: DC | PRN
  Administered 2021-02-24: 15 mg via INTRAVENOUS

## 2021-02-24 MED ORDER — PHENYLEPHRINE HCL (PRESSORS) 10 MG/ML IV SOLN
INTRAVENOUS | Status: DC | PRN
  Administered 2021-02-24: 50 ug via INTRAVENOUS

## 2021-02-24 MED ORDER — CHLORHEXIDINE GLUCONATE 0.12 % MT SOLN: 15.0000 mL | Freq: Once | OROMUCOSAL | Status: AC

## 2021-02-24 MED ORDER — ONDANSETRON HCL 4 MG/2ML IJ SOLN
INTRAMUSCULAR | Status: DC | PRN
Start: 2021-02-24 — End: 2021-02-24
  Administered 2021-02-24: 4 mg via INTRAVENOUS

## 2021-02-24 MED ORDER — VANCOMYCIN HCL 1000 MG IV SOLR
INTRAVENOUS | Status: DC | PRN
  Administered 2021-02-24: 1000 mg via INTRAVENOUS

## 2021-02-24 MED ORDER — DEXAMETHASONE SODIUM PHOSPHATE 10 MG/ML IJ SOLN
INTRAMUSCULAR | Status: DC | PRN
  Administered 2021-02-24: 10 mg via INTRAVENOUS

## 2021-02-24 MED ORDER — CHLORHEXIDINE GLUCONATE 0.12 % MT SOLN
OROMUCOSAL | Status: AC
  Administered 2021-02-24: 15 mL via OROMUCOSAL
  Filled 2021-02-24: qty 15

## 2021-02-24 MED ORDER — BELLADONNA ALKALOIDS-OPIUM 16.2-60 MG RE SUPP
RECTAL | Status: AC
  Filled 2021-02-24: qty 1

## 2021-02-24 MED ORDER — VANCOMYCIN HCL IN DEXTROSE 1-5 GM/200ML-% IV SOLN
INTRAVENOUS | Status: AC
  Filled 2021-02-24: qty 200

## 2021-02-24 MED ORDER — HYDROCODONE-ACETAMINOPHEN 5-325 MG PO TABS: 1.0000 | ORAL_TABLET | ORAL | 0 refills | Status: AC | PRN

## 2021-02-24 SURGICAL SUPPLY — 33 items
ADH LQ OCL WTPRF AMP STRL LF (MISCELLANEOUS) ×1
ADHESIVE MASTISOL STRL (MISCELLANEOUS) ×2 IMPLANT
BAG DRAIN CYSTO-URO LG1000N (MISCELLANEOUS) ×2 IMPLANT
BRUSH SCRUB EZ 1% IODOPHOR (MISCELLANEOUS) ×2 IMPLANT
CATH URET FLEX-TIP 2 LUMEN 10F (CATHETERS) IMPLANT
CATH URETL OPEN 5X70 (CATHETERS) IMPLANT
CNTNR SPEC 2.5X3XGRAD LEK (MISCELLANEOUS)
CONT SPEC 4OZ STER OR WHT (MISCELLANEOUS)
CONT SPEC 4OZ STRL OR WHT (MISCELLANEOUS)
CONTAINER SPEC 2.5X3XGRAD LEK (MISCELLANEOUS) IMPLANT
DRAPE UTILITY 15X26 TOWEL STRL (DRAPES) ×2 IMPLANT
DRSG TEGADERM 2-3/8X2-3/4 SM (GAUZE/BANDAGES/DRESSINGS) ×2 IMPLANT
FIBER LASER MOSES 200 DFL (Laser) ×2 IMPLANT
GAUZE 4X4 16PLY ~~LOC~~+RFID DBL (SPONGE) ×4 IMPLANT
GLOVE SURG UNDER POLY LF SZ7.5 (GLOVE) ×2 IMPLANT
GOWN STRL REUS W/ TWL LRG LVL3 (GOWN DISPOSABLE) ×1 IMPLANT
GOWN STRL REUS W/ TWL XL LVL3 (GOWN DISPOSABLE) ×1 IMPLANT
GOWN STRL REUS W/TWL LRG LVL3 (GOWN DISPOSABLE) ×2
GOWN STRL REUS W/TWL XL LVL3 (GOWN DISPOSABLE) ×2
GUIDEWIRE STR DUAL SENSOR (WIRE) ×2 IMPLANT
INFUSOR MANOMETER BAG 3000ML (MISCELLANEOUS) ×2 IMPLANT
IV NS IRRIG 3000ML ARTHROMATIC (IV SOLUTION) ×2 IMPLANT
KIT TURNOVER CYSTO (KITS) ×2 IMPLANT
PACK CYSTO AR (MISCELLANEOUS) ×2 IMPLANT
SET CYSTO W/LG BORE CLAMP LF (SET/KITS/TRAYS/PACK) ×2 IMPLANT
SHEATH URETERAL 12FRX35CM (MISCELLANEOUS) IMPLANT
STENT URET 6FRX24 CONTOUR (STENTS) IMPLANT
STENT URET 6FRX26 CONTOUR (STENTS) ×2 IMPLANT
SURGILUBE 2OZ TUBE FLIPTOP (MISCELLANEOUS) ×2 IMPLANT
SYR 10ML LL (SYRINGE) ×2 IMPLANT
TRACTIP FLEXIVA PULSE ID 200 (Laser) IMPLANT
VALVE UROSEAL ADJ ENDO (VALVE) ×2 IMPLANT
WATER STERILE IRR 1000ML POUR (IV SOLUTION) ×2 IMPLANT

## 2021-02-24 NOTE — Anesthesia Postprocedure Evaluation (Signed)
Anesthesia Post Note  Patient: Shawn Sherman  Procedure(s) Performed: CYSTOSCOPY/URETEROSCOPY/HOLMIUM LASER/STENT EXCHANGE (Left)  Patient location during evaluation: PACU Anesthesia Type: General Level of consciousness: awake and alert, awake and oriented Pain management: pain level controlled Vital Signs Assessment: post-procedure vital signs reviewed and stable Respiratory status: spontaneous breathing, nonlabored ventilation and respiratory function stable Cardiovascular status: blood pressure returned to baseline and stable Postop Assessment: no apparent nausea or vomiting Anesthetic complications: no   No notable events documented.   Last Vitals:  Vitals:   02/24/21 1319 02/24/21 1329  BP: 108/78 103/82  Pulse: 63 68  Resp: 15 20  Temp: (!) 36.4 C (!) 36.1 C  SpO2: 99% 100%    Last Pain:  Vitals:   02/24/21 1329  TempSrc: Temporal  PainSc: 0-No pain                 Manfred Arch

## 2021-02-24 NOTE — Interval H&P Note (Signed)
UROLOGY H&P UPDATE  Agree with prior H&P dated 01/26/2021.  65 year old male who presented with sepsis from urinary source and 1 cm left UPJ stone and underwent urgent stent placement.  Here today for definitive management  Cardiac: RRR Lungs: CTA bilaterally  Laterality: Left Procedure: Left ureteroscopy, laser lithotripsy, stent placement  Urine: Culture with Enterococcus, has been on culture appropriate Augmentin  We specifically discussed the risks ureteroscopy including bleeding, infection/sepsis, stent related symptoms including flank pain/urgency/frequency/incontinence/dysuria, ureteral injury, inability to access stone, or need for staged or additional procedures.   Sondra Come, MD 02/24/2021

## 2021-02-24 NOTE — Transfer of Care (Signed)
Immediate Anesthesia Transfer of Care Note  Patient: Shawn Sherman  Procedure(s) Performed: CYSTOSCOPY/URETEROSCOPY/HOLMIUM LASER/STENT EXCHANGE (Left)  Patient Location: PACU  Anesthesia Type:General  Level of Consciousness: awake, alert  and oriented  Airway & Oxygen Therapy: Patient Spontanous Breathing and Patient connected to face mask oxygen  Post-op Assessment: Report given to RN and Post -op Vital signs reviewed and stable  Post vital signs: Reviewed and stable  Last Vitals:  Vitals Value Taken Time  BP 128/80 02/24/21 1248  Temp 36.2 C 02/24/21 1248  Pulse 67 02/24/21 1255  Resp 3 02/24/21 1255  SpO2 100 % 02/24/21 1255  Vitals shown include unvalidated device data.  Last Pain:  Vitals:   02/24/21 1248  TempSrc:   PainSc: 0-No pain         Complications: No notable events documented.

## 2021-02-24 NOTE — Anesthesia Preprocedure Evaluation (Signed)
Anesthesia Evaluation  Patient identified by MRN, date of birth, ID band Patient awake    Reviewed: Allergy & Precautions, NPO status , Patient's Chart, lab work & pertinent test results  History of Anesthesia Complications Negative for: history of anesthetic complications  Airway Mallampati: III  TM Distance: >3 FB Neck ROM: full    Dental  (+) Chipped,    Pulmonary neg shortness of breath,    Pulmonary exam normal        Cardiovascular Exercise Tolerance: Good hypertension, Pt. on medications (-) angina+ CAD and + Peripheral Vascular Disease  Normal cardiovascular exam     Neuro/Psych  Headaches, negative psych ROS   GI/Hepatic Neg liver ROS, GERD  Medicated and Controlled,  Endo/Other  negative endocrine ROS  Renal/GU Renal disease     Musculoskeletal  (+) Arthritis ,   Abdominal   Peds  Hematology negative hematology ROS (+)   Anesthesia Other Findings  Anginal pain (HCC)     Comment:  a. 08/2018 - atyp c/p and neg trop; b. 08/2018 MV: EF 57%,              inferolateral horizontal ST depression w/o ischemia or               infarct. Arthritis Carotid arterial disease (HCC)     Comment:  a. 01/2019 Carotid U/S: RICA 80-90, RCCA <50, RECA>50,               LICA 80-99, LCCA>50, LECA>50; b. 01/2019 CT Head/Neck:               RICA string sign in bulb - lumen <40mm. Sev prox RECA dzs.              LICA 60 @ dist bulb. R vert 50-70, L vert 30-50. 08/2018: Carotid bruit present     Comment:  left Chewing tobacco nicotine dependence Coronary artery disease Essential hypertension GERD (gastroesophageal reflux disease) 08/04/2020: History of 2019 novel coronavirus disease (COVID-19) History of kidney stones Hyperlipidemia   Reproductive/Obstetrics negative OB ROS                             Anesthesia Physical  Anesthesia Plan  ASA: 3 and emergent  Anesthesia Plan: General    Post-op Pain Management:    Induction: Intravenous  PONV Risk Score and Plan: Ondansetron, Dexamethasone, Midazolam and Treatment may vary due to age or medical condition  Airway Management Planned: Oral ETT  Additional Equipment:   Intra-op Plan:   Post-operative Plan: Extubation in OR  Informed Consent: I have reviewed the patients History and Physical, chart, labs and discussed the procedure including the risks, benefits and alternatives for the proposed anesthesia with the patient or authorized representative who has indicated his/her understanding and acceptance.     Dental Advisory Given  Plan Discussed with: Anesthesiologist, CRNA and Surgeon  Anesthesia Plan Comments: (Patient consented for risks of anesthesia including but not limited to:  - adverse reactions to medications - damage to eyes, teeth, lips or other oral mucosa - nerve damage due to positioning  - sore throat or hoarseness - Damage to heart, brain, nerves, lungs, other parts of body or loss of life  Patient voiced understanding.)        Anesthesia Quick Evaluation

## 2021-02-24 NOTE — Discharge Instructions (Signed)
AMBULATORY SURGERY  ?DISCHARGE INSTRUCTIONS ? ? ?The drugs that you were given will stay in your system until tomorrow so for the next 24 hours you should not: ? ?Drive an automobile ?Make any legal decisions ?Drink any alcoholic beverage ? ? ?You may resume regular meals tomorrow.  Today it is better to start with liquids and gradually work up to solid foods. ? ?You may eat anything you prefer, but it is better to start with liquids, then soup and crackers, and gradually work up to solid foods. ? ? ?Please notify your doctor immediately if you have any unusual bleeding, trouble breathing, redness and pain at the surgery site, drainage, fever, or pain not relieved by medication. ? ? ? ?Additional Instructions: ? ? ? ?Please contact your physician with any problems or Same Day Surgery at [phone], Monday through Friday 6 am to 4 pm, or [place] [facility] [place] Main number at [phone].  ?

## 2021-02-24 NOTE — Op Note (Signed)
Date of procedure: 02/24/21  Preoperative diagnosis:  Left proximal ureteral stone  Postoperative diagnosis:  Same  Procedure: Cystoscopy, left ureteroscopy, laser lithotripsy, left retrograde pyelogram with intraoperative interpretation, left ureteral stent placement  Surgeon: Legrand Rams, MD  Anesthesia: General  Complications: None  Intraoperative findings:  Moderate size prostate, normal cystoscopy Uncomplicated dusting of 1 cm stone that had been pushed back into the lower pole Uncomplicated stent placement with Dangler  EBL: Minimal  Specimens: None  Drains: Left 6 French by 26 cm ureteral stent with Dangler  Indication: Shawn Sherman is a 65 y.o. patient who previously presented with a 1 cm left proximal ureteral stone and sepsis from urinary source and underwent emergent stent placement.  He presents today for definitive management.  After reviewing the management options for treatment, they elected to proceed with the above surgical procedure(s). We have discussed the potential benefits and risks of the procedure, side effects of the proposed treatment, the likelihood of the patient achieving the goals of the procedure, and any potential problems that might occur during the procedure or recuperation. Informed consent has been obtained.  Description of procedure:  The patient was taken to the operating room and general anesthesia was induced. SCDs were placed for DVT prophylaxis.. The patient was placed in the dorsal lithotomy position, prepped and draped in the usual sterile fashion, and preoperative antibiotics(Ancef and vancomycin) were administered. A preoperative time-out was performed.   A 21 French rigid cystoscope was used to intubate the urethra and thorough cystoscopy was performed.  The prostate was moderate in size.  Ureteral orifices orthotopic bilaterally no suspicious lesions, mild trabeculations.  A sensor was passed in the left ureteral orifice alongside  the stent and passed up to the kidney under fluoroscopic vision.  The stent was grasped and pulled to the meatus, and a second safety sensor wire was added.  The dual-channel digital ureteroscope advanced easily over the wire up to the kidney under fluoroscopic vision.  Thorough pyeloscopy revealed a yellow and black stone consistent with calcium oxalate in the lower pole.  A 200 m laser fiber was used to dust the stone to <33mm fragments.  Thorough pyeloscopy revealed no other stones.  A retrograde pyelogram was performed from the proximal ureter and showed no extravasation or filling defects.  Careful pullback ureteroscopy showed no ureteral injury or fragments.  The rigid cystoscope was backloaded over the wire and a 6 [place] by 26 cm ureteral stent with Dangler attached was uneventfully placed with an excellent curl in the renal pelvis, as well as in the bladder.  The bladder was drained, the Dangler was secured to the penis using Tegaderm and Mastisol, and a belladonna suppository was placed.   Disposition: Stable to PACU  Plan: Remove stent at home on Tuesday morning RTC in clinic 6 months for KUB  Legrand Rams, MD

## 2021-02-24 NOTE — Anesthesia Procedure Notes (Signed)
Procedure Name: Intubation Date/Time: 02/24/2021 12:00 PM Performed by: Lorine Bears, RN Pre-anesthesia Checklist: Patient identified, Patient being monitored, Timeout performed, Emergency Drugs available and Suction available Patient Re-evaluated:Patient Re-evaluated prior to induction Oxygen Delivery Method: Circle system utilized Preoxygenation: Pre-oxygenation with 100% oxygen Induction Type: IV induction Ventilation: Mask ventilation without difficulty and Oral airway inserted - appropriate to patient size Laryngoscope Size: McGraph and 4 Grade View: Grade I Tube type: Oral Tube size: 7.5 mm Number of attempts: 1 Airway Equipment and Method: Stylet Placement Confirmation: ETT inserted through vocal cords under direct vision, positive ETCO2 and breath sounds checked- equal and bilateral Secured at: 21 cm Tube secured with: Tape Dental Injury: Teeth and Oropharynx as per pre-operative assessment

## 2021-02-27 ENCOUNTER — Telehealth: Payer: Self-pay

## 2021-02-27 NOTE — Telephone Encounter (Signed)
Incoming call on triage line from patient in regards to a work note. Patient called to check when he was to pull his stent at home, advised patient Tuesday morning. Patient requesting work note for today, Monday 02/27/21. Please advise.

## 2021-02-28 ENCOUNTER — Encounter: Payer: Self-pay | Admitting: Urology

## 2021-02-28 NOTE — Telephone Encounter (Signed)
Pt came in office to pick up work note, excusing him to return to work tomorrow 03/01/21.

## 2021-02-28 NOTE — Telephone Encounter (Signed)
OK to give work note for Monday  Thanks Legrand Rams, MD 02/28/2021

## 2021-02-28 NOTE — Telephone Encounter (Signed)
Called pt he states that he would like to come by the office and pick note up.

## 2021-03-01 ENCOUNTER — Encounter: Payer: Self-pay | Admitting: Urology

## 2021-03-20 ENCOUNTER — Other Ambulatory Visit: Payer: Self-pay | Admitting: Family

## 2021-03-20 ENCOUNTER — Other Ambulatory Visit: Payer: Self-pay | Admitting: Family Medicine

## 2021-03-20 DIAGNOSIS — I10 Essential (primary) hypertension: Secondary | ICD-10-CM

## 2021-03-20 DIAGNOSIS — E782 Mixed hyperlipidemia: Secondary | ICD-10-CM

## 2021-03-20 NOTE — Telephone Encounter (Signed)
Requested Prescriptions  Pending Prescriptions Disp Refills  . atorvastatin (LIPITOR) 80 MG tablet [Pharmacy Med Name: ATORVASTATIN CALCIUM 80 MG TAB] 30 tablet 0    Sig: TAKE 1 TABLET BY MOUTH ONCE DAILY     Cardiovascular:  Antilipid - Statins Failed - 03/20/2021 11:57 AM      Failed - Total Cholesterol in normal range and within 360 days    Cholesterol, Total  Date Value Ref Range Status  03/23/2020 143 100 - 199 mg/dL Final         Failed - LDL in normal range and within 360 days    LDL Cholesterol (Calc)  Date Value Ref Range Status  04/19/2017 73 mg/dL (calc) Final    Comment:    Reference range: <100 . Desirable range <100 mg/dL for primary prevention;   <70 mg/dL for patients with CHD or diabetic patients  with > or = 2 CHD risk factors. Marland Kitchen LDL-C is now calculated using the Martin-Hopkins  calculation, which is a validated novel method providing  better accuracy than the Friedewald equation in the  estimation of LDL-C.  Horald Pollen et al. Lenox Ahr. 4132;440(10): 2061-2068  (http://education.[employer].com/faq/FAQ164)    LDL Chol Calc (NIH)  Date Value Ref Range Status  03/23/2020 70 0 - 99 mg/dL Final         Failed - HDL in normal range and within 360 days    HDL  Date Value Ref Range Status  03/23/2020 51 >39 mg/dL Final         Failed - Triglycerides in normal range and within 360 days    Triglycerides  Date Value Ref Range Status  03/23/2020 124 0 - 149 mg/dL Final         Passed - Patient is not pregnant      Passed - Valid encounter within last 12 months    Recent Outpatient Visits          1 month ago Urinary tract obstruction due to kidney stone   [facility] Chrismon, Jodell Cipro, PA-C   10 months ago Right hip pain   [facility] Chrismon, Jodell Cipro, PA-C   1 year ago Essential hypertension   [facility] [place], Marzella Schlein, MD   3 years ago Right flank pain   [facility] [place],  Marzella Schlein, MD   3 years ago Patellofemoral pain syndrome of right knee   [facility] An[place], [place]      Future Appointments            In 5 months Richardo Hanks, Laurette Schimke, MD [facility]           Patient needs an appointment for further refills.

## 2021-03-21 ENCOUNTER — Ambulatory Visit (INDEPENDENT_AMBULATORY_CARE_PROVIDER_SITE_OTHER): Payer: 59 | Admitting: Vascular Surgery

## 2021-03-21 ENCOUNTER — Encounter (INDEPENDENT_AMBULATORY_CARE_PROVIDER_SITE_OTHER): Payer: 59

## 2021-03-29 DIAGNOSIS — Z23 Encounter for immunization: Secondary | ICD-10-CM | POA: Diagnosis not present

## 2021-03-30 ENCOUNTER — Encounter: Payer: Self-pay | Admitting: Family Medicine

## 2021-04-03 ENCOUNTER — Other Ambulatory Visit (INDEPENDENT_AMBULATORY_CARE_PROVIDER_SITE_OTHER): Payer: Self-pay | Admitting: Vascular Surgery

## 2021-04-03 DIAGNOSIS — I6523 Occlusion and stenosis of bilateral carotid arteries: Secondary | ICD-10-CM

## 2021-04-05 ENCOUNTER — Ambulatory Visit (INDEPENDENT_AMBULATORY_CARE_PROVIDER_SITE_OTHER): Payer: Self-pay | Admitting: Nurse Practitioner

## 2021-04-05 ENCOUNTER — Other Ambulatory Visit: Payer: Self-pay

## 2021-04-05 ENCOUNTER — Ambulatory Visit (INDEPENDENT_AMBULATORY_CARE_PROVIDER_SITE_OTHER): Payer: Self-pay

## 2021-04-05 ENCOUNTER — Encounter (INDEPENDENT_AMBULATORY_CARE_PROVIDER_SITE_OTHER): Payer: Self-pay | Admitting: Nurse Practitioner

## 2021-04-05 VITALS — BP 122/71 | HR 94 | Ht 70.0 in | Wt 169.0 lb

## 2021-04-05 DIAGNOSIS — I6523 Occlusion and stenosis of bilateral carotid arteries: Secondary | ICD-10-CM

## 2021-04-05 DIAGNOSIS — E782 Mixed hyperlipidemia: Secondary | ICD-10-CM

## 2021-04-05 DIAGNOSIS — I10 Essential (primary) hypertension: Secondary | ICD-10-CM

## 2021-04-10 ENCOUNTER — Encounter (INDEPENDENT_AMBULATORY_CARE_PROVIDER_SITE_OTHER): Payer: Self-pay | Admitting: Nurse Practitioner

## 2021-04-10 NOTE — Progress Notes (Signed)
Subjective:    Patient ID: Shawn Sherman, male    DOB: 1956-06-30, 65 y.o.   MRN: [id number] Chief Complaint  Patient presents with   Follow-up    54yr Carotid    Shawn Sherman is a 65 year old male is seen for follow up evaluation of carotid stenosis. The carotid stenosis followed by ultrasound.  She had a right carotid endarterectomy in 02/18/2019 and had a left in 06/11/2019  The patient denies amaurosis fugax. There is no recent history of TIA symptoms or focal motor deficits. There is no prior documented CVA.  The patient is taking enteric-coated aspirin 81 mg daily.  There is no history of migraine headaches. There is no history of seizures.  The patient has a history of coronary artery disease, no recent episodes of angina or shortness of breath. The patient denies PAD or claudication symptoms. There is a history of hyperlipidemia which is being treated with a statin.    Carotid Duplex done today shows 1 to 39% stenosis bilaterally.  No change compared to last study in 03/22/2020   Review of Systems     Objective:   Physical Exam  BP 122/71   Pulse 94   Ht 5\' 10"  (1.778 m)   Wt 169 lb (76.7 kg)   BMI 24.25 kg/m   Past Medical History:  Diagnosis Date   Anginal pain (HCC) 08/2018   a. 08/2018 - atyp c/p and neg trop; b. 08/2018 MV: EF 57%, inferolateral horizontal ST depression w/o ischemia or infarct.   Arthritis    Carotid arterial disease (HCC)    a. 01/2019 Carotid U/S: RICA 80-90, RCCA <50, RECA>50, LICA 80-99, LCCA>50, LECA>50; b. 01/2019 CT Head/Neck: RICA string sign in bulb - lumen <38mm. Sev prox RECA dzs. LICA 60 @ dist bulb. R vert 50-70, L vert 30-50.   Carotid bruit present 08/2018   left   Chewing tobacco nicotine dependence    Coronary artery disease    Essential hypertension 03/29/2017   GERD (gastroesophageal reflux disease)    Headache    migrraines   History of 2019 novel coronavirus disease (COVID-19) 08/04/2020   History of kidney stones     Hyperlipidemia     Social History   Socioeconomic History   Marital status: Married    Spouse name: 08/06/2020   Number of children: Not on file   Years of education: Not on file   Highest education level: Not on file  Occupational History   Not on file  Tobacco Use   Smoking status: Never   Smokeless tobacco: Current    Types: Chew  Vaping Use   Vaping Use: Never used  Substance and Sexual Activity   Alcohol use: No   Drug use: No   Sexual activity: Yes    Birth control/protection: None  Other Topics Concern   Not on file  Social History Narrative   Not on file   Social Determinants of Health   Financial Resource Strain: Not on file  Food Insecurity: Not on file  Transportation Needs: Not on file  Physical Activity: Not on file  Stress: Not on file  Social Connections: Not on file  Intimate Partner Violence: Not on file    Past Surgical History:  Procedure Laterality Date   ANTERIOR LAT LUMBAR FUSION Right 01/01/2013   Procedure: lumbar three-four extreme lateral interbody fusion;  Surgeon: 01/03/2013, MD;  Location: [facility];  Service: Neurosurgery;  Laterality: Right;  Lumbar three-four extreme lateral interbody  fusion     BACK SURGERY  08   COLONOSCOPY     CYSTOSCOPY WITH STENT PLACEMENT Left 01/26/2021   Procedure: CYSTOSCOPY WITH STENT PLACEMENT;  Surgeon: Sondra Come, MD;  Location: [facility];  Service: Urology;  Laterality: Left;   CYSTOSCOPY/URETEROSCOPY/HOLMIUM LASER/STENT PLACEMENT Left 02/24/2021   Procedure: CYSTOSCOPY/URETEROSCOPY/HOLMIUM LASER/STENT EXCHANGE;  Surgeon: Sondra Come, MD;  Location: [facility];  Service: Urology;  Laterality: Left;   ENDARTERECTOMY Right 02/18/2019   Procedure: ENDARTERECTOMY CAROTID;  Surgeon: Annice Needy, MD;  Location: [facility];  Service: Vascular;  Laterality: Right;   ENDARTERECTOMY Left 06/11/2019   Procedure: ENDARTERECTOMY CAROTID;  Surgeon: Annice Needy, MD;  Location: [facility];  Service: Vascular;   Laterality: Left;   ESOPHAGOGASTRODUODENOSCOPY (EGD) WITH PROPOFOL N/A 07/05/2015   Procedure: ESOPHAGOGASTRODUODENOSCOPY (EGD) WITH PROPOFOL;  Surgeon: Midge Minium, MD;  Location: [facility];  Service: Endoscopy;  Laterality: N/A;   EXTRACORPOREAL SHOCK WAVE LITHOTRIPSY Right 09/11/2018   Procedure: EXTRACORPOREAL SHOCK WAVE LITHOTRIPSY (ESWL);  Surgeon: Riki Altes, MD;  Location: [facility];  Service: Urology;  Laterality: Right;   LUMBAR PERCUTANEOUS PEDICLE SCREW 1 LEVEL N/A 01/01/2013   Procedure: lumbar three-four percutaneous pedicle screws ;  Surgeon: Reinaldo Meeker, MD;  Location: [facility];  Service: Neurosurgery;  Laterality: N/A;  lumbar three-four percutaneous pedicle screws   SHOULDER ARTHROSCOPY WITH ROTATOR CUFF REPAIR Left 08/22/2020   Procedure: SHOULDER ARTHROSCOPY WITH ROTATOR CUFF REPAIR AND DEBRIDEMENT;  Surgeon: Lyndle Herrlich, MD;  Location: [facility];  Service: Orthopedics;  Laterality: Left;    Family History  Problem Relation Age of Onset   Cancer Mother    Cancer Father    COPD Sister     No Known Allergies  CBC Latest Ref Rng & Units 02/09/2021 01/27/2021 01/26/2021  WBC 3.4 - 10.8 x10E3/uL 7.9 10.1 13.5(H)  Hemoglobin 13.0 - 17.7 g/dL 67.3 12.7(L) 14.2  Hematocrit 37.5 - 51.0 % 37.6 37.7(L) 42.3  Platelets 150 - 450 x10E3/uL 313 149(L) 153      CMP     Component Value Date/Time   NA 138 02/09/2021 1305   K 4.9 02/09/2021 1305   CL 100 02/09/2021 1305   CO2 25 02/09/2021 1305   GLUCOSE 180 (H) 02/09/2021 1305   GLUCOSE 288 (H) 01/27/2021 0450   BUN 18 02/09/2021 1305   CREATININE 0.98 02/09/2021 1305   CALCIUM 9.3 02/09/2021 1305   PROT 6.9 01/26/2021 1255   PROT 5.9 (L) 12/14/2019 1214   ALBUMIN 3.7 01/26/2021 1255   ALBUMIN 4.1 12/14/2019 1214   AST 21 01/26/2021 1255   ALT 20 01/26/2021 1255   ALKPHOS 113 01/26/2021 1255   BILITOT 1.9 (H) 01/26/2021 1255   BILITOT 0.5 12/14/2019 1214   GFRNONAA >60 01/27/2021 0450   GFRAA 113  04/22/2020 1154     No results found.     Assessment & Plan:   1. Bilateral carotid artery stenosis Recommend:  Given the patient's asymptomatic subcritical stenosis no further invasive testing or surgery at this time.  Duplex ultrasound shows 1-39% stenosis bilaterally.  Lateral endarterectomies are widely patent  Continue antiplatelet therapy as prescribed Continue management of CAD, HTN and Hyperlipidemia Healthy heart diet,  encouraged exercise at least 4 times per week Follow up in 12 months with duplex ultrasound and physical exam    2. Essential hypertension Continue antihypertensive medications as already ordered, these medications have been reviewed and there are no changes at this time.   3. Mixed  hyperlipidemia Continue statin as ordered and reviewed, no changes at this time    Current Outpatient Medications on File Prior to Visit  Medication Sig Dispense Refill   amLODipine (NORVASC) 10 MG tablet TAKE 1 TABLET BY MOUTH ONCE DAILY (Patient taking differently: Take 10 mg by mouth every morning.) 90 tablet 0   aspirin 81 MG chewable tablet Chew 81 mg by mouth daily.     atorvastatin (LIPITOR) 80 MG tablet TAKE 1 TABLET BY MOUTH ONCE DAILY 30 tablet 0   clopidogrel (PLAVIX) 75 MG tablet TAKE 1 TABLET BY MOUTH ONCE DAILY WITH BREAKFAST 90 tablet 3   Cyanocobalamin 1500 MCG TBDP Take 3,000 mcg by mouth daily.     diclofenac Sodium (VOLTAREN) 1 % GEL Apply 1 application topically 4 (four) times daily as needed (pain).     esomeprazole (NEXIUM) 40 MG capsule TAKE 1 CAPSULE BY MOUTH ONCE EVERY EVENING 90 capsule 0   ezetimibe (ZETIA) 10 MG tablet TAKE 1 TABLET BY MOUTH ONCE DAILY (Patient taking differently: Take 10 mg by mouth every morning. TAKE 1 TABLET BY MOUTH ONCE DAILY) 90 tablet 3   gabapentin (NEURONTIN) 100 MG capsule TAKE 1 CAPSULE BY MOUTH 3 TIMES DAILY 90 capsule 1   losartan (COZAAR) 25 MG tablet Take 12.5 mg by mouth daily.     Menthol, Topical Analgesic,  (ICY HOT EX) Apply 1 application topically daily as needed (pain).     Multiple Vitamins-Minerals (AIRBORNE PO) Take 1 tablet by mouth daily.     OVER THE COUNTER MEDICATION Take 1 tablet by mouth daily. Tamaflex otc supplement     oxybutynin (DITROPAN-XL) 10 MG 24 hr tablet Take 1 tablet (10 mg total) by mouth daily. 20 tablet 0   potassium chloride (KLOR-CON) 10 MEQ tablet Take 10 mEq by mouth 2 (two) times daily.     tamsulosin (FLOMAX) 0.4 MG CAPS capsule Take 1 capsule (0.4 mg total) by mouth daily after supper. 30 capsule 0   traMADol (ULTRAM) 50 MG tablet Take 50 mg by mouth every 6 (six) hours as needed for moderate pain.     No current facility-administered medications on file prior to visit.    There are no Patient Instructions on file for this visit. No follow-ups on file.   Georgiana Spinner, NP

## 2021-04-14 ENCOUNTER — Telehealth: Payer: Self-pay | Admitting: Family Medicine

## 2021-04-14 DIAGNOSIS — E782 Mixed hyperlipidemia: Secondary | ICD-10-CM

## 2021-04-14 MED ORDER — ATORVASTATIN CALCIUM 80 MG PO TABS: 80.0000 mg | ORAL_TABLET | Freq: Every day | ORAL | 0 refills | Status: DC

## 2021-04-14 NOTE — Telephone Encounter (Signed)
[facility] faxed refill request for the following medications:    atorvastatin (LIPITOR) 80 MG tablet  Please advise.

## 2021-04-20 ENCOUNTER — Other Ambulatory Visit: Payer: Self-pay | Admitting: Family

## 2021-04-20 ENCOUNTER — Other Ambulatory Visit: Payer: Self-pay | Admitting: Family Medicine

## 2021-04-20 DIAGNOSIS — M7061 Trochanteric bursitis, right hip: Secondary | ICD-10-CM

## 2021-04-20 DIAGNOSIS — I10 Essential (primary) hypertension: Secondary | ICD-10-CM

## 2021-04-20 DIAGNOSIS — K209 Esophagitis, unspecified without bleeding: Secondary | ICD-10-CM

## 2021-04-20 NOTE — Telephone Encounter (Signed)
Rx(s) sent to pharmacy electronically.  

## 2021-04-21 MED ORDER — LOSARTAN POTASSIUM 25 MG PO TABS: 12.5000 mg | ORAL_TABLET | Freq: Every day | ORAL | 3 refills | Status: DC

## 2021-04-21 NOTE — Telephone Encounter (Signed)
Requested medication (s) are due for refill today:Yes  Requested medication (s) are on the active medication list: Yes  Last refill:  02/17/21  Future visit scheduled:No  Notes to clinic:  Unable to refill per protocol, last refill by another provider.      Requested Prescriptions  Pending Prescriptions Disp Refills   losartan (COZAAR) 25 MG tablet      Sig: Take 0.5 tablets (12.5 mg total) by mouth daily.     Cardiovascular:  Angiotensin Receptor Blockers Passed - 04/21/2021 12:57 PM      Passed - Cr in normal range and within 180 days    Creatinine, Ser  Date Value Ref Range Status  02/09/2021 0.98 0.76 - 1.27 mg/dL Final          Passed - K in normal range and within 180 days    Potassium  Date Value Ref Range Status  02/09/2021 4.9 3.5 - 5.2 mmol/L Final          Passed - Patient is not pregnant      Passed - Last BP in normal range    BP Readings from Last 1 Encounters:  04/05/21 122/71          Passed - Valid encounter within last 6 months    Recent Outpatient Visits           2 months ago Urinary tract obstruction due to kidney stone   [facility] Chrismon, Jodell Cipro, PA-C   11 months ago Right hip pain   [facility], Jodell Cipro, PA-C   1 year ago Essential hypertension   [facility] [place], Marzella Schlein, MD   3 years ago Right flank pain   [facility] [place], Marzella Schlein, MD   3 years ago Patellofemoral pain syndrome of right knee   [facility] [place], [place], [place]       Future Appointments             In 4 months Richardo Hanks, Laurette Schimke, MD [facility]            Signed Prescriptions Disp Refills   gabapentin (NEURONTIN) 100 MG capsule 90 capsule 1    Sig: Take 1 capsule (100 mg total) by mouth 3 (three) times daily. OFFICE VISIT NEEDED FOR ADDITIONAL REFILLS     Neurology: Anticonvulsants - gabapentin Passed - 04/21/2021 12:57 PM       Passed - Valid encounter within last 12 months    Recent Outpatient Visits           2 months ago Urinary tract obstruction due to kidney stone   [facility] Chrismon, Jodell Cipro, PA-C   11 months ago Right hip pain   [facility] Chrismon, Jodell Cipro, PA-C   1 year ago Essential hypertension   [facility] [place], Marzella Schlein, MD   3 years ago Right flank pain   [facility] [place], Marzella Schlein, MD   3 years ago Patellofemoral pain syndrome of right knee   [facility] [place], Molly [place], [place]       Future Appointments             In 4 months Richardo Hanks, Laurette Schimke, MD [facility]             esomeprazole (NEXIUM) 40 MG capsule 90 capsule 0    Sig: Take 1 capsule (40 mg total) by mouth every evening. OFFICE VISIT NEEDED FOR ADDITIONAL REFILLS  Gastroenterology: Proton Pump Inhibitors Passed - 04/21/2021 12:57 PM      Passed - Valid encounter within last 12 months    Recent Outpatient Visits           2 months ago Urinary tract obstruction due to kidney stone   [facility] [place] Chrismon, Jodell Cipro, PA-C   11 months ago Right hip pain   [facility] Chrismon, Jodell Cipro, PA-C   1 year ago Essential hypertension   [facility] [place], Marzella Schlein, MD   3 years ago Right flank pain   [facility] [place], Marzella Schlein, MD   3 years ago Patellofemoral pain syndrome of right knee   [facility] [place], [place]       Future Appointments             In 4 months Richardo Hanks, Laurette Schimke, MD [facility]

## 2021-07-22 ENCOUNTER — Other Ambulatory Visit: Payer: Self-pay | Admitting: Family Medicine

## 2021-07-22 DIAGNOSIS — K209 Esophagitis, unspecified without bleeding: Secondary | ICD-10-CM

## 2021-07-22 DIAGNOSIS — E782 Mixed hyperlipidemia: Secondary | ICD-10-CM

## 2021-07-22 DIAGNOSIS — M7061 Trochanteric bursitis, right hip: Secondary | ICD-10-CM

## 2021-07-22 NOTE — Telephone Encounter (Signed)
Requested medication (s) are due for refill today: yes  Requested medication (s) are on the active medication list: yes  Last refill:  04/14/21 #90  Future visit scheduled: no  Notes to clinic:  overdue lab works and appt-  called pt and LM on VM to call Monday to make appt.   Requested Prescriptions  Pending Prescriptions Disp Refills   atorvastatin (LIPITOR) 80 MG tablet [Pharmacy Med Name: ATORVASTATIN CALCIUM 80 MG TAB] 30 tablet     Sig: TAKE 1 TABLET BY MOUTH ONCE DAILY     Cardiovascular:  Antilipid - Statins Failed - 07/22/2021 12:51 PM      Failed - Total Cholesterol in normal range and within 360 days    Cholesterol, Total  Date Value Ref Range Status  03/23/2020 143 100 - 199 mg/dL Final          Failed - LDL in normal range and within 360 days    LDL Cholesterol (Calc)  Date Value Ref Range Status  04/19/2017 73 mg/dL (calc) Final    Comment:    Reference range: <100 . Desirable range <100 mg/dL for primary prevention;   <70 mg/dL for patients with CHD or diabetic patients  with > or = 2 CHD risk factors. Marland Kitchen LDL-C is now calculated using the Martin-Hopkins  calculation, which is a validated novel method providing  better accuracy than the Friedewald equation in the  estimation of LDL-C.  Horald Pollen et al. Lenox Ahr. 8527;782(42): 2061-2068  (http://education.[facility].com/faq/FAQ164)    LDL Chol Calc (NIH)  Date Value Ref Range Status  03/23/2020 70 0 - 99 mg/dL Final          Failed - HDL in normal range and within 360 days    HDL  Date Value Ref Range Status  03/23/2020 51 >39 mg/dL Final          Failed - Triglycerides in normal range and within 360 days    Triglycerides  Date Value Ref Range Status  03/23/2020 124 0 - 149 mg/dL Final          Passed - Patient is not pregnant      Passed - Valid encounter within last 12 months    Recent Outpatient Visits           5 months ago Urinary tract obstruction due to kidney stone    [facility], Jodell Cipro, PA-C   1 year ago Right hip pain   [facility] Chrismon, Jodell Cipro, PA-C   1 year ago Essential hypertension   [facility] [place], Marzella Schlein, MD   3 years ago Right flank pain   [facility] [place], Marzella Schlein, MD   3 years ago Patellofemoral pain syndrome of right knee   [facility] [place], Molly Maduro, [place]       Future Appointments             In 1 month Richardo Hanks, Laurette Schimke, MD [facility]            Signed Prescriptions Disp Refills   esomeprazole (NEXIUM) 40 MG capsule 90 capsule 0    Sig: TAKE 1 CAPSULE BY MOUTH ONCE EVERY EVENING     Gastroenterology: Proton Pump Inhibitors Passed - 07/22/2021 12:34 PM      Passed - Valid encounter within last 12 months    Recent Outpatient Visits           5 months ago Urinary tract obstruction due to kidney  stone   [facility] Chrismon, Jodell Cipro, PA-C   1 year ago Right hip pain   [facility] Chrismon, Jodell Cipro, PA-C   1 year ago Essential hypertension   [facility] [place], Marzella Schlein, MD   3 years ago Right flank pain   [facility] [place], Marzella Schlein, MD   3 years ago Patellofemoral pain syndrome of right knee   [facility] [place], Molly Maduro, [place]       Future Appointments             In 1 month Richardo Hanks, Laurette Schimke, MD [facility]            Refused Prescriptions Disp Refills   gabapentin (NEURONTIN) 100 MG capsule [Pharmacy Med Name: GABAPENTIN 100 MG CAP] 90 capsule     Sig: TAKE 1 CAPSULE BY MOUTH 3 TIMES DAILY     Neurology: Anticonvulsants - gabapentin Passed - 07/22/2021 12:51 PM      Passed - Valid encounter within last 12 months    Recent Outpatient Visits           5 months ago Urinary tract obstruction due to kidney stone   [facility] Chrismon, Jodell Cipro, PA-C   1 year ago  Right hip pain   [facility] Chrismon, Jodell Cipro, PA-C   1 year ago Essential hypertension   [facility] [place], Marzella Schlein, MD   3 years ago Right flank pain   [facility] [place], Marzella Schlein, MD   3 years ago Patellofemoral pain syndrome of right knee   [facility] [place], Molly Maduro, [place]       Future Appointments             In 1 month Richardo Hanks, Laurette Schimke, MD [facility]

## 2021-07-22 NOTE — Telephone Encounter (Signed)
Requested Prescriptions  Pending Prescriptions Disp Refills   esomeprazole (NEXIUM) 40 MG capsule [Pharmacy Med Name: ESOMEPRAZOLE MAGNESIUM 40 MG CAP] 90 capsule 0    Sig: TAKE 1 CAPSULE BY MOUTH ONCE EVERY EVENING     Gastroenterology: Proton Pump Inhibitors Passed - 07/22/2021 12:34 PM      Passed - Valid encounter within last 12 months    Recent Outpatient Visits          5 months ago Urinary tract obstruction due to kidney stone   [facility] Chrismon, Jodell Cipro, PA-C   1 year ago Right hip pain   [facility], Jodell Cipro, PA-C   1 year ago Essential hypertension   [facility], Marzella Schlein, MD   3 years ago Right flank pain   [facility] [place], Marzella Schlein, MD   3 years ago Patellofemoral pain syndrome of right knee   [facility] [place], Molly Maduro, [place]      Future Appointments            In 1 month [place], Laurette Schimke, MD [facility]            atorvastatin (LIPITOR) 80 MG tablet [Pharmacy Med Name: ATORVASTATIN CALCIUM 80 MG TAB] 30 tablet     Sig: TAKE 1 TABLET BY MOUTH ONCE DAILY     Cardiovascular:  Antilipid - Statins Failed - 07/22/2021 12:34 PM      Failed - Total Cholesterol in normal range and within 360 days    Cholesterol, Total  Date Value Ref Range Status  03/23/2020 143 100 - 199 mg/dL Final         Failed - LDL in normal range and within 360 days    LDL Cholesterol (Calc)  Date Value Ref Range Status  04/19/2017 73 mg/dL (calc) Final    Comment:    Reference range: <100 . Desirable range <100 mg/dL for primary prevention;   <70 mg/dL for patients with CHD or diabetic patients  with > or = 2 CHD risk factors. Marland Kitchen LDL-C is now calculated using the Martin-Hopkins  calculation, which is a validated novel method providing  better accuracy than the Friedewald equation in the  estimation of LDL-C.  Horald Pollen et al. Lenox Ahr. 7530;051(10): 2061-2068   (http://education.[facility].com/faq/FAQ164)    LDL Chol Calc (NIH)  Date Value Ref Range Status  03/23/2020 70 0 - 99 mg/dL Final         Failed - HDL in normal range and within 360 days    HDL  Date Value Ref Range Status  03/23/2020 51 >39 mg/dL Final         Failed - Triglycerides in normal range and within 360 days    Triglycerides  Date Value Ref Range Status  03/23/2020 124 0 - 149 mg/dL Final         Passed - Patient is not pregnant      Passed - Valid encounter within last 12 months    Recent Outpatient Visits          5 months ago Urinary tract obstruction due to kidney stone   [facility] Chrismon, Jodell Cipro, PA-C   1 year ago Right hip pain   [facility], Jodell Cipro, PA-C   1 year ago Essential hypertension   [facility] [place], Marzella Schlein, MD   3 years ago Right flank pain   [facility] [place], Marzella Schlein, MD  3 years ago Patellofemoral pain syndrome of right knee   [facility] [place], Molly Maduro, [place]      Future Appointments            In 1 month Richardo Hanks, Laurette Schimke, MD [facility]            gabapentin (NEURONTIN) 100 MG capsule [Pharmacy Med Name: GABAPENTIN 100 MG CAP] 90 capsule     Sig: TAKE 1 CAPSULE BY MOUTH 3 TIMES DAILY     Neurology: Anticonvulsants - gabapentin Passed - 07/22/2021 12:34 PM      Passed - Valid encounter within last 12 months    Recent Outpatient Visits          5 months ago Urinary tract obstruction due to kidney stone   [facility] Chr[facility], Jodell Cipro, PA-C   1 year ago Right hip pain   [facility] Chr[facility], Jodell Cipro, PA-C   1 year ago Essential hypertension   [facility] [place], Marzella Schlein, MD   3 years ago Right flank pain   [facility] [place], Marzella Schlein, MD   3 years ago Patellofemoral pain syndrome of right knee   [facility] Molly Maduro, [place]      Future Appointments            In 1 month Richardo Hanks, Laurette Schimke, MD [facility]

## 2021-07-22 NOTE — Telephone Encounter (Signed)
Requested Prescriptions  Pending Prescriptions Disp Refills   gabapentin (NEURONTIN) 100 MG capsule [Pharmacy Med Name: GABAPENTIN 100 MG CAP] 180 capsule 0    Sig: TAKE 1 CAPSULE BY MOUTH 3 TIMES DAILY     Neurology: Anticonvulsants - gabapentin Passed - 07/22/2021  1:09 PM      Passed - Valid encounter within last 12 months    Recent Outpatient Visits          5 months ago Urinary tract obstruction due to kidney stone   [facility] Chrismon, Jodell Cipro, PA-C   1 year ago Right hip pain   [facility], Jodell Cipro, PA-C   1 year ago Essential hypertension   [facility] [place], Marzella Schlein, MD   3 years ago Right flank pain   [facility] [place], Marzella Schlein, MD   3 years ago Patellofemoral pain syndrome of right knee   [facility] [place], Molly Maduro, [place]      Future Appointments            In 1 month Richardo Hanks, Laurette Schimke, MD [facility]

## 2021-08-21 ENCOUNTER — Other Ambulatory Visit: Payer: Self-pay | Admitting: Family Medicine

## 2021-08-21 DIAGNOSIS — M7061 Trochanteric bursitis, right hip: Secondary | ICD-10-CM

## 2021-08-21 DIAGNOSIS — E782 Mixed hyperlipidemia: Secondary | ICD-10-CM

## 2021-08-22 NOTE — Telephone Encounter (Signed)
Requested Prescriptions  Pending Prescriptions Disp Refills   gabapentin (NEURONTIN) 100 MG capsule [Pharmacy Med Name: GABAPENTIN 100 MG CAP] 270 capsule 2    Sig: TAKE 1 CAPSULE BY MOUTH 3 TIMES DAILY     Neurology: Anticonvulsants - gabapentin Passed - 08/21/2021  3:05 PM      Passed - Cr in normal range and within 360 days    Creatinine, Ser  Date Value Ref Range Status  02/09/2021 0.98 0.76 - 1.27 mg/dL Final         Passed - Completed PHQ-2 or PHQ-9 in the last 360 days      Passed - Valid encounter within last 12 months    Recent Outpatient Visits          6 months ago Urinary tract obstruction due to kidney stone   [facility], Jodell Cipro, PA-C   1 year ago Right hip pain   [facility], Jodell Cipro, PA-C   1 year ago Essential hypertension   [facility] [place], Marzella Schlein, MD   3 years ago Right flank pain   [facility] [place], Marzella Schlein, MD   3 years ago Patellofemoral pain syndrome of right knee   [facility] [place], Molly Maduro, [place]      Future Appointments            In 1 week Richardo Hanks, Laurette Schimke, MD [facility]

## 2021-08-22 NOTE — Telephone Encounter (Signed)
Requested medication (s) are due for refill today - yes  Requested medication (s) are on the active medication list -yes  Future visit scheduled -no  Last refill: 04/14/21 #90  Notes to clinic: Request RF: fails lab protocol, last RF has notes  Requested Prescriptions  Pending Prescriptions Disp Refills   atorvastatin (LIPITOR) 80 MG tablet [Pharmacy Med Name: ATORVASTATIN CALCIUM 80 MG TAB] 90 tablet 0    Sig: TAKE 1 TABLET BY MOUTH ONCE EVERY EVENING. *MAKE APPOINTMENT FOR FURTHER FILLS*     Cardiovascular:  Antilipid - Statins Failed - 08/21/2021  3:04 PM      Failed - Lipid Panel in normal range within the last 12 months    Cholesterol, Total  Date Value Ref Range Status  03/23/2020 143 100 - 199 mg/dL Final   LDL Cholesterol (Calc)  Date Value Ref Range Status  04/19/2017 73 mg/dL (calc) Final    Comment:    Reference range: <100 . Desirable range <100 mg/dL for primary prevention;   <70 mg/dL for patients with CHD or diabetic patients  with > or = 2 CHD risk factors. Marland Kitchen LDL-C is now calculated using the Martin-Hopkins  calculation, which is a validated novel method providing  better accuracy than the Friedewald equation in the  estimation of LDL-C.  Horald Pollen et al. Lenox Ahr. 4967;591(63): 2061-2068  (http://education.[facility].com/faq/FAQ164)    LDL Chol Calc (NIH)  Date Value Ref Range Status  03/23/2020 70 0 - 99 mg/dL Final   HDL  Date Value Ref Range Status  03/23/2020 51 >39 mg/dL Final   Triglycerides  Date Value Ref Range Status  03/23/2020 124 0 - 149 mg/dL Final         Passed - Patient is not pregnant      Passed - Valid encounter within last 12 months    Recent Outpatient Visits           6 months ago Urinary tract obstruction due to kidney stone   [facility] Chrismon, Jodell Cipro, PA-C   1 year ago Right hip pain   [facility] Chr[facility], Jodell Cipro, PA-C   1 year ago Essential hypertension   [facility] [place], Marzella Schlein, MD   3 years ago Right flank pain   [facility] [place], Marzella Schlein, MD   3 years ago Patellofemoral pain syndrome of right knee   [facility] [place], Molly Maduro, [place]       Future Appointments             In 1 week Richardo Hanks, Laurette Schimke, MD [facility]               Requested Prescriptions  Pending Prescriptions Disp Refills   atorvastatin (LIPITOR) 80 MG tablet [Pharmacy Med Name: ATORVASTATIN CALCIUM 80 MG TAB] 90 tablet 0    Sig: TAKE 1 TABLET BY MOUTH ONCE EVERY EVENING. *MAKE APPOINTMENT FOR FURTHER FILLS*     Cardiovascular:  Antilipid - Statins Failed - 08/21/2021  3:04 PM      Failed - Lipid Panel in normal range within the last 12 months    Cholesterol, Total  Date Value Ref Range Status  03/23/2020 143 100 - 199 mg/dL Final   LDL Cholesterol (Calc)  Date Value Ref Range Status  04/19/2017 73 mg/dL (calc) Final    Comment:    Reference range: <100 . Desirable range <100 mg/dL for primary prevention;   <70 mg/dL for patients with CHD or diabetic  patients  with > or = 2 CHD risk factors. Marland Kitchen LDL-C is now calculated using the Martin-Hopkins  calculation, which is a validated novel method providing  better accuracy than the Friedewald equation in the  estimation of LDL-C.  Horald Pollen et al. Lenox Ahr. 7353;299(24): 2061-2068  (http://education.[facility].com/faq/FAQ164)    LDL Chol Calc (NIH)  Date Value Ref Range Status  03/23/2020 70 0 - 99 mg/dL Final   HDL  Date Value Ref Range Status  03/23/2020 51 >39 mg/dL Final   Triglycerides  Date Value Ref Range Status  03/23/2020 124 0 - 149 mg/dL Final         Passed - Patient is not pregnant      Passed - Valid encounter within last 12 months    Recent Outpatient Visits           6 months ago Urinary tract obstruction due to kidney stone   [facility], Jodell Cipro, PA-C   1 year ago Right hip pain    [facility], Jodell Cipro, PA-C   1 year ago Essential hypertension   [facility] [place], Marzella Schlein, MD   3 years ago Right flank pain   [facility] [place], Marzella Schlein, MD   3 years ago Patellofemoral pain syndrome of right knee   [facility] [place], Molly Maduro, [place]       Future Appointments             In 1 week Richardo Hanks, Laurette Schimke, MD [facility]

## 2021-08-31 ENCOUNTER — Encounter: Payer: Self-pay | Admitting: Urology

## 2021-08-31 ENCOUNTER — Ambulatory Visit: Payer: Medicare Other | Admitting: Urology

## 2021-09-05 ENCOUNTER — Other Ambulatory Visit: Payer: Self-pay

## 2021-09-05 DIAGNOSIS — N201 Calculus of ureter: Secondary | ICD-10-CM

## 2021-09-19 ENCOUNTER — Other Ambulatory Visit: Payer: Self-pay | Admitting: Family

## 2021-09-19 DIAGNOSIS — I10 Essential (primary) hypertension: Secondary | ICD-10-CM

## 2021-09-19 NOTE — Telephone Encounter (Signed)
Rx(s) sent to pharmacy electronically.  

## 2021-09-25 IMAGING — CT CT RENAL STONE PROTOCOL
2 of 4 series · 15 of 46 positions shown, 17 images · non-contrast
Comparison: CT abdomen and pelvis 02/13/2018.

CLINICAL DATA: Bilateral flank pain. History of urinary tract
stones.

EXAM:
CT ABDOMEN AND PELVIS WITHOUT CONTRAST
TECHNIQUE: Multidetector CT imaging of the abdomen and pelvis was performed
following the standard protocol without IV contrast.

[Series 2: stone full standard · axial · 0.72mm/px · z∈[-1191,-726]mm · 12 of 103 slices shown, 14 images]
[im 5/103  soft-tissue]
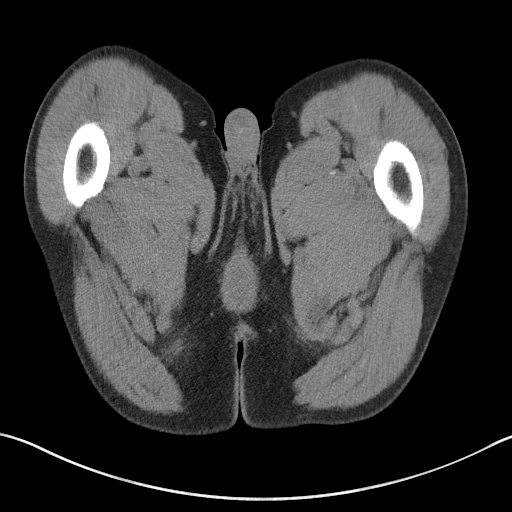
[im 5/103  bone]
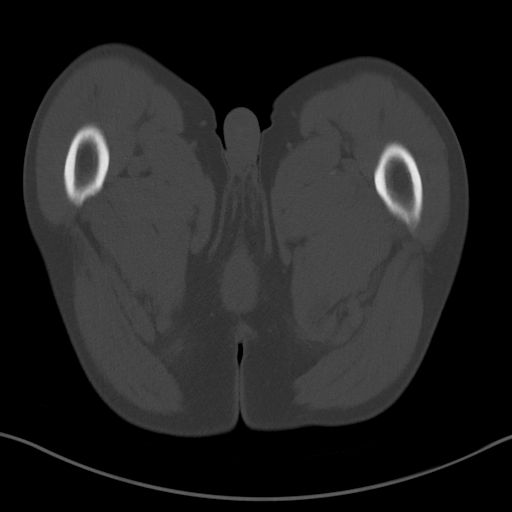
[im 14/103  soft-tissue]
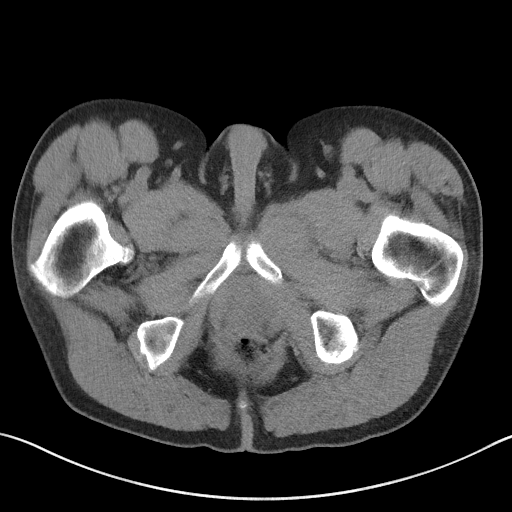
[im 23/103  soft-tissue]
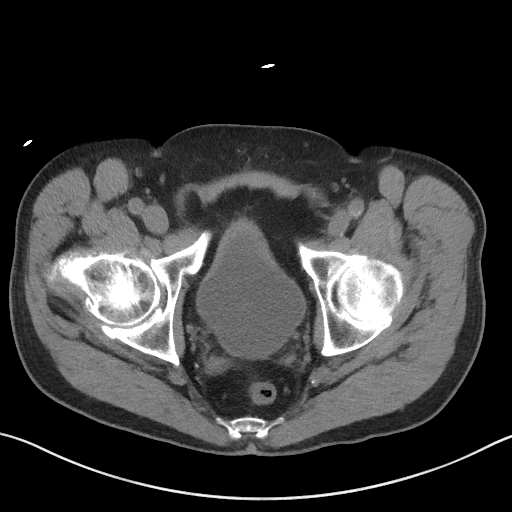
[im 32/103  soft-tissue]
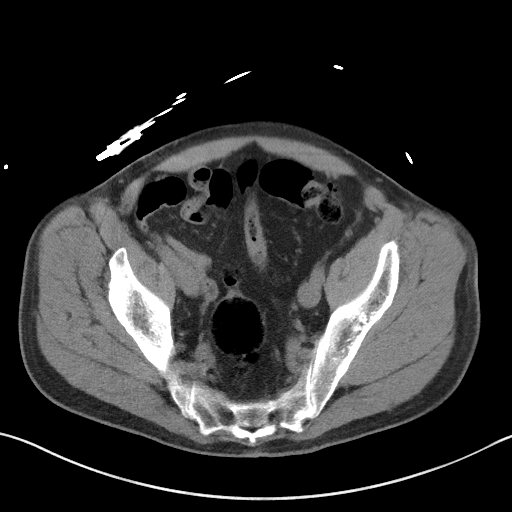
[im 40/103  soft-tissue]
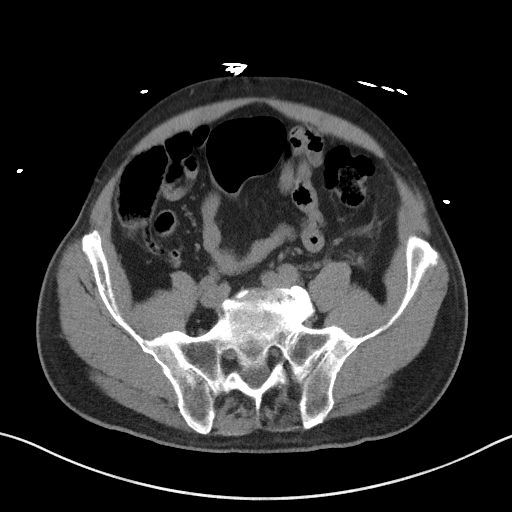
[im 49/103  soft-tissue]
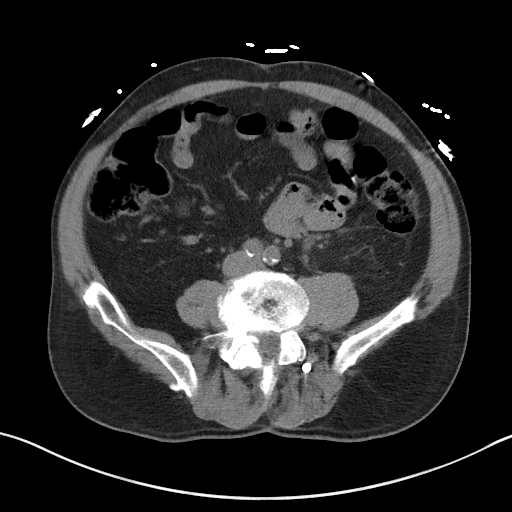
[im 54/103  soft-tissue]
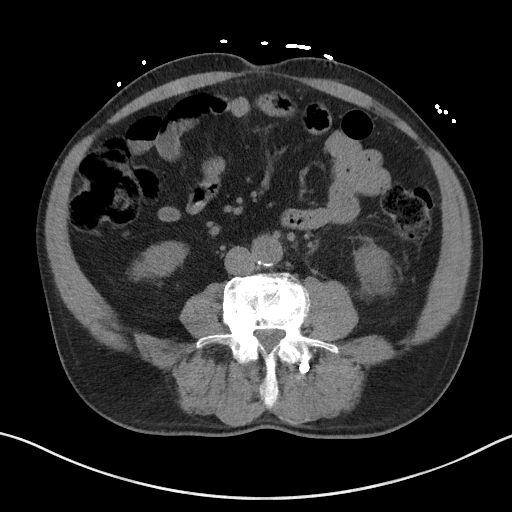
[im 63/103  soft-tissue]
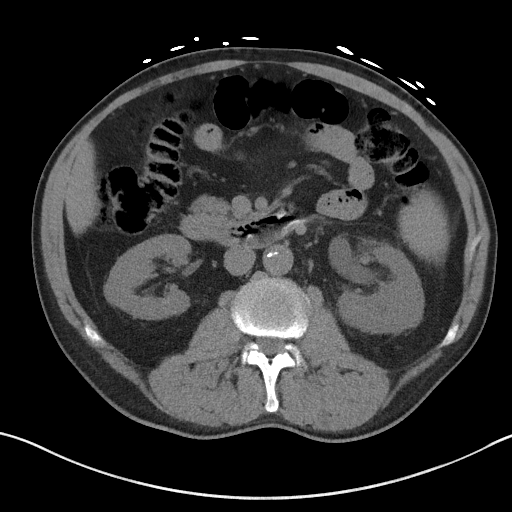
[im 71/103  soft-tissue]
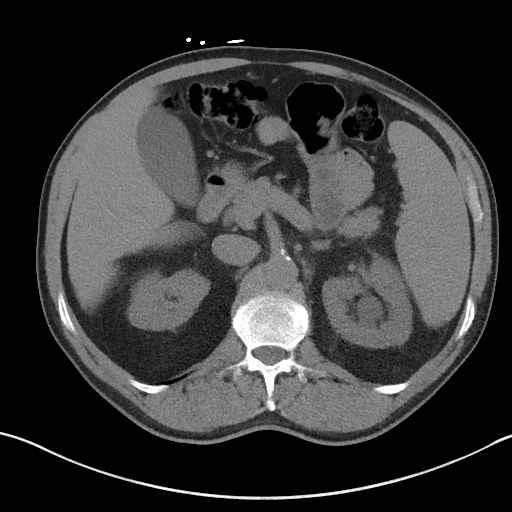
[im 71/103  bone]
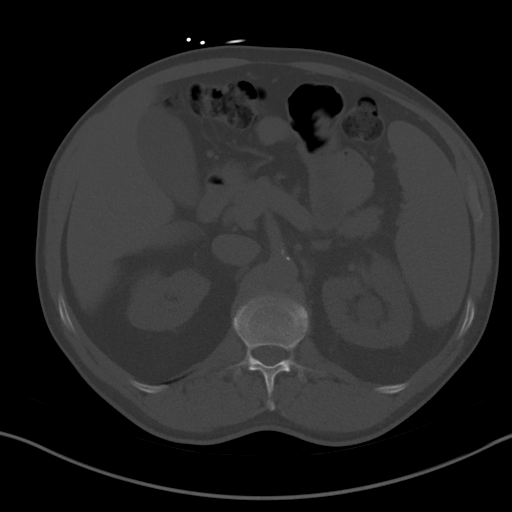
[im 80/103  soft-tissue]
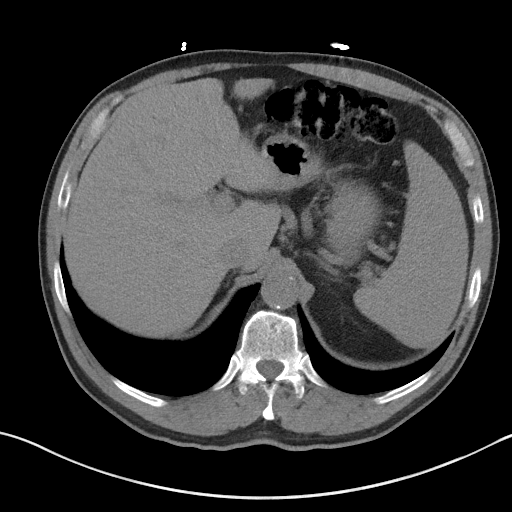
[im 89/103  soft-tissue]
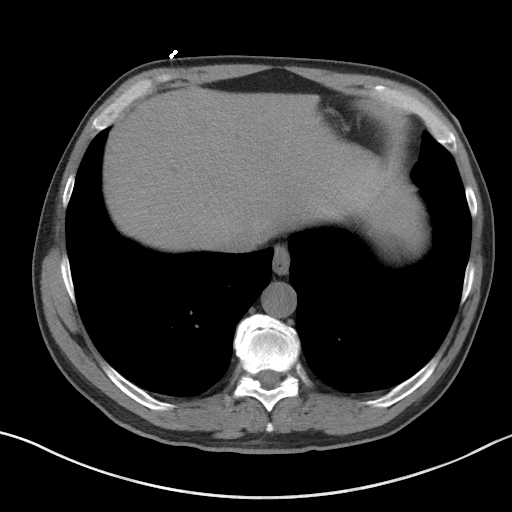
[im 98/103  soft-tissue]
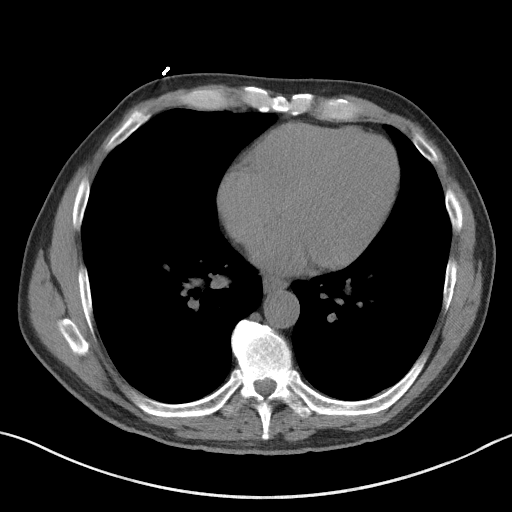

[Series 5: coronal · coronal · 0.74mm/px · 3 of 160 slices shown]
[im 54/160  soft-tissue]
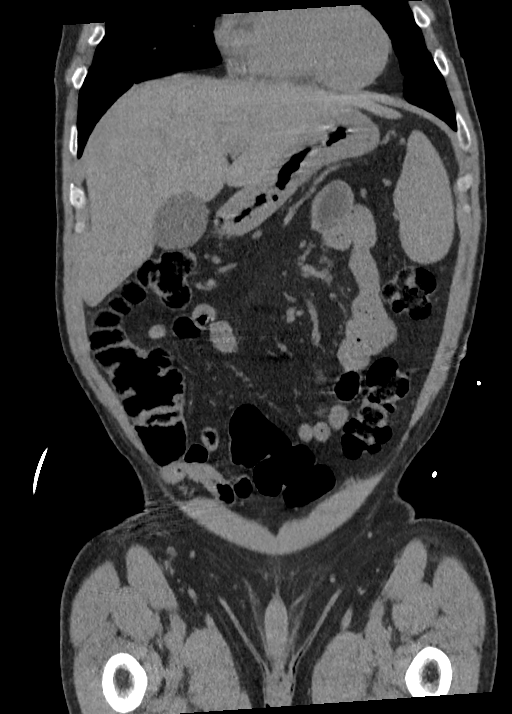
[im 71/160  soft-tissue]
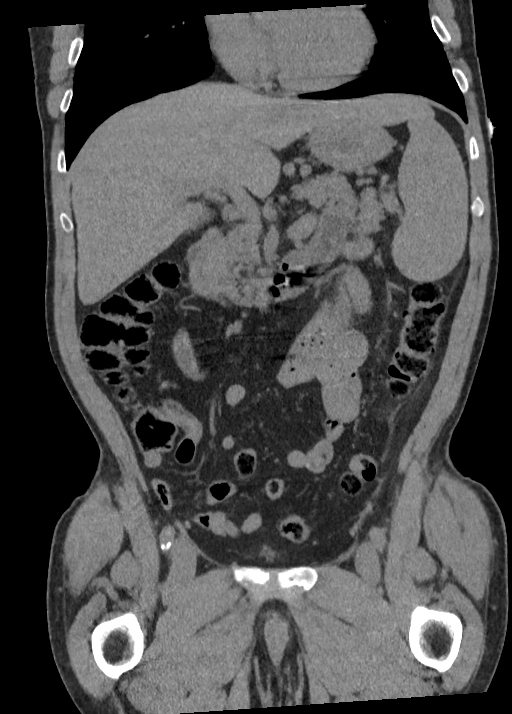
[im 89/160  soft-tissue]
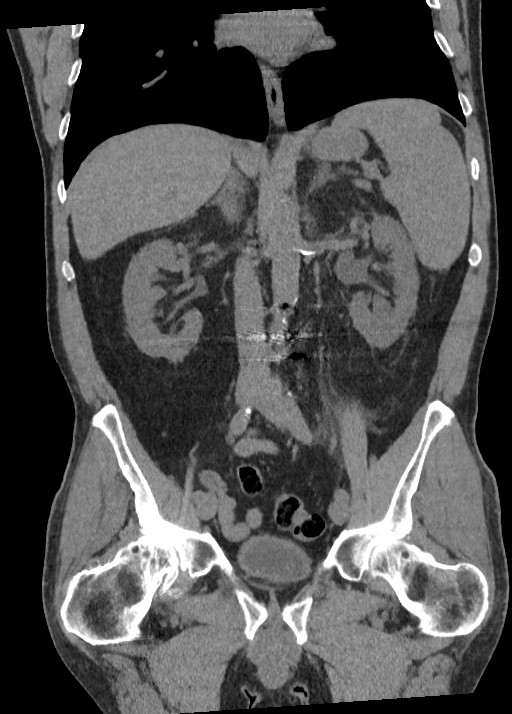

[15 of 46 positions shown; findings below may reference images not displayed]

FINDINGS: Lower chest: Lung bases clear.  No pleural or pericardial effusion.

Hepatobiliary: No focal liver abnormality is seen. No gallstones,
gallbladder wall thickening, or biliary dilatation.

Pancreas: Unremarkable. No pancreatic ductal dilatation or
surrounding inflammatory changes.

Spleen: Normal in size without focal abnormality.

Adrenals/Urinary Tract: The adrenal glands are negative.

The patient has mild to moderate left hydronephrosis with extensive
stranding about the left kidney and proximal ureter due to a 0.6 cm
proximal left ureteral stone. A 0.5 cm nonobstructing stone is
present in the upper pole of the right kidney. No hydronephrosis on
the right. No other urinary tract stones are present on the right or
left.

Stomach/Bowel: Stomach is within normal limits. Appendix appears
normal. No evidence of bowel wall thickening, distention, or
inflammatory changes.

Vascular/Lymphatic: Aortic atherosclerosis. No enlarged abdominal or
pelvic lymph nodes.

Reproductive: Mild prostatomegaly.

Other: None.

Musculoskeletal: No acute or focal abnormality. The patient is
status post L3-4 discectomy. Degenerative disc disease L4-5 and
L5-S1 noted. The patient also has bilateral hip osteoarthritis which
is severe on the right. Right hip osteoarthritis has markedly
progressed since the prior CT.
IMPRESSION: Mild-to-moderate left hydronephrosis due to a 0.6 cm proximal left
ureteral stone.

0.5 cm nonobstructing stone right kidney.

Right much worse than left hip osteoarthritis. Degenerative change
on the right has worsened since the prior exam.

Mild prostatomegaly.

Aortic Atherosclerosis ([id number]).

## 2021-09-28 ENCOUNTER — Ambulatory Visit
Admission: RE | Admit: 2021-09-28 | Discharge: 2021-09-28 | Disposition: A | Discharge status: Home / Self Care | Payer: Medicare Other | Source: Ambulatory Visit | Attending: Urology | Admitting: Urology

## 2021-09-28 ENCOUNTER — Other Ambulatory Visit: Payer: Self-pay

## 2021-09-28 ENCOUNTER — Ambulatory Visit: Payer: Medicare Other | Admitting: Urology

## 2021-09-28 ENCOUNTER — Encounter: Payer: Self-pay | Admitting: Urology

## 2021-09-28 ENCOUNTER — Ambulatory Visit
Admission: RE | Admit: 2021-09-28 | Discharge: 2021-09-28 | Disposition: A | Discharge status: Home / Self Care | Payer: Medicare Other | Attending: Urology | Admitting: Urology

## 2021-09-28 VITALS — BP 134/68 | HR 81 | Ht 70.0 in | Wt 179.7 lb

## 2021-09-28 DIAGNOSIS — N529 Male erectile dysfunction, unspecified: Secondary | ICD-10-CM

## 2021-09-28 DIAGNOSIS — N401 Enlarged prostate with lower urinary tract symptoms: Secondary | ICD-10-CM

## 2021-09-28 DIAGNOSIS — N201 Calculus of ureter: Secondary | ICD-10-CM

## 2021-09-28 DIAGNOSIS — N200 Calculus of kidney: Secondary | ICD-10-CM

## 2021-09-28 DIAGNOSIS — N3281 Overactive bladder: Secondary | ICD-10-CM | POA: Diagnosis not present

## 2021-09-28 DIAGNOSIS — N138 Other obstructive and reflux uropathy: Secondary | ICD-10-CM

## 2021-09-28 DIAGNOSIS — M549 Dorsalgia, unspecified: Secondary | ICD-10-CM | POA: Diagnosis not present

## 2021-09-28 LAB — URINALYSIS, COMPLETE
Bilirubin, UA: NEGATIVE
Glucose, UA: NEGATIVE
Ketones, UA: NEGATIVE
Leukocytes,UA: NEGATIVE
Nitrite, UA: NEGATIVE
Protein,UA: NEGATIVE
RBC, UA: NEGATIVE
Specific Gravity, UA: >1.03 — ABNORMAL HIGH (ref 1.005–1.030)
Urobilinogen, Ur: 0.2 mg/dL (ref 0.2–1.0)
pH, UA: 5.5 (ref 5.0–7.5)

## 2021-09-28 LAB — MICROSCOPIC EXAMINATION
Bacteria, UA: NONE SEEN
RBC, Urine: NONE SEEN /hpf (ref 0–2)

## 2021-09-28 MED ORDER — TAMSULOSIN HCL 0.4 MG PO CAPS: 0.4000 mg | ORAL_CAPSULE | Freq: Every day | ORAL | 6 refills | Status: DC

## 2021-09-28 MED ORDER — TADALAFIL 5 MG PO TABS: 5.0000 mg | ORAL_TABLET | Freq: Every day | ORAL | 11 refills | Status: DC | PRN

## 2021-09-28 NOTE — Progress Notes (Signed)
? ?  09/28/2021 ?4:27 PM  ? ?Shawn Sherman ?[birth year] ?[id number] ? ?Reason for visit: Follow up nephrolithiasis, urinary symptoms, ED ? ?HPI: ?66 year old male who underwent urgent stent placement in July 2022 for a 1 cm left proximal ureteral stone with sepsis from urinary source.  He underwent follow-up ureteroscopy in August 2022, and subsequent stent removal.  He reports some right-sided flank pain over the last few weeks he feels could be related to a kidney stone.  He denies any gross hematuria.  He has some baseline urgency, frequency, nocturia 3-4 times per night.  He is never tried any medications for this. ? ?I personally viewed and interpreted his KUB today that shows no obvious calcifications.  Urinalysis today is pending. ? ?He also reports trouble with erections and is interested in trying medications for this.  Risks and benefits of Cialis discussed, and he opted for trial of 5 to 20 mg on demand. ? ?-Urinalysis today, call with results.  If microscopic hematuria consider CT to evaluate for possible ureteral stone ?-Trial of Flomax for urinary symptoms as well as possible stone ?-Trial of Cialis 5 to 20 mg on demand for ED ? ? ?Sondra Come, MD ? ?[facility] ?[address] ?[place], [place] [address] ?[phone] ? ? ?

## 2021-09-29 ENCOUNTER — Telehealth: Payer: Self-pay

## 2021-09-29 ENCOUNTER — Other Ambulatory Visit: Payer: Self-pay | Admitting: Family Medicine

## 2021-09-29 DIAGNOSIS — E782 Mixed hyperlipidemia: Secondary | ICD-10-CM

## 2021-09-29 NOTE — Telephone Encounter (Signed)
-----   Message from Billey Co, MD sent at 09/28/2021  5:18 PM EDT ----- ?Urinalysis completely normal with no blood or inflammation, unlikely to be kidney stone as source of his flank pain.  Recommend 23-month follow-up with [facility] to check on urinary symptoms and ED. ? ?Nickolas Madrid, MD ?09/28/2021 ? ? ?

## 2021-09-29 NOTE — Telephone Encounter (Signed)
Called pt informed him of the information below. Pt voiced understanding. Appt scheduled.  

## 2021-10-09 ENCOUNTER — Encounter: Payer: Self-pay | Admitting: Family Medicine

## 2021-10-09 ENCOUNTER — Ambulatory Visit (INDEPENDENT_AMBULATORY_CARE_PROVIDER_SITE_OTHER): Payer: Medicare Other | Admitting: Family Medicine

## 2021-10-09 VITALS — BP 118/75 | HR 97 | Temp 98.2°F | Resp 16 | Wt 186.3 lb

## 2021-10-09 DIAGNOSIS — I152 Hypertension secondary to endocrine disorders: Secondary | ICD-10-CM

## 2021-10-09 DIAGNOSIS — Z23 Encounter for immunization: Secondary | ICD-10-CM

## 2021-10-09 DIAGNOSIS — E785 Hyperlipidemia, unspecified: Secondary | ICD-10-CM

## 2021-10-09 DIAGNOSIS — R7303 Prediabetes: Secondary | ICD-10-CM | POA: Diagnosis not present

## 2021-10-09 DIAGNOSIS — Z1211 Encounter for screening for malignant neoplasm of colon: Secondary | ICD-10-CM | POA: Diagnosis not present

## 2021-10-09 DIAGNOSIS — E1169 Type 2 diabetes mellitus with other specified complication: Secondary | ICD-10-CM | POA: Diagnosis not present

## 2021-10-09 DIAGNOSIS — E1159 Type 2 diabetes mellitus with other circulatory complications: Secondary | ICD-10-CM | POA: Diagnosis not present

## 2021-10-09 DIAGNOSIS — M7061 Trochanteric bursitis, right hip: Secondary | ICD-10-CM

## 2021-10-09 LAB — POCT GLYCOSYLATED HEMOGLOBIN (HGB A1C)
Est. average glucose Bld gHb Est-mCnc: 143
Hemoglobin A1C: 6.6 % — AB (ref 4.0–5.6)

## 2021-10-09 NOTE — Assessment & Plan Note (Signed)
Previously well controlled Continue statin and zetia Repeat FLP and CMP Goal LDL < 70 

## 2021-10-09 NOTE — Assessment & Plan Note (Signed)
Recurrent issue ?Has tried OTCs without relief ?Has previously gotten relief x3 months or so from corticosteroid injections nad wishes to repeat today ?Advised HEP, rest, ice, NSAIDs prn ? ?INJECTION: ?Patient was given informed consent,. Appropriate time out was taken. Area prepped and draped in usual sterile fashion. 1 cc of depo-medrol 40 mg/ml plus  4 cc of 1% lidocaine without epinephrine was injected into the R greater trochanteric bursa using a(n) lateral approach. The patient tolerated the procedure well. There were no complications. Post procedure instructions were given.  ?

## 2021-10-09 NOTE — Assessment & Plan Note (Signed)
Well controlled Continue current medications Recheck metabolic panel 

## 2021-10-09 NOTE — Assessment & Plan Note (Signed)
New diagnosis  ?Prediabetes has now progresses ?Discussed diet and exercise ?Will not start meds yet and allow time for lifestyle interventions ?Discussed need for eye exam ?Foot exam, uACR, pcv20 today ?F/u in 37m and repeat A1c ?

## 2021-10-09 NOTE — Progress Notes (Signed)
?  ? ?I,Sulibeya S Dimas,acting as a scribe for Lavon Paganini, MD.,have documented all relevant documentation on the behalf of Lavon Paganini, MD,as directed by  Lavon Paganini, MD while in the presence of Lavon Paganini, MD. ? ? ?Established patient visit ? ? ?Patient: Shawn Sherman   DOB: Jan 19, 1956   66 y.o. Male  MRN: [id number] ?Visit Date: 10/09/2021 ? ?Today's healthcare provider: Lavon Paganini, MD  ? ?Chief Complaint  ?Patient presents with  ? Hypertension  ? Hyperlipidemia  ? Hyperglycemia  ? Hip Pain  ? ?Subjective  ?  ?Hip Pain  ?There was no injury mechanism. The pain is present in the right hip. The quality of the pain is described as aching and shooting. The pain is moderate. The pain has been Constant since onset. The symptoms are aggravated by weight bearing.  patient requesting cortisone injection. Patient reports gabapentin is not helping with pain.  ?Hypertension, follow-up ? ?BP Readings from Last 3 Encounters:  ?10/09/21 118/75  ?09/28/21 134/68  ?04/05/21 122/71  ? Wt Readings from Last 3 Encounters:  ?10/09/21 186 lb 4.8 oz (84.5 kg)  ?09/28/21 179 lb 11.2 oz (81.5 kg)  ?04/05/21 169 lb (76.7 kg)  ?  ? ?He was last seen for hypertension 1  and 9 months  ago.  ?BP at that visit was 116/70. ?Management since that visit includes Continue current medications. ?He reports excellent compliance with treatment. ?He is not having side effects.  ?He is not exercising. ?He is adherent to low salt diet.   ?Outside blood pressures are not being checked. ? ?He does not smoke. Patient chews tobacco. ? ?--------------------------------------------------------------------------------------------------- ?Lipid/Cholesterol, follow-up ? ?Last Lipid Panel: ?Lab Results  ?Component Value Date  ? CHOL 143 03/23/2020  ? [place] 70 03/23/2020  ? HDL 51 03/23/2020  ? TRIG 124 03/23/2020  ? ? ?He was last seen for this 1  and 9 months  ago.  ?Management since that visit includes Continue statin ?Also  followed by vascular surgery and cardiology. ? ?He reports excellent compliance with treatment. ?He is not having side effects.  ? ?Symptoms: ?No appetite changes No foot ulcerations  ?No chest pain No chest pressure/discomfort  ?No dyspnea No orthopnea  ?No fatigue No lower extremity edema  ?No palpitations No paroxysmal nocturnal dyspnea  ?No nausea Yes numbness or tingling of extremity  ?No polydipsia No polyuria  ?No speech difficulty No syncope  ? ?He is following a Low Sodium diet. ?Current exercise: walking ? ?Last metabolic panel ?Lab Results  ?Component Value Date  ? GLUCOSE 180 (H) 02/09/2021  ? NA 138 02/09/2021  ? K 4.9 02/09/2021  ? BUN 18 02/09/2021  ? CREATININE 0.98 02/09/2021  ? EGFR 86 02/09/2021  ? GFRNONAA >60 01/27/2021  ? CALCIUM 9.3 02/09/2021  ? AST 21 01/26/2021  ? ALT 20 01/26/2021  ? ?The 10-year ASCVD risk score (Arnett DK, et al., 2019) is: 18.6% ? ?---------------------------------------------------------------------------------------------------  ?Prediabetes, Follow-up ? ?Lab Results  ?Component Value Date  ? HGBA1C 6.6 (A) 10/09/2021  ? HGBA1C 6.2 (A) 12/31/2019  ? GLUCOSE 180 (H) 02/09/2021  ? GLUCOSE 288 (H) 01/27/2021  ? GLUCOSE 184 (H) 01/26/2021  ? ? ?Last seen for for this1  and 9 months  ago.  ?Management since that visit includes Discussed importance of low-carb diet and exercise ?Continue to monitor every 6 months. ?Current symptoms include none and have been stable. ? ?Prior visit with dietician: no ?Current diet: in general, a "healthy" diet   ?Current exercise: walking ? ?  Pertinent Labs: ?   ?Component Value Date/Time  ? CHOL 143 03/23/2020 1230  ? TRIG 124 03/23/2020 1230  ? CHOLHDL 2.8 03/23/2020 1230  ? CHOLHDL 2.7 04/19/2017 1143  ? CREATININE 0.98 02/09/2021 1305  ? ? ?Wt Readings from Last 3 Encounters:  ?10/09/21 186 lb 4.8 oz (84.5 kg)  ?09/28/21 179 lb 11.2 oz (81.5 kg)  ?04/05/21 169 lb (76.7 kg)   ? ? ?-----------------------------------------------------------------------------------------  ?Medications: ?Outpatient Medications Prior to Visit  ?Medication Sig  ? amLODipine (NORVASC) 10 MG tablet Take 1 tablet (10 mg total) by mouth daily. NEED APPOINTMENT  ? aspirin 81 MG chewable tablet Chew 81 mg by mouth daily.  ? atorvastatin (LIPITOR) 80 MG tablet TAKE 1 TABLET BY MOUTH ONCE EVERY EVENING. *MAKE APPOINTMENT FOR FURTHER FILLS*  ? clopidogrel (PLAVIX) 75 MG tablet TAKE 1 TABLET BY MOUTH ONCE DAILY WITH BREAKFAST  ? diclofenac Sodium (VOLTAREN) 1 % GEL Apply 1 application topically 4 (four) times daily as needed (pain).  ? esomeprazole (NEXIUM) 40 MG capsule TAKE 1 CAPSULE BY MOUTH ONCE EVERY EVENING  ? ezetimibe (ZETIA) 10 MG tablet TAKE 1 TABLET BY MOUTH ONCE DAILY (Patient taking differently: Take 10 mg by mouth every morning. TAKE 1 TABLET BY MOUTH ONCE DAILY)  ? gabapentin (NEURONTIN) 100 MG capsule TAKE 1 CAPSULE BY MOUTH 3 TIMES DAILY  ? losartan (COZAAR) 25 MG tablet Take 0.5 tablets (12.5 mg total) by mouth daily.  ? OVER THE COUNTER MEDICATION Take 1 tablet by mouth daily. Tamaflex otc supplement  ? tadalafil (CIALIS) 5 MG tablet Take 1-4 tablets (5-20 mg total) by mouth daily as needed for erectile dysfunction.  ? tamsulosin (FLOMAX) 0.4 MG CAPS capsule Take 1 capsule (0.4 mg total) by mouth daily.  ? Menthol, Topical Analgesic, (ICY HOT EX) Apply 1 application topically daily as needed (pain). (Patient not taking: Reported on 10/09/2021)  ? Multiple Vitamins-Minerals (AIRBORNE PO) Take 1 tablet by mouth daily. (Patient not taking: Reported on 10/09/2021)  ? [DISCONTINUED] potassium chloride (KLOR-CON M) 10 MEQ tablet Take 10 mEq by mouth 2 (two) times daily. (Patient not taking: Reported on 10/09/2021)  ? [DISCONTINUED] traMADol (ULTRAM) 50 MG tablet Take 50 mg by mouth every 6 (six) hours as needed for moderate pain. (Patient not taking: Reported on 10/09/2021)  ? ?No facility-administered  medications prior to visit.  ? ? ?Review of Systems  ?Constitutional:  Negative for appetite change.  ?Respiratory:  Negative for chest tightness.   ?Musculoskeletal:   ?     Right hip pain  ? ?  ?  Objective  ?  ?BP 118/75 (BP Location: Left Arm, Patient Position: Sitting, Cuff Size: Large)   Pulse 97   Temp 98.2 ?F (36.8 ?C) (Temporal)   Resp 16   Wt 186 lb 4.8 oz (84.5 kg)   BMI 26.73 kg/m?  ?BP Readings from Last 3 Encounters:  ?10/09/21 118/75  ?09/28/21 134/68  ?04/05/21 122/71  ? ?Wt Readings from Last 3 Encounters:  ?10/09/21 186 lb 4.8 oz (84.5 kg)  ?09/28/21 179 lb 11.2 oz (81.5 kg)  ?04/05/21 169 lb (76.7 kg)  ? ?  ? ?Physical Exam ?Vitals reviewed.  ?Constitutional:   ?   General: He is not in acute distress. ?   Appearance: Normal appearance. He is not diaphoretic.  ?HENT:  ?   Head: Normocephalic and atraumatic.  ?Eyes:  ?   General: No scleral icterus. ?   Conjunctiva/sclera: Conjunctivae normal.  ?Cardiovascular:  ?   Rate and  Rhythm: Normal rate and regular rhythm.  ?   Heart sounds: Normal heart sounds. No murmur heard. ?Pulmonary:  ?   Effort: Pulmonary effort is normal. No respiratory distress.  ?   Breath sounds: Normal breath sounds. No wheezing or rhonchi.  ?Musculoskeletal:  ?   Cervical back: Neck supple.  ?   Right lower leg: No edema.  ?   Left lower leg: No edema.  ?   Comments: TTP over R greater trochanter  ?Lymphadenopathy:  ?   Cervical: No cervical adenopathy.  ?Skin: ?   General: Skin is warm and dry.  ?   Findings: No rash.  ?Neurological:  ?   Mental Status: He is alert and oriented to person, place, and time.  ?   Cranial Nerves: No cranial nerve deficit.  ?Psychiatric:     ?   Mood and Affect: Mood normal.     ?   Behavior: Behavior normal.  ?  ?Diabetic Foot Exam - Simple   ?Simple Foot Form ?Diabetic Foot exam was performed with the following findings: Yes 10/09/2021  2:48 PM  ?Visual Inspection ?No deformities, no ulcerations, no other skin breakdown bilaterally:  Yes ?Sensation Testing ?Intact to touch and monofilament testing bilaterally: Yes ?Pulse Check ?Posterior Tibialis and Dorsalis pulse intact bilaterally: Yes ?Comments ?  ?  ? ?Results for orders placed or performed in visit on 10/09/21  ?POCT HgB A1C  ?Result Value Ref Range  ? Hemoglobin A1

## 2021-10-10 ENCOUNTER — Telehealth: Payer: Self-pay

## 2021-10-10 LAB — COMPREHENSIVE METABOLIC PANEL
ALT: 21 IU/L (ref 0–44)
AST: 19 IU/L (ref 0–40)
Albumin/Globulin Ratio: 2.7 — ABNORMAL HIGH (ref 1.2–2.2)
Albumin: 4.6 g/dL (ref 3.8–4.8)
Alkaline Phosphatase: 121 IU/L (ref 44–121)
BUN/Creatinine Ratio: 14 (ref 10–24)
BUN: 12 mg/dL (ref 8–27)
Bilirubin Total: 0.5 mg/dL (ref 0.0–1.2)
CO2: 23 mmol/L (ref 20–29)
Calcium: 9.4 mg/dL (ref 8.6–10.2)
Chloride: 105 mmol/L (ref 96–106)
Creatinine, Ser: 0.84 mg/dL (ref 0.76–1.27)
Globulin, Total: 1.7 g/dL (ref 1.5–4.5)
Glucose: 156 mg/dL — ABNORMAL HIGH (ref 70–99)
Potassium: 3.9 mmol/L (ref 3.5–5.2)
Sodium: 143 mmol/L (ref 134–144)
Total Protein: 6.3 g/dL (ref 6.0–8.5)
eGFR: 97 mL/min/{1.73_m2} (ref >59)

## 2021-10-10 LAB — MICROALBUMIN / CREATININE URINE RATIO
Creatinine, Urine: 300.2 mg/dL
Microalb/Creat Ratio: 7 mg/g creat (ref 0–29)
Microalbumin, Urine: 22.5 ug/mL

## 2021-10-10 LAB — LIPID PANEL
Chol/HDL Ratio: 2.8 ratio (ref 0.0–5.0)
Cholesterol, Total: 154 mg/dL (ref 100–199)
HDL: 56 mg/dL (ref >39)
LDL Chol Calc (NIH): 75 mg/dL (ref 0–99)
Triglycerides: 129 mg/dL (ref 0–149)
VLDL Cholesterol Cal: 23 mg/dL (ref 5–40)

## 2021-10-10 NOTE — Telephone Encounter (Signed)
CALLED PATIENT NO ANSWER LEFT VOICEMAIL FOR A CALL BACK ? ?

## 2021-10-11 ENCOUNTER — Telehealth: Payer: Self-pay

## 2021-10-11 NOTE — Telephone Encounter (Signed)
CALLED PATIENT NO ANSWER LEFT VOICEMAIL FOR A CALL BACK ? ?

## 2021-10-30 ENCOUNTER — Other Ambulatory Visit: Payer: Self-pay | Admitting: Family Medicine

## 2021-10-30 DIAGNOSIS — E782 Mixed hyperlipidemia: Secondary | ICD-10-CM

## 2021-11-04 ENCOUNTER — Other Ambulatory Visit: Payer: Self-pay | Admitting: Family Medicine

## 2021-11-04 DIAGNOSIS — K209 Esophagitis, unspecified without bleeding: Secondary | ICD-10-CM

## 2021-11-06 NOTE — Telephone Encounter (Signed)
Requested Prescriptions  ?Pending Prescriptions Disp Refills  ?? esomeprazole (NEXIUM) 40 MG capsule [Pharmacy Med Name: ESOMEPRAZOLE MAGNESIUM 40 MG CAP] 90 capsule 0  ?  Sig: TAKE 1 CAPSULE BY MOUTH ONCE EVERY EVENING  ?  ? Gastroenterology: Proton Pump Inhibitors 2 Passed - 11/04/2021  9:54 AM  ?  ?  Passed - ALT in normal range and within 360 days  ?  ALT  ?Date Value Ref Range Status  ?10/09/2021 21 0 - 44 IU/L Final  ?   ?  ?  Passed - AST in normal range and within 360 days  ?  AST  ?Date Value Ref Range Status  ?10/09/2021 19 0 - 40 IU/L Final  ?   ?  ?  Passed - Valid encounter within last 12 months  ?  Recent Outpatient Visits   ?      ? 4 weeks ago Type 2 diabetes mellitus with other specified complication, without long-term current use of insulin (HCC)  ? [facility] [place], Marzella Schlein, MD  ? 9 months ago Urinary tract obstruction due to kidney stone  ? [facility] Chrismon, Jodell Cipro, PA-C  ? 1 year ago Right hip pain  ? [facility] Chrismon, Jodell Cipro, PA-C  ? 1 year ago Essential hypertension  ? [facility] [place], Marzella Schlein, MD  ? 3 years ago Right flank pain  ? [facility] Bacigal[place], Marzella Schlein, MD  ?  ?  ?Future Appointments   ?        ? In 2 months Bacigalupo, Marzella Schlein, MD [facility]  ?  ? ?  ?  ?  ? ?

## 2021-11-13 ENCOUNTER — Other Ambulatory Visit (INDEPENDENT_AMBULATORY_CARE_PROVIDER_SITE_OTHER): Payer: Self-pay | Admitting: Vascular Surgery

## 2021-12-24 ENCOUNTER — Other Ambulatory Visit: Payer: Self-pay | Admitting: Family

## 2021-12-24 DIAGNOSIS — I10 Essential (primary) hypertension: Secondary | ICD-10-CM

## 2021-12-25 NOTE — Telephone Encounter (Signed)
Pt of Dr. Kirke Corin in [place] office. Please review for refill. Thank you!

## 2021-12-25 NOTE — Telephone Encounter (Signed)
Hi, could you contact the patient to schedule an over due follow up visit? Thank you so much.

## 2021-12-27 DIAGNOSIS — Z0100 Encounter for examination of eyes and vision without abnormal findings: Secondary | ICD-10-CM | POA: Diagnosis not present

## 2022-01-05 NOTE — Progress Notes (Deleted)
    Established patient visit   Patient: Shawn Sherman   DOB: 12/13/1955   65 y.o. Male  MRN: [id number] Visit Date: 01/11/2022  Today's healthcare provider: Elise T Payne, FNP   No chief complaint on file.  Subjective    HPI  Diabetes Mellitus Type II, Follow-up  Lab Results  Component Value Date   HGBA1C 6.6 (A) 10/09/2021   HGBA1C 6.2 (A) 12/31/2019   Wt Readings from Last 3 Encounters:  10/09/21 186 lb 4.8 oz (84.5 kg)  09/28/21 179 lb 11.2 oz (81.5 kg)  04/05/21 169 lb (76.7 kg)   Last seen for diabetes 3 months ago.  Management since then includes diet and exercise suggested. He reports {excellent/good/fair/poor:19665} compliance with treatment. He {is/is not:21021397} having side effects. {document side effects if present:1} Symptoms: {Yes/No:20286} fatigue {Yes/No:20286} foot ulcerations  {Yes/No:20286} appetite changes {Yes/No:20286} nausea  {Yes/No:20286} paresthesia of the feet  {Yes/No:20286} polydipsia  {Yes/No:20286} polyuria {Yes/No:20286} visual disturbances   {Yes/No:20286} vomiting     Home blood sugar records: {diabetes glucometry results:16657}  Episodes of hypoglycemia? {Yes/No:20286} {enter symptoms and frequency of symptoms if yes:1}   Current insulin regiment: {enter 'none' or type of insulin and number of units taken with each dose of each insulin formulation that the patient is taking:1} Most Recent Eye Exam: *** {Current exercise:16438:::1} {Current diet habits:16563:::1}  Pertinent Labs: Lab Results  Component Value Date   CHOL 154 10/09/2021   HDL 56 10/09/2021   LDLCALC 75 10/09/2021   TRIG 129 10/09/2021   CHOLHDL 2.8 10/09/2021   Lab Results  Component Value Date   NA 143 10/09/2021   K 3.9 10/09/2021   CREATININE 0.84 10/09/2021   EGFR 97 10/09/2021   LABMICR 22.5 10/09/2021     ---------------------------------------------------------------------------------------------------   Medications: Outpatient Medications  Prior to Visit  Medication Sig   amLODipine (NORVASC) 10 MG tablet TAKE 1 TABLET BY MOUTH ONCE DAILY   aspirin 81 MG chewable tablet Chew 81 mg by mouth daily.   atorvastatin (LIPITOR) 80 MG tablet TAKE 1 TABLET BY MOUTH ONCE EVERY EVENING. *MAKE APPOINTMENT FOR FURTHER FILLS*   clopidogrel (PLAVIX) 75 MG tablet TAKE 1 TABLET BY MOUTH ONCE DAILY WITH BREAKFAST   diclofenac Sodium (VOLTAREN) 1 % GEL Apply 1 application topically 4 (four) times daily as needed (pain).   esomeprazole (NEXIUM) 40 MG capsule TAKE 1 CAPSULE BY MOUTH ONCE EVERY EVENING   ezetimibe (ZETIA) 10 MG tablet TAKE 1 TABLET BY MOUTH ONCE DAILY (Patient taking differently: Take 10 mg by mouth every morning. TAKE 1 TABLET BY MOUTH ONCE DAILY)   gabapentin (NEURONTIN) 100 MG capsule TAKE 1 CAPSULE BY MOUTH 3 TIMES DAILY   losartan (COZAAR) 25 MG tablet Take 0.5 tablets (12.5 mg total) by mouth daily.   Menthol, Topical Analgesic, (ICY HOT EX) Apply 1 application topically daily as needed (pain). (Patient not taking: Reported on 10/09/2021)   Multiple Vitamins-Minerals (AIRBORNE PO) Take 1 tablet by mouth daily. (Patient not taking: Reported on 10/09/2021)   OVER THE COUNTER MEDICATION Take 1 tablet by mouth daily. Tamaflex otc supplement   tadalafil (CIALIS) 5 MG tablet Take 1-4 tablets (5-20 mg total) by mouth daily as needed for erectile dysfunction.   tamsulosin (FLOMAX) 0.4 MG CAPS capsule Take 1 capsule (0.4 mg total) by mouth daily.   No facility-administered medications prior to visit.    Review of Systems  {Labs  Heme  Chem  Endocrine  Serology  Results Review (optional):23779}   Objective      There were no vitals taken for this visit. {Show previous vital signs (optional):23777}  Physical Exam  ***  No results found for any visits on 01/11/22.  Assessment & Plan     ***  No follow-ups on file.      {provider attestation***:1}   Gwyneth Sprout, [place] [phone]  (phone) [phone] (fax)  [place]

## 2022-01-10 ENCOUNTER — Ambulatory Visit: Payer: Medicare Other | Admitting: Urology

## 2022-01-11 ENCOUNTER — Ambulatory Visit: Payer: Medicare Other | Admitting: Medical

## 2022-01-11 ENCOUNTER — Ambulatory Visit: Payer: Medicare Other | Admitting: Family Medicine

## 2022-01-16 NOTE — Progress Notes (Unsigned)
Cardiology Office Note:    Date:  01/17/2022   ID:  Shawn Sherman, DOB 1956-05-03, MRN [id number]  PCP:  Erasmo Downer, MD  [facility] Cardiologist:  Lorine Bears, MD  [facility] Electrophysiologist:  None   Referring MD: Erasmo Downer, MD   Chief Complaint: 6 month follow-up  History of Present Illness:    Shawn Sherman is a 66 y.o. male with a hx of HTN, HLD, tobacco use, carotid artery disease s/p bilateral carotid endarterectomies presents today for 6 month follow-up.  Seen 08/2018 for chest pain and noted to have left carotid bruit. Treadmill myoview with ST depression with exercise but reports on symptoms and perfusion imaging was normal. He underwent bilateral carotid endarterectomies 01/2019 with Dr. Wyn Quaker of [facility] for severe bilateral carotid stenosis.    Seen in clinic 12/27/2019 was noted to be out of his amlodipine for 6 months. His blood pressure was elevated and started amlodipine 10 mg daily was resumed. He was also recommended for the addition of Zetia as lipid panel 12/14/19 with LDL 96.    Seen in follow-up by Leafy Kindle, PA. Repeat lab work 03/18/2020 with total cholesterol 143, HDL 51, LDL 70. He was started on losartan 12.5 mg daily as his BP was not at goal. He was seen in follow up 04/22/20. His BP was at goal after addition of Losartan. He was awaiting shoulder surgery. Underwent shoulder athroplasty 08/22/20.  Last seen 09/12/20 and was overall feeling well. EKG showed ST with a hear rate of 110bpm, a 1 day heart monitor was placed. This showed predominately NSR with PVCs and PACs, 1 episode of SVT lasting 4 beats.   Today, the patient reports he has been doing well. He had prior kidney stone surgery. No chest pain, SOB, LLE, orthopnea, pnd, palpitations. He has rare dizziness or lightheadedness when he stands up to quickly. He just retired a week ago.   Past Medical History:  Diagnosis Date   AKI (acute kidney injury) (HCC) 01/26/2021   Anginal pain  (HCC) 08/2018   a. 08/2018 - atyp c/p and neg trop; b. 08/2018 MV: EF 57%, inferolateral horizontal ST depression w/o ischemia or infarct.   Arthritis    Carotid arterial disease (HCC)    a. 01/2019 Carotid U/S: RICA 80-90, RCCA <50, RECA>50, LICA 80-99, LCCA>50, LECA>50; b. 01/2019 CT Head/Neck: RICA string sign in bulb - lumen <55mm. Sev prox RECA dzs. LICA 60 @ dist bulb. R vert 50-70, L vert 30-50.   Carotid bruit present 08/2018   left   Chewing tobacco nicotine dependence    Coronary artery disease    Essential hypertension 03/29/2017   GERD (gastroesophageal reflux disease)    Headache    migrraines   History of 2019 novel coronavirus disease (COVID-19) 08/04/2020   History of kidney stones    Hyperlipidemia    Sepsis secondary to UTI (HCC) 01/26/2021    Past Surgical History:  Procedure Laterality Date   ANTERIOR LAT LUMBAR FUSION Right 01/01/2013   Procedure: lumbar three-four extreme lateral interbody fusion;  Surgeon: Reinaldo Meeker, MD;  Location: [facility];  Service: Neurosurgery;  Laterality: Right;  Lumbar three-four extreme lateral interbody fusion     BACK SURGERY  08   COLONOSCOPY     CYSTOSCOPY WITH STENT PLACEMENT Left 01/26/2021   Procedure: CYSTOSCOPY WITH STENT PLACEMENT;  Surgeon: Sondra Come, MD;  Location: [facility];  Service: Urology;  Laterality: Left;   CYSTOSCOPY/URETEROSCOPY/HOLMIUM LASER/STENT PLACEMENT Left  02/24/2021   Procedure: CYSTOSCOPY/URETEROSCOPY/HOLMIUM LASER/STENT EXCHANGE;  Surgeon: Sondra Come, MD;  Location: [facility];  Service: Urology;  Laterality: Left;   ENDARTERECTOMY Right 02/18/2019   Procedure: ENDARTERECTOMY CAROTID;  Surgeon: Annice Needy, MD;  Location: [facility];  Service: Vascular;  Laterality: Right;   ENDARTERECTOMY Left 06/11/2019   Procedure: ENDARTERECTOMY CAROTID;  Surgeon: Annice Needy, MD;  Location: [facility];  Service: Vascular;  Laterality: Left;   ESOPHAGOGASTRODUODENOSCOPY (EGD) WITH PROPOFOL N/A 07/05/2015    Procedure: ESOPHAGOGASTRODUODENOSCOPY (EGD) WITH PROPOFOL;  Surgeon: Midge Minium, MD;  Location: [facility];  Service: Endoscopy;  Laterality: N/A;   EXTRACORPOREAL SHOCK WAVE LITHOTRIPSY Right 09/11/2018   Procedure: EXTRACORPOREAL SHOCK WAVE LITHOTRIPSY (ESWL);  Surgeon: Riki Altes, MD;  Location: [facility];  Service: Urology;  Laterality: Right;   LUMBAR PERCUTANEOUS PEDICLE SCREW 1 LEVEL N/A 01/01/2013   Procedure: lumbar three-four percutaneous pedicle screws ;  Surgeon: Reinaldo Meeker, MD;  Location: [facility];  Service: Neurosurgery;  Laterality: N/A;  lumbar three-four percutaneous pedicle screws   SHOULDER ARTHROSCOPY WITH ROTATOR CUFF REPAIR Left 08/22/2020   Procedure: SHOULDER ARTHROSCOPY WITH ROTATOR CUFF REPAIR AND DEBRIDEMENT;  Surgeon: Lyndle Herrlich, MD;  Location: [facility];  Service: Orthopedics;  Laterality: Left;    Current Medications: Current Meds  Medication Sig   amLODipine (NORVASC) 10 MG tablet TAKE 1 TABLET BY MOUTH ONCE DAILY   aspirin 81 MG chewable tablet Chew 81 mg by mouth daily.   atorvastatin (LIPITOR) 80 MG tablet TAKE 1 TABLET BY MOUTH ONCE EVERY EVENING. *MAKE APPOINTMENT FOR FURTHER FILLS*   clopidogrel (PLAVIX) 75 MG tablet TAKE 1 TABLET BY MOUTH ONCE DAILY WITH BREAKFAST   diclofenac Sodium (VOLTAREN) 1 % GEL Apply 1 application topically 4 (four) times daily as needed (pain).   esomeprazole (NEXIUM) 40 MG capsule TAKE 1 CAPSULE BY MOUTH ONCE EVERY EVENING   ezetimibe (ZETIA) 10 MG tablet TAKE 1 TABLET BY MOUTH ONCE DAILY (Patient taking differently: Take 10 mg by mouth every morning. TAKE 1 TABLET BY MOUTH ONCE DAILY)   gabapentin (NEURONTIN) 100 MG capsule TAKE 1 CAPSULE BY MOUTH 3 TIMES DAILY   losartan (COZAAR) 25 MG tablet Take 0.5 tablets (12.5 mg total) by mouth daily.   Menthol, Topical Analgesic, (ICY HOT EX) Apply 1 application  topically daily as needed (pain).   OVER THE COUNTER MEDICATION Take 1 tablet by mouth daily. Tamaflex otc  supplement   tadalafil (CIALIS) 5 MG tablet Take 1-4 tablets (5-20 mg total) by mouth daily as needed for erectile dysfunction.   tamsulosin (FLOMAX) 0.4 MG CAPS capsule Take 1 capsule (0.4 mg total) by mouth daily.     Allergies:   Patient has no known allergies.   Social History   Socioeconomic History   Marital status: Married    Spouse name: Joyce Gross   Number of children: Not on file   Years of education: Not on file   Highest education level: Not on file  Occupational History   Not on file  Tobacco Use   Smoking status: Never   Smokeless tobacco: Current    Types: Chew  Vaping Use   Vaping Use: Never used  Substance and Sexual Activity   Alcohol use: No   Drug use: No   Sexual activity: Yes    Birth control/protection: None  Other Topics Concern   Not on file  Social History Narrative   Not on file   Social Determinants of Health   Financial  Resource Strain: Not on file  Food Insecurity: Not on file  Transportation Needs: Not on file  Physical Activity: Not on file  Stress: Not on file  Social Connections: Not on file     Family History: The patient's family history includes COPD in his sister; Cancer in his father and mother.  ROS:   Please see the history of present illness.     All other systems reviewed and are negative.  EKGs/Labs/Other Studies Reviewed:    The following studies were reviewed today:  Heart monitor 09/2020 Patch Wear Time:  7 days and 5 hours (2022-03-07T12:20:19-0500 to 2022-03-14T18:39:42-0400)   Patient had a min HR of 56 bpm, max HR of 156 bpm, and avg HR of 82 bpm.  Predominant underlying rhythm was Sinus Rhythm.  1 run of Supraventricular Tachycardia occurred lasting 4 beats with a max rate of 156 bpm (avg 146 bpm).  Rare PACs and PVCs.  EKG:  EKG is  ordered today.  The ekg ordered today demonstrates NSR 87bpm, nonspecific T wave changes  Recent Labs: 02/09/2021: Hemoglobin 13.1; Platelets 313 10/09/2021: ALT 21; BUN 12;  Creatinine, Ser 0.84; Potassium 3.9; Sodium 143  Recent Lipid Panel    Component Value Date/Time   CHOL 154 10/09/2021 1506   TRIG 129 10/09/2021 1506   HDL 56 10/09/2021 1506   CHOLHDL 2.8 10/09/2021 1506   CHOLHDL 2.7 04/19/2017 1143   LDLCALC 75 10/09/2021 1506   LDLCALC 73 04/19/2017 1143    Physical Exam:    VS:  BP 108/60 (BP Location: Left Arm, Patient Position: Sitting, Cuff Size: Normal)   Pulse 87   Ht 5\' 10"  (1.778 m)   Wt 179 lb 12.8 oz (81.6 kg)   SpO2 96%   BMI 25.80 kg/m     Wt Readings from Last 3 Encounters:  01/17/22 179 lb 12.8 oz (81.6 kg)  10/09/21 186 lb 4.8 oz (84.5 kg)  09/28/21 179 lb 11.2 oz (81.5 kg)     GEN:  Well nourished, well developed in no acute distress HEENT: Normal NECK: No JVD; No carotid bruits LYMPHATICS: No lymphadenopathy CARDIAC: RRR, no murmurs, rubs, gallops RESPIRATORY:  Clear to auscultation without rales, wheezing or rhonchi  ABDOMEN: Soft, non-tender, non-distended MUSCULOSKELETAL:  No edema; No deformity  SKIN: Warm and dry NEUROLOGIC:  Alert and oriented x 3 PSYCHIATRIC:  Normal affect   ASSESSMENT:    1. Hypertension associated with diabetes (HCC)   2. Hyperlipidemia, mixed   3. Tobacco use   4. Bilateral carotid artery stenosis   5. H/O carotid endarterectomy    PLAN:    In order of problems listed above:  HTN BP is good today. Continue Losartan 25mg  daily and amlodipine 10mg  daily.   HLD LDL 75. Continue Lipitor 80mg  daily and Zetia 10mg  daily.   Tobacco use Patient dips every daily, complete cessation advised.   B/l carotid artery stenosis s/p bilateral carotid endarectomy He continues to follow with [facility]. He is on Aspirin and Plavix, I will defer anticoagulation to Vascular surgery. They will re-check an 09/30/21 carotids at a year mark.   Disposition: Follow up in 1 month(s) with MD/APP    Signed, Nathalee Smarr , PA-C  01/17/2022 11:16 AM    [facility]

## 2022-01-17 ENCOUNTER — Ambulatory Visit: Payer: Medicare Other | Admitting: Medical

## 2022-01-17 ENCOUNTER — Encounter: Payer: Self-pay | Admitting: Medical

## 2022-01-17 VITALS — BP 108/60 | HR 87 | Ht 70.0 in | Wt 179.8 lb

## 2022-01-17 DIAGNOSIS — I152 Hypertension secondary to endocrine disorders: Secondary | ICD-10-CM | POA: Diagnosis not present

## 2022-01-17 DIAGNOSIS — E1159 Type 2 diabetes mellitus with other circulatory complications: Secondary | ICD-10-CM | POA: Diagnosis not present

## 2022-01-17 DIAGNOSIS — Z720 Tobacco use: Secondary | ICD-10-CM

## 2022-01-17 DIAGNOSIS — E782 Mixed hyperlipidemia: Secondary | ICD-10-CM | POA: Diagnosis not present

## 2022-01-17 DIAGNOSIS — I6523 Occlusion and stenosis of bilateral carotid arteries: Secondary | ICD-10-CM | POA: Diagnosis not present

## 2022-01-17 DIAGNOSIS — Z9889 Other specified postprocedural states: Secondary | ICD-10-CM

## 2022-01-17 NOTE — Patient Instructions (Signed)

## 2022-01-18 ENCOUNTER — Other Ambulatory Visit: Payer: Self-pay | Admitting: Cardiovascular Disease

## 2022-01-18 ENCOUNTER — Other Ambulatory Visit: Payer: Self-pay | Admitting: *Deleted

## 2022-01-18 DIAGNOSIS — I10 Essential (primary) hypertension: Secondary | ICD-10-CM

## 2022-01-18 MED ORDER — EZETIMIBE 10 MG PO TABS: 10.0000 mg | ORAL_TABLET | Freq: Every day | ORAL | 3 refills | Status: DC

## 2022-02-01 ENCOUNTER — Other Ambulatory Visit: Payer: Self-pay | Admitting: Family Medicine

## 2022-02-01 DIAGNOSIS — E782 Mixed hyperlipidemia: Secondary | ICD-10-CM

## 2022-02-01 DIAGNOSIS — K209 Esophagitis, unspecified without bleeding: Secondary | ICD-10-CM

## 2022-03-01 ENCOUNTER — Telehealth: Payer: Self-pay | Admitting: Family Medicine

## 2022-03-01 NOTE — Telephone Encounter (Signed)
Spoke with patient, he said if his eye does not improve he will schedule an appt.

## 2022-03-01 NOTE — Telephone Encounter (Signed)
Spoke with patient to schedule [facility]VI.    He asked if someone would be able to call him in something for pink eye.  He stated that he thinks he has it.

## 2022-03-14 ENCOUNTER — Ambulatory Visit (INDEPENDENT_AMBULATORY_CARE_PROVIDER_SITE_OTHER): Payer: Medicare Other

## 2022-03-14 VITALS — Wt 179.0 lb

## 2022-03-14 DIAGNOSIS — Z0000 Encounter for general adult medical examination without abnormal findings: Secondary | ICD-10-CM | POA: Diagnosis not present

## 2022-03-14 NOTE — Progress Notes (Signed)
Virtual Visit via Telephone Note  I connected with  Shawn Sherman on 03/14/22 at 10:30 AM EDT by telephone and verified that I am speaking with the correct person using two identifiers.  Location: Patient: home Provider: [facility] Persons participating in the virtual visit: patient/Nurse Health Advisor   I discussed the limitations, risks, security and privacy concerns of performing an evaluation and management service by telephone and the availability of in person appointments. The patient expressed understanding and agreed to proceed.  Interactive audio and video telecommunications were attempted between this nurse and patient, however failed, due to patient having technical difficulties OR patient did not have access to video capability.  We continued and completed visit with audio only.  Some vital signs may be absent or patient reported.   Hal Hope, LPN  Subjective:   Shawn Sherman is a 66 y.o. male who presents for an Initial Medicare Annual Wellness Visit.  Review of Systems     Cardiac Risk Factors include: advanced age (>66men, >28 women);diabetes mellitus;male gender     Objective:    Today's Vitals   03/14/22 1029  PainSc: 5    There is no height or weight on file to calculate BMI.     03/14/2022   10:36 AM 02/24/2021    8:16 AM 02/16/2021   11:35 AM 01/26/2021   12:37 PM 08/22/2020    1:47 PM 07/29/2020    8:42 AM 06/11/2019    7:00 PM  Advanced Directives  Does Patient Have a Medical Advance Directive? No No No No No No No  Would patient like information on creating a medical advance directive? No - Patient declined   No - Patient declined No - Patient declined No - Patient declined No - Patient declined    Current Medications (verified) Outpatient Encounter Medications as of 03/14/2022  Medication Sig   amLODipine (NORVASC) 10 MG tablet TAKE 1 TABLET BY MOUTH ONCE DAILY   aspirin 81 MG chewable tablet Chew 81 mg by mouth daily.   atorvastatin (LIPITOR) 80 MG  tablet TAKE 1 TABLET BY MOUTH ONCE EVERY EVENING. *MAKE APPOINTMENT FOR FURTHER FILLS*   clopidogrel (PLAVIX) 75 MG tablet TAKE 1 TABLET BY MOUTH ONCE DAILY WITH BREAKFAST   diclofenac Sodium (VOLTAREN) 1 % GEL Apply 1 application topically 4 (four) times daily as needed (pain).   esomeprazole (NEXIUM) 40 MG capsule TAKE 1 CAPSULE BY MOUTH ONCE EVERY EVENING   ezetimibe (ZETIA) 10 MG tablet Take 1 tablet (10 mg total) by mouth daily.   gabapentin (NEURONTIN) 100 MG capsule TAKE 1 CAPSULE BY MOUTH 3 TIMES DAILY   losartan (COZAAR) 25 MG tablet Take 0.5 tablets (12.5 mg total) by mouth daily.   Menthol, Topical Analgesic, (ICY HOT EX) Apply 1 application  topically daily as needed (pain).   tadalafil (CIALIS) 5 MG tablet Take 1-4 tablets (5-20 mg total) by mouth daily as needed for erectile dysfunction.   tamsulosin (FLOMAX) 0.4 MG CAPS capsule Take 1 capsule (0.4 mg total) by mouth daily.   Multiple Vitamins-Minerals (AIRBORNE PO) Take 1 tablet by mouth daily. (Patient not taking: Reported on 10/09/2021)   OVER THE COUNTER MEDICATION Take 1 tablet by mouth daily. Tamaflex otc supplement (Patient not taking: Reported on 03/14/2022)   traMADol (ULTRAM) 50 MG tablet Take 1 tablet by mouth every 6 (six) hours as needed. (Patient not taking: Reported on 03/14/2022)   No facility-administered encounter medications on file as of 03/14/2022.    Allergies (verified) Patient has  no known allergies.   History: Past Medical History:  Diagnosis Date   AKI (acute kidney injury) (HCC) 01/26/2021   Anginal pain (HCC) 08/2018   a. 08/2018 - atyp c/p and neg trop; b. 08/2018 MV: EF 57%, inferolateral horizontal ST depression w/o ischemia or infarct.   Arthritis    Carotid arterial disease (HCC)    a. 01/2019 Carotid U/S: RICA 80-90, RCCA <50, RECA>50, LICA 80-99, LCCA>50, LECA>50; b. 01/2019 CT Head/Neck: RICA string sign in bulb - lumen <54mm. Sev prox RECA dzs. LICA 60 @ dist bulb. R vert 50-70, L vert 30-50.    Carotid bruit present 08/2018   left   Chewing tobacco nicotine dependence    Coronary artery disease    Essential hypertension 03/29/2017   GERD (gastroesophageal reflux disease)    Headache    migrraines   History of 2019 novel coronavirus disease (COVID-19) 08/04/2020   History of kidney stones    Hyperlipidemia    Sepsis secondary to UTI (HCC) 01/26/2021   Past Surgical History:  Procedure Laterality Date   ANTERIOR LAT LUMBAR FUSION Right 01/01/2013   Procedure: lumbar three-four extreme lateral interbody fusion;  Surgeon: Reinaldo Meeker, MD;  Location: [facility];  Service: Neurosurgery;  Laterality: Right;  Lumbar three-four extreme lateral interbody fusion     BACK SURGERY  08   COLONOSCOPY     CYSTOSCOPY WITH STENT PLACEMENT Left 01/26/2021   Procedure: CYSTOSCOPY WITH STENT PLACEMENT;  Surgeon: Sondra Come, MD;  Location: [facility];  Service: Urology;  Laterality: Left;   CYSTOSCOPY/URETEROSCOPY/HOLMIUM LASER/STENT PLACEMENT Left 02/24/2021   Procedure: CYSTOSCOPY/URETEROSCOPY/HOLMIUM LASER/STENT EXCHANGE;  Surgeon: Sondra Come, MD;  Location: [facility];  Service: Urology;  Laterality: Left;   ENDARTERECTOMY Right 02/18/2019   Procedure: ENDARTERECTOMY CAROTID;  Surgeon: Annice Needy, MD;  Location: [facility];  Service: Vascular;  Laterality: Right;   ENDARTERECTOMY Left 06/11/2019   Procedure: ENDARTERECTOMY CAROTID;  Surgeon: Annice Needy, MD;  Location: [facility];  Service: Vascular;  Laterality: Left;   ESOPHAGOGASTRODUODENOSCOPY (EGD) WITH PROPOFOL N/A 07/05/2015   Procedure: ESOPHAGOGASTRODUODENOSCOPY (EGD) WITH PROPOFOL;  Surgeon: Midge Minium, MD;  Location: [facility];  Service: Endoscopy;  Laterality: N/A;   EXTRACORPOREAL SHOCK WAVE LITHOTRIPSY Right 09/11/2018   Procedure: EXTRACORPOREAL SHOCK WAVE LITHOTRIPSY (ESWL);  Surgeon: Riki Altes, MD;  Location: [facility];  Service: Urology;  Laterality: Right;   LUMBAR PERCUTANEOUS PEDICLE SCREW 1 LEVEL N/A  01/01/2013   Procedure: lumbar three-four percutaneous pedicle screws ;  Surgeon: Reinaldo Meeker, MD;  Location: [facility];  Service: Neurosurgery;  Laterality: N/A;  lumbar three-four percutaneous pedicle screws   SHOULDER ARTHROSCOPY WITH ROTATOR CUFF REPAIR Left 08/22/2020   Procedure: SHOULDER ARTHROSCOPY WITH ROTATOR CUFF REPAIR AND DEBRIDEMENT;  Surgeon: Lyndle Herrlich, MD;  Location: [facility];  Service: Orthopedics;  Laterality: Left;   Family History  Problem Relation Age of Onset   Cancer Mother    Cancer Father    COPD Sister    Social History   Socioeconomic History   Marital status: Married    Spouse name: Joyce Gross   Number of children: Not on file   Years of education: Not on file   Highest education level: Not on file  Occupational History   Not on file  Tobacco Use   Smoking status: Never   Smokeless tobacco: Current    Types: Chew  Vaping Use   Vaping Use: Never used  Substance and Sexual Activity  Alcohol use: No   Drug use: No   Sexual activity: Yes    Birth control/protection: None  Other Topics Concern   Not on file  Social History Narrative   Not on file   Social Determinants of Health   Financial Resource Strain: Low Risk  (03/14/2022)   Overall Financial Resource Strain (CARDIA)    Difficulty of Paying Living Expenses: Not hard at all  Food Insecurity: No Food Insecurity (03/14/2022)   Hunger Vital Sign    Worried About Running Out of Food in the Last Year: Never true    Ran Out of Food in the Last Year: Never true  Transportation Needs: No Transportation Needs (03/14/2022)   PRAPARE - Administrator, Civil Service (Medical): No    Lack of Transportation (Non-Medical): No  Physical Activity: Insufficiently Active (03/14/2022)   Exercise Vital Sign    Days of Exercise per Week: 3 days    Minutes of Exercise per Session: 30 min  Stress: No Stress Concern Present (03/14/2022)   [employer] of Occupational Health - Occupational Stress  Questionnaire    Feeling of Stress : Not at all  Social Connections: Moderately Isolated (03/14/2022)   Social Connection and Isolation Panel [NHANES]    Frequency of Communication with Friends and Family: More than three times a week    Frequency of Social Gatherings with Friends and Family: Once a week    Attends Religious Services: Never    Database administrator or Organizations: No    Attends Engineer, structural: Never    Marital Status: Married    Tobacco Counseling Ready to quit: Not Answered Counseling given: Not Answered   Clinical Intake:  Pre-visit preparation completed: Yes  Pain : 0-10 Pain Score: 5  Pain Location: Shoulder Pain Orientation: Left Pain Descriptors / Indicators: Nagging, Dull, Aching Pain Onset: More than a month ago     Nutritional Risks: None Diabetes: Yes CBG done?: No Did pt. bring in CBG monitor from home?: No  How often do you need to have someone help you when you read instructions, pamphlets, or other written materials from your doctor or pharmacy?: 1 - Never  Diabetic?yes Nutrition Risk Assessment:  Has the patient had any N/V/D within the last 2 months?  Yes  Does the patient have any non-healing wounds?  No  Has the patient had any unintentional weight loss or weight gain?  No   Diabetes:  Is the patient diabetic?  Yes  If diabetic, was a CBG obtained today?  No  Did the patient bring in their glucometer from home?  No  How often do you monitor your CBG's? never.   Financial Strains and Diabetes Management:  Are you having any financial strains with the device, your supplies or your medication? No .  Does the patient want to be seen by Chronic Care Management for management of their diabetes?  No  Would the patient like to be referred to a Nutritionist or for Diabetic Management?  No   Diabetic Exams:  Diabetic Eye Exam: Completed no. Overdue for diabetic eye exam. Pt has been advised about the importance in  completing this exam.  Diabetic Foot Exam: Completed 10/09/21. Pt has been advised about the importance in completing this exam.   Interpreter Needed?: No  Information entered by :: Kennedy Bucker, LPN   Activities of Daily Living    03/14/2022   10:37 AM 10/09/2021    2:31 PM  In your present  state of health, do you have any difficulty performing the following activities:  Hearing? 0 0  Vision? 0 0  Difficulty concentrating or making decisions? 0 0  Walking or climbing stairs? 1 1  Dressing or bathing? 0 0  Doing errands, shopping? 0 0  Preparing Food and eating ? N   Using the Toilet? N   In the past six months, have you accidently leaked urine? N   Do you have problems with loss of bowel control? N   Managing your Medications? N   Managing your Finances? N   Housekeeping or managing your Housekeeping? N     Patient Care Team: Erasmo Downer, MD as PCP - General (Family Medicine) Iran Ouch, MD as PCP - Cardiology (Cardiology)  Indicate any recent Medical Services you may have received from other than [facility] providers in the past year (date may be approximate).     Assessment:   This is a routine wellness examination for Shawn Sherman.  Hearing/Vision screen Hearing Screening - Comments:: No aids Vision Screening - Comments:: Wears glasses- [facility]  Dietary issues and exercise activities discussed: Current Exercise Habits: Home exercise routine, Type of exercise: walking, Time (Minutes): 30, Frequency (Times/Week): 3, Weekly Exercise (Minutes/Week): 90, Intensity: Mild   Goals Addressed             This Visit's Progress    DIET - EAT MORE FRUITS AND VEGETABLES         Depression Screen    03/14/2022   10:34 AM 03/14/2022   10:33 AM 10/09/2021    2:31 PM 02/09/2021   11:14 AM 12/31/2019   11:36 AM 03/29/2017   11:01 AM  PHQ 2/9 Scores  PHQ - 2 Score 0 0 2 0 0 2  PHQ- 9 Score 0 0 7   7    Fall Risk    03/14/2022   10:37 AM 10/09/2021    2:32 PM 12/31/2019    11:36 AM  Fall Risk   Falls in the past year? 0 1 0  Number falls in past yr: 0 0 0  Injury with Fall? 0 0 0  Risk for fall due to : No Fall Risks History of fall(s) No Fall Risks  Follow up Falls evaluation completed Falls evaluation completed;Education provided;Falls prevention discussed Falls evaluation completed    FALL RISK PREVENTION PERTAINING TO THE HOME:  Any stairs in or around the home? No  If so, are there any without handrails? No  Home free of loose throw rugs in walkways, pet beds, electrical cords, etc? Yes  Adequate lighting in your home to reduce risk of falls? Yes   ASSISTIVE DEVICES UTILIZED TO PREVENT FALLS:  Life alert? No  Use of a cane, walker or w/c? No  Grab bars in the bathroom? No  Shower chair or bench in shower? No  Elevated toilet seat or a handicapped toilet? No   Cognitive Function:        03/14/2022   10:39 AM  6CIT Screen  What Year? 0 points  What month? 0 points  What time? 0 points  Count back from 20 0 points  Months in reverse 0 points  Repeat phrase 0 points  Total Score 0 points    Immunizations Immunization History  Administered Date(s) Administered   PFIZER(Purple Top)SARS-COV-2 Vaccination 02/06/2020, 03/05/2020   PNEUMOCOCCAL CONJUGATE-20 10/09/2021   Td 06/25/2011   Tdap 06/25/2011    TDAP status: Due, Education has been provided regarding the importance of  this vaccine. Advised may receive this vaccine at local pharmacy or Health Dept. Aware to provide a copy of the vaccination record if obtained from local pharmacy or Health Dept. Verbalized acceptance and understanding.  Flu Vaccine status: Declined, Education has been provided regarding the importance of this vaccine but patient still declined. Advised may receive this vaccine at local pharmacy or Health Dept. Aware to provide a copy of the vaccination record if obtained from local pharmacy or Health Dept. Verbalized acceptance and understanding.  Pneumococcal  vaccine status: Due, Education has been provided regarding the importance of this vaccine. Advised may receive this vaccine at local pharmacy or Health Dept. Aware to provide a copy of the vaccination record if obtained from local pharmacy or Health Dept. Verbalized acceptance and understanding.  Covid-19 vaccine status: Completed vaccines  Qualifies for Shingles Vaccine? Yes   Zostavax completed No   Shingrix Completed?: No.    Education has been provided regarding the importance of this vaccine. Patient has been advised to call insurance company to determine out of pocket expense if they have not yet received this vaccine. Advised may also receive vaccine at local pharmacy or Health Dept. Verbalized acceptance and understanding.  Screening Tests Health Maintenance  Topic Date Due   OPHTHALMOLOGY EXAM  Never done   Zoster Vaccines- Shingrix (1 of 2) Never done   COLONOSCOPY (Pts [id number] Insurance coverage will need to be confirmed)  03/30/2018   COVID-19 Vaccine (3 - Pfizer risk series) 04/02/2020   TETANUS/TDAP  06/24/2021   INFLUENZA VACCINE  Never done   HEMOGLOBIN A1C  04/10/2022   Diabetic kidney evaluation - GFR measurement  10/10/2022   Diabetic kidney evaluation - Urine ACR  10/10/2022   FOOT EXAM  10/10/2022   Pneumonia Vaccine 1865+ Years old  Completed   Hepatitis C Screening  Completed   HPV VACCINES  Aged Out    Health Maintenance  Health Maintenance Due  Topic Date Due   OPHTHALMOLOGY EXAM  Never done   Zoster Vaccines- Shingrix (1 of 2) Never done   COLONOSCOPY (Pts [id number] Insurance coverage will need to be confirmed)  03/30/2018   COVID-19 Vaccine (3 - Pfizer risk series) 04/02/2020   TETANUS/TDAP  06/24/2021   INFLUENZA VACCINE  Never done    Colorectal cancer screening: Referral to GI placed today. Pt aware the office will call re: appt.  Lung Cancer Screening: (Low Dose CT Chest recommended if Age 15-80 years, 30 pack-year currently smoking OR have quit  w/in 15years.) does not qualify.   Additional Screening:  Hepatitis C Screening: does qualify; Completed 10/02/13  Vision Screening: Recommended annual ophthalmology exams for early detection of glaucoma and other disorders of the eye. Is the patient up to date with their annual eye exam?  Yes  Who is the provider or what is the name of the office in which the patient attends annual eye exams? [facility] If pt is not established with a provider, would they like to be referred to a provider to establish care? No .   Dental Screening: Recommended annual dental exams for proper oral hygiene  Community Resource Referral / Chronic Care Management: CRR required this visit?  No   CCM required this visit?  No      Plan:     I have personally reviewed and noted the following in the patient's chart:   Medical and social history Use of alcohol, tobacco or illicit drugs  Current medications and supplements including opioid prescriptions. Patient  is not currently taking opioid prescriptions. Functional ability and status Nutritional status Physical activity Advanced directives List of other physicians Hospitalizations, surgeries, and ER visits in previous 12 months Vitals Screenings to include cognitive, depression, and falls Referrals and appointments  In addition, I have reviewed and discussed with patient certain preventive protocols, quality metrics, and best practice recommendations. A written personalized care plan for preventive services as well as general preventive health recommendations were provided to patient.     Hal Hope, LPN   [id number]/02/[id number]   Nurse Notes: none

## 2022-03-14 NOTE — Patient Instructions (Signed)
Shawn Sherman , Thank you for taking time to come for your Medicare Wellness Visit. I appreciate your ongoing commitment to your health goals. Please review the following plan we discussed and let me know if I can assist you in the future.   Screening recommendations/referrals: Colonoscopy: referral sent Recommended yearly ophthalmology/optometry visit for glaucoma screening and checkup Recommended yearly dental visit for hygiene and checkup  Vaccinations: Influenza vaccine: n/d Pneumococcal vaccine: 10/09/21 Tdap vaccine: 06/25/11, due if have injury Shingles vaccine: n/d   Covid-19: 02/06/20, 03/05/20  Advanced directives: no  Conditions/risks identified: none  Next appointment: Follow up in one year for your annual wellness visit. 03/18/23 @ 2pm by phone  Preventive Care 65 Years and Older, Male Preventive care refers to lifestyle choices and visits with your health care provider that can promote health and wellness. What does preventive care include? A yearly physical exam. This is also called an annual well check. Dental exams once or twice a year. Routine eye exams. Ask your health care provider how often you should have your eyes checked. Personal lifestyle choices, including: Daily care of your teeth and gums. Regular physical activity. Eating a healthy diet. Avoiding tobacco and drug use. Limiting alcohol use. Practicing safe sex. Taking low doses of aspirin every day. Taking vitamin and mineral supplements as recommended by your health care provider. What happens during an annual well check? The services and screenings done by your health care provider during your annual well check will depend on your age, overall health, lifestyle risk factors, and family history of disease. Counseling  Your health care provider may ask you questions about your: Alcohol use. Tobacco use. Drug use. Emotional well-being. Home and relationship well-being. Sexual activity. Eating  habits. History of falls. Memory and ability to understand (cognition). Work and work Astronomer. Screening  You may have the following tests or measurements: Height, weight, and BMI. Blood pressure. Lipid and cholesterol levels. These may be checked every 5 years, or more frequently if you are over 76 years old. Skin check. Lung cancer screening. You may have this screening every year starting at age 13 if you have a 30-pack-year history of smoking and currently smoke or have quit within the past 15 years. Fecal occult blood test (FOBT) of the stool. You may have this test every year starting at age 39. Flexible sigmoidoscopy or colonoscopy. You may have a sigmoidoscopy every 5 years or a colonoscopy every 10 years starting at age 82. Prostate cancer screening. Recommendations will vary depending on your family history and other risks. Hepatitis C blood test. Hepatitis B blood test. Sexually transmitted disease (STD) testing. Diabetes screening. This is done by checking your blood sugar (glucose) after you have not eaten for a while (fasting). You may have this done every 1-3 years. Abdominal aortic aneurysm (AAA) screening. You may need this if you are a current or former smoker. Osteoporosis. You may be screened starting at age 13 if you are at high risk. Talk with your health care provider about your test results, treatment options, and if necessary, the need for more tests. Vaccines  Your health care provider may recommend certain vaccines, such as: Influenza vaccine. This is recommended every year. Tetanus, diphtheria, and acellular pertussis (Tdap, Td) vaccine. You may need a Td booster every 10 years. Zoster vaccine. You may need this after age 29. Pneumococcal 13-valent conjugate (PCV13) vaccine. One dose is recommended after age 66. Pneumococcal polysaccharide (PPSV23) vaccine. One dose is recommended after age 43. Talk to  your health care provider about which screenings and  vaccines you need and how often you need them. This information is not intended to replace advice given to you by your health care provider. Make sure you discuss any questions you have with your health care provider. Document Released: 07/22/2015 Document Revised: 03/14/2016 Document Reviewed: 04/26/2015 Elsevier Interactive Patient Education  2017 [place] Prevention in the Home Falls can cause injuries. They can happen to people of all ages. There are many things you can do to make your home safe and to help prevent falls. What can I do on the outside of my home? Regularly fix the edges of walkways and driveways and fix any cracks. Remove anything that might make you trip as you walk through a door, such as a raised step or threshold. Trim any bushes or trees on the path to your home. Use bright outdoor lighting. Clear any walking paths of anything that might make someone trip, such as rocks or tools. Regularly check to see if handrails are loose or broken. Make sure that both sides of any steps have handrails. Any raised decks and porches should have guardrails on the edges. Have any leaves, snow, or ice cleared regularly. Use sand or salt on walking paths during winter. Clean up any spills in your garage right away. This includes oil or grease spills. What can I do in the bathroom? Use night lights. Install grab bars by the toilet and in the tub and shower. Do not use towel bars as grab bars. Use non-skid mats or decals in the tub or shower. If you need to sit down in the shower, use a plastic, non-slip stool. Keep the floor dry. Clean up any water that spills on the floor as soon as it happens. Remove soap buildup in the tub or shower regularly. Attach bath mats securely with double-sided non-slip rug tape. Do not have throw rugs and other things on the floor that can make you trip. What can I do in the bedroom? Use night lights. Make sure that you have a light by your  bed that is easy to reach. Do not use any sheets or blankets that are too big for your bed. They should not hang down onto the floor. Have a firm chair that has side arms. You can use this for support while you get dressed. Do not have throw rugs and other things on the floor that can make you trip. What can I do in the kitchen? Clean up any spills right away. Avoid walking on wet floors. Keep items that you use a lot in easy-to-reach places. If you need to reach something above you, use a strong step stool that has a grab bar. Keep electrical cords out of the way. Do not use floor polish or wax that makes floors slippery. If you must use wax, use non-skid floor wax. Do not have throw rugs and other things on the floor that can make you trip. What can I do with my stairs? Do not leave any items on the stairs. Make sure that there are handrails on both sides of the stairs and use them. Fix handrails that are broken or loose. Make sure that handrails are as long as the stairways. Check any carpeting to make sure that it is firmly attached to the stairs. Fix any carpet that is loose or worn. Avoid having throw rugs at the top or bottom of the stairs. If you do have throw rugs, attach  them to the floor with carpet tape. Make sure that you have a light switch at the top of the stairs and the bottom of the stairs. If you do not have them, ask someone to add them for you. What else can I do to help prevent falls? Wear shoes that: Do not have high heels. Have rubber bottoms. Are comfortable and fit you well. Are closed at the toe. Do not wear sandals. If you use a stepladder: Make sure that it is fully opened. Do not climb a closed stepladder. Make sure that both sides of the stepladder are locked into place. Ask someone to hold it for you, if possible. Clearly mark and make sure that you can see: Any grab bars or handrails. First and last steps. Where the edge of each step is. Use tools that  help you move around (mobility aids) if they are needed. These include: Canes. Walkers. Scooters. Crutches. Turn on the lights when you go into a dark area. Replace any light bulbs as soon as they burn out. Set up your furniture so you have a clear path. Avoid moving your furniture around. If any of your floors are uneven, fix them. If there are any pets around you, be aware of where they are. Review your medicines with your doctor. Some medicines can make you feel dizzy. This can increase your chance of falling. Ask your doctor what other things that you can do to help prevent falls. This information is not intended to replace advice given to you by your health care provider. Make sure you discuss any questions you have with your health care provider. Document Released: 04/21/2009 Document Revised: 12/01/2015 Document Reviewed: 07/30/2014 Elsevier Interactive Patient Education  2017 [employer].

## 2022-04-02 ENCOUNTER — Other Ambulatory Visit (INDEPENDENT_AMBULATORY_CARE_PROVIDER_SITE_OTHER): Payer: Self-pay | Admitting: Vascular Surgery

## 2022-04-02 DIAGNOSIS — I6523 Occlusion and stenosis of bilateral carotid arteries: Secondary | ICD-10-CM

## 2022-04-03 ENCOUNTER — Ambulatory Visit (INDEPENDENT_AMBULATORY_CARE_PROVIDER_SITE_OTHER): Payer: Medicare Other | Admitting: Vascular Surgery

## 2022-04-03 ENCOUNTER — Encounter (INDEPENDENT_AMBULATORY_CARE_PROVIDER_SITE_OTHER): Payer: Medicare Other

## 2022-04-30 ENCOUNTER — Other Ambulatory Visit: Payer: Self-pay | Admitting: Family Medicine

## 2022-04-30 DIAGNOSIS — K209 Esophagitis, unspecified without bleeding: Secondary | ICD-10-CM

## 2022-04-30 DIAGNOSIS — E782 Mixed hyperlipidemia: Secondary | ICD-10-CM

## 2022-04-30 DIAGNOSIS — M7061 Trochanteric bursitis, right hip: Secondary | ICD-10-CM

## 2022-05-07 ENCOUNTER — Encounter (INDEPENDENT_AMBULATORY_CARE_PROVIDER_SITE_OTHER): Payer: Self-pay

## 2022-06-14 ENCOUNTER — Other Ambulatory Visit: Payer: Self-pay | Admitting: Urology

## 2022-08-04 ENCOUNTER — Other Ambulatory Visit: Payer: Self-pay | Admitting: Family Medicine

## 2022-08-04 DIAGNOSIS — K209 Esophagitis, unspecified without bleeding: Secondary | ICD-10-CM

## 2022-08-04 DIAGNOSIS — E782 Mixed hyperlipidemia: Secondary | ICD-10-CM

## 2022-08-06 ENCOUNTER — Other Ambulatory Visit: Payer: Self-pay

## 2022-08-06 NOTE — Telephone Encounter (Signed)
Requested Prescriptions  Pending Prescriptions Disp Refills   esomeprazole (NEXIUM) 40 MG capsule [Pharmacy Med Name: ESOMEPRAZOLE MAGNESIUM 40 MG CAP] 90 capsule 0    Sig: TAKE 1 CAPSULE BY MOUTH ONCE EVERY EVENING     Gastroenterology: Proton Pump Inhibitors 2 Passed - 08/04/2022  3:57 PM      Passed - ALT in normal range and within 360 days    ALT  Date Value Ref Range Status  10/09/2021 21 0 - 44 IU/L Final         Passed - AST in normal range and within 360 days    AST  Date Value Ref Range Status  10/09/2021 19 0 - 40 IU/L Final         Passed - Valid encounter within last 12 months    Recent Outpatient Visits           10 months ago Type 2 diabetes mellitus with other specified complication, without long-term current use of insulin ([place])   [place], Dionne Bucy, MD   1 year ago Urinary tract obstruction due to kidney stone   [place], PA-C   2 years ago Right hip pain   [place], Vickki Muff, [place]   2 years ago Essential hypertension   [place], Dionne Bucy, MD   4 years ago Right flank pain   [place], Dionne Bucy, MD               atorvastatin (LIPITOR) 80 MG tablet [Pharmacy Med Name: ATORVASTATIN CALCIUM 80 MG TAB] 30 tablet 3    Sig: TAKE 1 TABLET BY MOUTH ONCE EVERY EVENING. *MAKE APPOINTMENT FOR FURTHER FILLS*     Cardiovascular:  Antilipid - Statins Failed - 08/04/2022  3:57 PM      Failed - Lipid Panel in normal range within the last 12 months    Cholesterol, Total  Date Value Ref Range Status  10/09/2021 154 100 - 199 mg/dL Final   LDL Cholesterol (Calc)  Date Value Ref Range Status  04/19/2017 73 mg/dL (calc) Final    Comment:    Reference range: <100 . Desirable range <100 mg/dL for primary prevention;   <70 mg/dL for patients with CHD or diabetic  patients  with > or = 2 CHD risk factors. Marland Kitchen LDL-C is now calculated using the Martin-Hopkins  calculation, which is a validated novel method providing  better accuracy than the Friedewald equation in the  estimation of LDL-C.  Cresenciano Genre et al. Annamaria Helling. 9562;130(86): 2061-2068  (http://education.[facility].com/faq/FAQ164)    LDL Chol Calc (NIH)  Date Value Ref Range Status  10/09/2021 75 0 - 99 mg/dL Final   HDL  Date Value Ref Range Status  10/09/2021 56 >39 mg/dL Final   Triglycerides  Date Value Ref Range Status  10/09/2021 129 0 - 149 mg/dL Final         Passed - Patient is not pregnant      Passed - Valid encounter within last 12 months    Recent Outpatient Visits           10 months ago Type 2 diabetes mellitus with other specified complication, without long-term current use of insulin [facility])   [place], Dionne Bucy, MD   1 year ago Urinary tract obstruction due to kidney stone   [place],  Vickki Muff, PA-C   2 years ago Right hip pain   [place], Vickki Muff, [place]   2 years ago Essential hypertension   [place], Dionne Bucy, MD   4 years ago Right flank pain   [facility] [place], Dionne Bucy, MD

## 2022-11-12 ENCOUNTER — Other Ambulatory Visit (INDEPENDENT_AMBULATORY_CARE_PROVIDER_SITE_OTHER): Payer: Self-pay | Admitting: Nurse Practitioner

## 2022-11-12 ENCOUNTER — Other Ambulatory Visit: Payer: Self-pay | Admitting: Family Medicine

## 2022-11-12 DIAGNOSIS — K209 Esophagitis, unspecified without bleeding: Secondary | ICD-10-CM

## 2022-11-12 NOTE — Telephone Encounter (Signed)
Patient needs to call and make a follow up appt as soon as possible

## 2022-12-07 ENCOUNTER — Other Ambulatory Visit: Payer: Self-pay | Admitting: Cardiovascular Disease

## 2022-12-07 DIAGNOSIS — I10 Essential (primary) hypertension: Secondary | ICD-10-CM

## 2023-01-07 ENCOUNTER — Other Ambulatory Visit: Payer: Self-pay | Admitting: Cardiovascular Disease

## 2023-01-07 ENCOUNTER — Other Ambulatory Visit: Payer: Self-pay | Admitting: Family Medicine

## 2023-01-07 DIAGNOSIS — I10 Essential (primary) hypertension: Secondary | ICD-10-CM

## 2023-01-07 DIAGNOSIS — M7061 Trochanteric bursitis, right hip: Secondary | ICD-10-CM

## 2023-01-07 DIAGNOSIS — E782 Mixed hyperlipidemia: Secondary | ICD-10-CM

## 2023-01-07 NOTE — Telephone Encounter (Signed)
Please schedule F/U appointment for 90 day refills. Thank you! 

## 2023-01-08 NOTE — Telephone Encounter (Signed)
Called pt and appt made for end of July - given pt 30 day courtesy refil. [facility] [address] [facility].  [facility] and [facility] this location per pt's request. Spoke to [facility] Called pt back to let him know that it could be a 24-48 time frame until [facility].   Requested Prescriptions  Pending Prescriptions Disp Refills   gabapentin (NEURONTIN) 100 MG capsule [Pharmacy Med Name: GABAPENTIN 100 MG CAP] 90 capsule 0    Sig: TAKE 1 CAPSULE BY MOUTH 3 TIMES DAILY     Neurology: Anticonvulsants - gabapentin Failed - 01/07/2023  1:56 PM      Failed - Cr in normal range and within 360 days    Creatinine, Ser  Date Value Ref Range Status  10/09/2021 0.84 0.76 - 1.27 mg/dL Final         Failed - Valid encounter within last 12 months    Recent Outpatient Visits           1 year ago Type 2 diabetes mellitus with other specified complication, without long-term current use of insulin ([facility])   [place] [place], Marzella Schlein, MD   1 year ago Urinary tract obstruction due to kidney stone   [facility], Jodell Cipro, PA-C   2 years ago Right hip pain   [place] [facility], Jodell Cipro, PA-C   3 years ago Essential hypertension   [place] [facility] [place], Marzella Schlein, MD   4 years ago Right flank pain   [place] [facility] [place], Marzella Schlein, MD       Future Appointments             In 3 weeks [place], Marzella Schlein, MD [facility]            Passed - Completed PHQ-2 or PHQ-9 in the last 360 days       atorvastatin (LIPITOR) 80 MG tablet [Pharmacy Med Name: ATORVASTATIN CALCIUM 80 MG TAB] 30 tablet 0    Sig: TAKE 1 TABLET BY MOUTH ONCE EVERY EVENING. *MAKE APPOINTMENT FOR FURTHER FILLS*     Cardiovascular:  Antilipid - Statins Failed - 01/07/2023  1:56 PM      Failed - Valid  encounter within last 12 months    Recent Outpatient Visits           1 year ago Type 2 diabetes mellitus with other specified complication, without long-term current use of insulin [facility])   [place] [facility] [place], Marzella Schlein, MD   1 year ago Urinary tract obstruction due to kidney stone   [place] [facility] [place], Jodell Cipro, PA-C   2 years ago Right hip pain   [place] [facility] [place], Jodell Cipro, [place]   3 years ago Essential hypertension   [place] [facility] [place], Marzella Schlein, MD   4 years ago Right flank pain   [place] [facility] [place], Marzella Schlein, MD       Future Appointments             In 3 weeks Bacigalupo, Marzella Schlein, MD [facility]            Failed - Lipid Panel in normal range within the last 12 months    Cholesterol, Total  Date Value Ref Range Status  10/09/2021 154 100 - 199 mg/dL Final   LDL Cholesterol (Calc)  Date Value Ref Range Status  04/19/2017 73 mg/dL (calc) Final    Comment:    Reference range: <100 . Desirable range <100 mg/dL for primary prevention;   <70 mg/dL for patients with CHD or diabetic patients  with > or = 2 CHD risk factors. Marland Kitchen LDL-C is now calculated using the Martin-Hopkins  calculation, which is a validated novel method providing  better accuracy than the Friedewald equation in the  estimation of LDL-C.  Horald Pollen et al. Lenox Ahr. 1610;960(45): 2061-2068  (http://education.[facility].com/faq/FAQ164)    LDL Chol Calc (NIH)  Date Value Ref Range Status  10/09/2021 75 0 - 99 mg/dL Final   HDL  Date Value Ref Range Status  10/09/2021 56 >39 mg/dL Final   Triglycerides  Date Value Ref Range Status  10/09/2021 129 0 - 149 mg/dL Final         Passed - Patient is not pregnant

## 2023-01-09 ENCOUNTER — Other Ambulatory Visit: Payer: Self-pay

## 2023-01-09 ENCOUNTER — Other Ambulatory Visit: Payer: Self-pay | Admitting: Family Medicine

## 2023-01-09 DIAGNOSIS — E782 Mixed hyperlipidemia: Secondary | ICD-10-CM

## 2023-01-09 DIAGNOSIS — M7061 Trochanteric bursitis, right hip: Secondary | ICD-10-CM

## 2023-01-09 MED ORDER — GABAPENTIN 100 MG PO CAPS: 100.0000 mg | ORAL_CAPSULE | Freq: Three times a day (TID) | ORAL | 0 refills | Status: DC

## 2023-01-09 MED ORDER — ATORVASTATIN CALCIUM 80 MG PO TABS: ORAL_TABLET | ORAL | 0 refills | Status: DC

## 2023-01-29 ENCOUNTER — Ambulatory Visit (INDEPENDENT_AMBULATORY_CARE_PROVIDER_SITE_OTHER): Payer: Medicare HMO | Admitting: Family Medicine

## 2023-01-29 ENCOUNTER — Encounter: Payer: Self-pay | Admitting: Family Medicine

## 2023-01-29 ENCOUNTER — Ambulatory Visit
Admission: RE | Admit: 2023-01-29 | Discharge: 2023-01-29 | Disposition: A | Discharge status: Home / Self Care | Payer: Medicare HMO | Attending: Family Medicine | Admitting: Family Medicine

## 2023-01-29 ENCOUNTER — Ambulatory Visit: Admission: RE | Admit: 2023-01-29 | Payer: Medicare HMO | Source: Ambulatory Visit

## 2023-01-29 VITALS — BP 138/81 | HR 109 | Wt 182.5 lb

## 2023-01-29 DIAGNOSIS — E785 Hyperlipidemia, unspecified: Secondary | ICD-10-CM | POA: Diagnosis not present

## 2023-01-29 DIAGNOSIS — I152 Hypertension secondary to endocrine disorders: Secondary | ICD-10-CM

## 2023-01-29 DIAGNOSIS — M25551 Pain in right hip: Secondary | ICD-10-CM

## 2023-01-29 DIAGNOSIS — E1159 Type 2 diabetes mellitus with other circulatory complications: Secondary | ICD-10-CM | POA: Diagnosis not present

## 2023-01-29 DIAGNOSIS — E1169 Type 2 diabetes mellitus with other specified complication: Secondary | ICD-10-CM

## 2023-01-29 DIAGNOSIS — M7061 Trochanteric bursitis, right hip: Secondary | ICD-10-CM

## 2023-01-29 DIAGNOSIS — M160 Bilateral primary osteoarthritis of hip: Secondary | ICD-10-CM | POA: Diagnosis not present

## 2023-01-29 MED ORDER — GABAPENTIN 300 MG PO CAPS
300.0000 mg | ORAL_CAPSULE | Freq: Three times a day (TID) | ORAL | 1 refills | Status: DC
Start: 2023-01-29 — End: 2023-08-12

## 2023-01-29 NOTE — Progress Notes (Signed)
Established Patient Office Visit  Subjective   Patient ID: Shawn Sherman, male    DOB: 04/30/1956  Age: 67 y.o. MRN: [id number]  Chief Complaint  Patient presents with   Medical Management of Chronic Issues    Brack is here today for medical management of his chronic conditions.  He believes his blood pressure management is going well. He denies symptoms of high or low BP. He has been on the same dose of blood pressure medicine for years.  Lifestyle modifications are going well. His son is moving and remodeling so he has been helping with the manual labor aspect of that.  His main complaint is chronic hip pain. He takes aleve and gabapentin for it. He previously had hip injections and he said those only help for 3 weeks. He is requesting hydrocodone for the pain. He is followed by cardiology. He mentioned an upcoming appointment for fluttering / palpitations. He feels as though something is crawling across his chest. He feels it more in the RUQ than the location of his heart.   Patient Active Problem List   Diagnosis Date Noted   Urinary tract obstruction due to kidney stone 01/27/2021   Hydronephrosis with infection 01/26/2021   History of arthroscopic procedure on shoulder 08/23/2020   Full thickness rotator cuff tear 05/31/2020   T2DM (type 2 diabetes mellitus) (HCC) 12/31/2019   Greater trochanteric bursitis of right hip 12/31/2019   Carotid stenosis, left 06/11/2019   Carotid stenosis, right 02/18/2019   Carotid stenosis 01/30/2019   Nephrolithiasis 12/29/2018   Hypertension associated with diabetes (HCC) 03/29/2017   History of hay fever 06/17/2015   Acid reflux 06/17/2015   Hyperlipidemia associated with type 2 diabetes mellitus (HCC) 06/17/2015      Review of Systems  Eyes:  Negative for blurred vision.  Cardiovascular:  Positive for palpitations.  Gastrointestinal:  Positive for abdominal pain, heartburn and nausea.  Musculoskeletal:  Positive for joint pain and  myalgias.  Neurological:  Negative for dizziness and headaches.     Objective:     BP 138/81 (BP Location: Right Arm, Patient Position: Sitting, Cuff Size: Normal)   Pulse (!) 109   Wt 182 lb 8 oz (82.8 kg)   SpO2 98%   BMI 26.19 kg/m  BP Readings from Last 3 Encounters:  01/29/23 138/81  01/17/22 108/60  10/09/21 118/75   Wt Readings from Last 3 Encounters:  01/29/23 182 lb 8 oz (82.8 kg)  03/14/22 179 lb (81.2 kg)  01/17/22 179 lb 12.8 oz (81.6 kg)    Physical Exam Constitutional:      Appearance: Normal appearance.  Cardiovascular:     Rate and Rhythm: Normal rate and regular rhythm.     Heart sounds: Normal heart sounds.  Pulmonary:     Effort: Pulmonary effort is normal.     Breath sounds: Normal breath sounds.  Musculoskeletal:     Right hip: Tenderness and bony tenderness present. Decreased range of motion.     Left hip: Normal.  Neurological:     Mental Status: He is alert.     No results found for any visits on 01/29/23.  Last metabolic panel Lab Results  Component Value Date   GLUCOSE 156 (H) 10/09/2021   NA 143 10/09/2021   K 3.9 10/09/2021   CL 105 10/09/2021   CO2 23 10/09/2021   BUN 12 10/09/2021   CREATININE 0.84 10/09/2021   EGFR 97 10/09/2021   CALCIUM 9.4 10/09/2021   PROT 6.3 10/09/2021  ALBUMIN 4.6 10/09/2021   LABGLOB 1.7 10/09/2021   AGRATIO 2.7 (H) 10/09/2021   BILITOT 0.5 10/09/2021   ALKPHOS 121 10/09/2021   AST 19 10/09/2021   ALT 21 10/09/2021   ANIONGAP 8 01/27/2021   Last lipids Lab Results  Component Value Date   CHOL 154 10/09/2021   HDL 56 10/09/2021   LDLCALC 75 10/09/2021   TRIG 129 10/09/2021   CHOLHDL 2.8 10/09/2021   Last hemoglobin A1c Lab Results  Component Value Date   HGBA1C 6.6 (A) 10/09/2021      The ASCVD Risk score (Arnett DK, et al., 2019) failed to calculate for the following reasons:   The systolic blood pressure is missing    Assessment & Plan:   Problem List Items Addressed This  Visit       Cardiovascular and Mediastinum   Hypertension associated with diabetes (HCC)    Chronic and well controlled.  Continue current management       Relevant Orders   Comprehensive metabolic panel     Endocrine   Hyperlipidemia associated with type 2 diabetes mellitus (HCC)    Well controlled. Most recent lipid panel was wnl.  Recheck lipid panel  Continue current meds Encourage lifestyle modifications      Relevant Orders   Lipid panel   Comprehensive metabolic panel   X5MW (type 2 diabetes mellitus) (HCC) - Primary    Well controlled. Was new diagnosis a year ago. Has been trying to keep active as part of lifestyle modifications.  Recheck A1c  Continue lifestyle modifications  Consider medication initiation pending A1c result      Relevant Orders   Hemoglobin A1c   Urine Microalbumin w/creat. ratio     Musculoskeletal and Integument   Greater trochanteric bursitis of right hip    Chronic. Has had increasing pain over the past couple of years. Has previously had injections but they did not help. Gabapentin seems to be helping some.  Increase Gabapentin to 300 mg TID  Referral to orthopedics      Relevant Medications   gabapentin (NEURONTIN) 300 MG capsule   Other Relevant Orders   Ambulatory referral to Orthopedics     Other   Right hip pain    Chronic. Pain is located along R groin. Range of motion of this hip joint is limited. This presentation is c/f arthritis.  XR hip  Referral to orthopedics      Relevant Orders   DG Hip Unilat W OR W/O Pelvis 2-3 Views Right   Ambulatory referral to Orthopedics    Return in about 6 months (around 08/01/2023) for CPE, AWV.    Rometta Emery, Medical Student   Patient seen along with MS3 student Jodi Marble. I personally evaluated this patient along with the student, and verified all aspects of the history, physical exam, and medical decision making as documented by the student. I agree with the  student's documentation and have made all necessary edits.  Tegan Burnside, Marzella Schlein, MD, MPH [facility]

## 2023-01-29 NOTE — Assessment & Plan Note (Signed)
Well controlled. Most recent lipid panel was wnl.  Recheck lipid panel  Continue current meds Encourage lifestyle modifications

## 2023-01-29 NOTE — Assessment & Plan Note (Signed)
Chronic. Pain is located along R groin. Range of motion of this hip joint is limited. This presentation is c/f arthritis.  XR hip  Referral to orthopedics

## 2023-01-29 NOTE — Assessment & Plan Note (Signed)
Chronic. Has had increasing pain over the past couple of years. Has previously had injections but they did not help. Gabapentin seems to be helping some.  Increase Gabapentin to 300 mg TID  Referral to orthopedics

## 2023-01-29 NOTE — Assessment & Plan Note (Signed)
Chronic and well controlled.  Continue current management.  

## 2023-01-29 NOTE — Assessment & Plan Note (Signed)
Well controlled. Was new diagnosis a year ago. Has been trying to keep active as part of lifestyle modifications.  Recheck A1c  Continue lifestyle modifications  Consider medication initiation pending A1c result

## 2023-01-30 LAB — LIPID PANEL
Chol/HDL Ratio: 3.3 ratio (ref 0.0–5.0)
Cholesterol, Total: 130 mg/dL (ref 100–199)
HDL: 40 mg/dL (ref >39)
LDL Chol Calc (NIH): 63 mg/dL (ref 0–99)
Triglycerides: 155 mg/dL — ABNORMAL HIGH (ref 0–149)
VLDL Cholesterol Cal: 27 mg/dL (ref 5–40)

## 2023-01-30 LAB — COMPREHENSIVE METABOLIC PANEL
ALT: 20 IU/L (ref 0–44)
AST: 17 IU/L (ref 0–40)
Albumin: 4.3 g/dL (ref 3.9–4.9)
Alkaline Phosphatase: 173 IU/L — ABNORMAL HIGH (ref 44–121)
BUN/Creatinine Ratio: 13 (ref 10–24)
BUN: 12 mg/dL (ref 8–27)
Bilirubin Total: 0.9 mg/dL (ref 0.0–1.2)
CO2: 20 mmol/L (ref 20–29)
Calcium: 9.4 mg/dL (ref 8.6–10.2)
Chloride: 98 mmol/L (ref 96–106)
Creatinine, Ser: 0.9 mg/dL (ref 0.76–1.27)
Globulin, Total: 2.1 g/dL (ref 1.5–4.5)
Glucose: 348 mg/dL — ABNORMAL HIGH (ref 70–99)
Potassium: 4.1 mmol/L (ref 3.5–5.2)
Sodium: 136 mmol/L (ref 134–144)
Total Protein: 6.4 g/dL (ref 6.0–8.5)
eGFR: 94 mL/min/{1.73_m2} (ref >59)

## 2023-01-30 LAB — MICROALBUMIN / CREATININE URINE RATIO
Creatinine, Urine: 71.2 mg/dL
Microalb/Creat Ratio: 22 mg/g creat (ref 0–29)
Microalbumin, Urine: 16 ug/mL

## 2023-01-30 LAB — HEMOGLOBIN A1C
Est. average glucose Bld gHb Est-mCnc: 243 mg/dL
Hgb A1c MFr Bld: 10.1 % — ABNORMAL HIGH (ref 4.8–5.6)

## 2023-02-01 ENCOUNTER — Telehealth: Payer: Self-pay

## 2023-02-01 DIAGNOSIS — E1169 Type 2 diabetes mellitus with other specified complication: Secondary | ICD-10-CM

## 2023-02-01 MED ORDER — METFORMIN HCL 500 MG PO TABS
500.0000 mg | ORAL_TABLET | Freq: Two times a day (BID) | ORAL | 2 refills | Status: DC
Start: 2023-02-01 — End: 2023-02-26

## 2023-02-01 NOTE — Telephone Encounter (Signed)
-----   Message from Shirlee Latch sent at 01/31/2023 11:44 AM EDT ----- Normal/stable labs, except A1c is significantly elevated at 10.1.  Need to start medication for diabetes.  Recommend metformin 500 mg twice daily.  Okay to send in prescription for 60 with 2 refills.  Patient also needs to have a follow-up visit in 3 months for we will recheck A1c.  He should decrease his carbohydrate intake in the meantime as well.

## 2023-02-08 NOTE — Telephone Encounter (Signed)
Patient is scheduled for 8/27

## 2023-02-12 ENCOUNTER — Telehealth: Payer: Self-pay | Admitting: Family Medicine

## 2023-02-12 DIAGNOSIS — I10 Essential (primary) hypertension: Secondary | ICD-10-CM

## 2023-02-12 MED ORDER — EZETIMIBE 10 MG PO TABS: 10.0000 mg | ORAL_TABLET | Freq: Every day | ORAL | 3 refills | Status: DC

## 2023-02-12 MED ORDER — AMLODIPINE BESYLATE 10 MG PO TABS: 10.0000 mg | ORAL_TABLET | Freq: Every day | ORAL | 0 refills | Status: DC

## 2023-02-12 MED ORDER — CLOPIDOGREL BISULFATE 75 MG PO TABS: 75.0000 mg | ORAL_TABLET | Freq: Every day | ORAL | 3 refills | Status: DC

## 2023-02-12 NOTE — Telephone Encounter (Signed)
[facility] ezetimibe (ZETIA) 10 MG tablet  & clopidogrel (PLAVIX) 75 MG tablet  Please advise

## 2023-02-12 NOTE — Telephone Encounter (Signed)
[facility]  amLODipine (NORVASC) 10 MG tablet  Please advise

## 2023-02-12 NOTE — Telephone Encounter (Signed)
[facility] esomeprazole (NEXIUM) 40 MG capsule   Please advise

## 2023-02-14 ENCOUNTER — Telehealth: Payer: Self-pay | Admitting: Family Medicine

## 2023-02-14 DIAGNOSIS — K209 Esophagitis, unspecified without bleeding: Secondary | ICD-10-CM

## 2023-02-14 NOTE — Telephone Encounter (Signed)
[facility] esomeprazole (NEXIUM) 40 MG capsule   Please advise

## 2023-02-18 MED ORDER — ESOMEPRAZOLE MAGNESIUM 40 MG PO CPDR
40.0000 mg | DELAYED_RELEASE_CAPSULE | Freq: Every day | ORAL | 2 refills | Status: DC
Start: 2023-02-18 — End: 2023-11-05

## 2023-02-25 DIAGNOSIS — Z0189 Encounter for other specified special examinations: Secondary | ICD-10-CM | POA: Diagnosis not present

## 2023-02-25 DIAGNOSIS — M1611 Unilateral primary osteoarthritis, right hip: Secondary | ICD-10-CM | POA: Diagnosis not present

## 2023-02-26 ENCOUNTER — Other Ambulatory Visit: Payer: Self-pay | Admitting: Family Medicine

## 2023-02-26 ENCOUNTER — Telehealth: Payer: Self-pay | Admitting: Cardiovascular Disease

## 2023-02-26 DIAGNOSIS — E1169 Type 2 diabetes mellitus with other specified complication: Secondary | ICD-10-CM

## 2023-02-26 NOTE — Telephone Encounter (Signed)
    Primary Cardiologist:Muhammad Kirke Corin, MD  Chart reviewed as part of pre-operative protocol coverage. Because of Shawn Sherman's past medical history and time since last visit, he/she will require a follow-up visit in order to better assess preoperative cardiovascular risk.  Pre-op covering staff: - Please schedule Office appointment and call patient to inform them. - Please contact requesting surgeon's office via preferred method (i.e, phone, fax) to inform them of need for appointment prior to surgery.  If applicable, this message will also be routed to pharmacy pool and/or primary cardiologist for input on holding anticoagulant/antiplatelet agent as requested below so that this information is available at time of patient's appointment.   Ronney Asters, NP  02/26/2023, 10:47 AM

## 2023-02-26 NOTE — Telephone Encounter (Signed)
Pt has appt 03/05/23 with Cadence Fransico Michael, PAC. Ok per pre op APP to defer clearance to appt 03/05/23 with PAC. I will update all parties involved pt has appt 03/05/23.

## 2023-02-26 NOTE — Telephone Encounter (Signed)
   Pre-operative Risk Assessment    Patient Name: Shawn Sherman  DOB: [birth year] MRN: [id number]      Request for Surgical Clearance    Procedure:   Rt THA anterior hip at [facility]  Date of Surgery:  Clearance 04/11/23                                 Surgeon:  Dr Cassell Smiles Surgeon's Group or Practice Name:  [facility] Phone number:  [phone] Fax number:  [phone]   Type of Clearance Requested:   - Pharmacy:  Hold Clopidogrel (Plavix) instructions   Type of Anesthesia:  Local Francee Piccolo   Additional requests/questions:    Courtney Heys   02/26/2023, 10:28 AM

## 2023-03-05 ENCOUNTER — Encounter: Payer: Self-pay | Admitting: Medical

## 2023-03-05 ENCOUNTER — Ambulatory Visit: Payer: Medicare HMO | Attending: Medical | Admitting: Medical

## 2023-03-05 VITALS — BP 118/78 | HR 100 | Ht 70.0 in | Wt 183.2 lb

## 2023-03-05 DIAGNOSIS — E1159 Type 2 diabetes mellitus with other circulatory complications: Secondary | ICD-10-CM

## 2023-03-05 DIAGNOSIS — Z79899 Other long term (current) drug therapy: Secondary | ICD-10-CM | POA: Diagnosis not present

## 2023-03-05 DIAGNOSIS — E782 Mixed hyperlipidemia: Secondary | ICD-10-CM | POA: Diagnosis not present

## 2023-03-05 DIAGNOSIS — Z01818 Encounter for other preprocedural examination: Secondary | ICD-10-CM | POA: Diagnosis not present

## 2023-03-05 DIAGNOSIS — I152 Hypertension secondary to endocrine disorders: Secondary | ICD-10-CM | POA: Diagnosis not present

## 2023-03-05 DIAGNOSIS — Z7984 Long term (current) use of oral hypoglycemic drugs: Secondary | ICD-10-CM | POA: Diagnosis not present

## 2023-03-05 DIAGNOSIS — Z720 Tobacco use: Secondary | ICD-10-CM | POA: Diagnosis not present

## 2023-03-05 DIAGNOSIS — I6523 Occlusion and stenosis of bilateral carotid arteries: Secondary | ICD-10-CM | POA: Diagnosis not present

## 2023-03-05 MED ORDER — LOSARTAN POTASSIUM 25 MG PO TABS: 12.5000 mg | ORAL_TABLET | Freq: Every day | ORAL | 3 refills | Status: DC

## 2023-03-05 NOTE — Patient Instructions (Signed)
Medication Instructions:  Your physician recommends that you continue on your current medications as directed. Please refer to the Current Medication list given to you today.   *If you need a refill on your cardiac medications before your next appointment, please call your pharmacy*   Lab Work: Your provider would like for you to have following labs drawn today (TSH, Mg).  .   Testing/Procedures: Your physician has requested that you have a carotid duplex. This test is an ultrasound of the carotid arteries in your neck. It looks at blood flow through these arteries that supply the brain with blood.   Allow one hour for this exam.  There are no restrictions or special instructions.  This will take place at [address] [address] [facility], [place] [address]   Please follow up with Dr. Wyn Quaker   Follow-Up: At [facility], you and your health needs are our priority.  As part of our continuing mission to provide you with exceptional heart care, we have created designated Provider Care Teams.  These Care Teams include your primary Cardiologist (physician) and Advanced Practice Providers (APPs -  Physician Assistants and Nurse Practitioners) who all work together to provide you with the care you need, when you need it.  We recommend signing up for the patient portal called "MyChart".  Sign up information is provided on this After Visit Summary.  MyChart is used to connect with patients for Virtual Visits (Telemedicine).  Patients are able to view lab/test results, encounter notes, upcoming appointments, etc.  Non-urgent messages can be sent to your provider as well.   To learn more about what you can do with MyChart, go to [email or web address].    Your next appointment:   1 year(s)  Provider:   You may see Lorine Bears, MD or one of the following Advanced Practice Providers on your designated Care Team:   Nicolasa Ducking, NP Eula Listen, PA-C Cadence Fransico Michael,  PA-C Charlsie Quest, NP

## 2023-03-05 NOTE — Progress Notes (Signed)
Cardiology Office Note:    Date:  03/05/2023   ID:  Shawn Sherman, DOB Jun 24, 1956, MRN [id number]  PCP:  Erasmo Downer, MD  [facility] Cardiologist:  Lorine Bears, MD  [facility] [place] [facility] Electrophysiologist:  None   Referring MD: Erasmo Downer, MD   Chief Complaint: 1 year follow-up/pre-op  History of Present Illness:    Shawn Sherman is a 67 y.o. male with a hx of  HTN, HLD, tobacco use, carotid artery disease s/p bilateral carotid endarterectomies presents today for pre-op follow-up.   Seen 08/2018 for chest pain and noted to have left carotid bruit. Treadmill myoview with ST depression with exercise but reports on symptoms and perfusion imaging was normal. He underwent bilateral carotid endarterectomies 01/2019 with Dr. Wyn Quaker of [facility] for severe bilateral carotid stenosis.    Seen in clinic 12/27/2019 was noted to be out of his amlodipine for 6 months. His blood pressure was elevated and started amlodipine 10 mg daily was resumed. He was also recommended for the addition of Zetia as lipid panel 12/14/19 with LDL 96.    Seen in follow-up by Leafy Kindle, PA. Repeat lab work 03/18/2020 with total cholesterol 143, HDL 51, LDL 70. He was started on losartan 12.5 mg daily as his BP was not at goal. He was seen in follow up 04/22/20. His BP was at goal after addition of Losartan. He was awaiting shoulder surgery. Underwent shoulder athroplasty 08/22/20.   Last seen 09/12/20 and was overall feeling well. EKG showed ST with a hear rate of 110bpm, a 1 day heart monitor was placed. This showed predominately NSR with PVCs and PACs, 1 episode of SVT lasting 4 beats.   Patient was last seen 01/2022 and was overall doing well. Patient had recently retired.   Today, the patient is overall doing well. He needs right hip replacement, scheduled for October 3rd. He can walk up a flight of stairs and 1-2 blocks. Patient denies chest pain, shortness of breath. He has occasional dependent lower leg edema and  palpitations. Palpitaitons don't affect quality of life. No lightheaded or dizziness. He has not followed up with vascular surgery in a couple years. Patient is still chewing tobacco.   Past Medical History:  Diagnosis Date   AKI (acute kidney injury) (HCC) 01/26/2021   Anginal pain (HCC) 08/2018   a. 08/2018 - atyp c/p and neg trop; b. 08/2018 MV: EF 57%, inferolateral horizontal ST depression w/o ischemia or infarct.   Arthritis    Carotid arterial disease (HCC)    a. 01/2019 Carotid U/S: RICA 80-90, RCCA <50, RECA>50, LICA 80-99, LCCA>50, LECA>50; b. 01/2019 CT Head/Neck: RICA string sign in bulb - lumen <80mm. Sev prox RECA dzs. LICA 60 @ dist bulb. R vert 50-70, L vert 30-50.   Carotid bruit present 08/2018   left   Chewing tobacco nicotine dependence    Coronary artery disease    Essential hypertension 03/29/2017   GERD (gastroesophageal reflux disease)    Headache    migrraines   History of 2019 novel coronavirus disease (COVID-19) 08/04/2020   History of kidney stones    Hyperlipidemia    Sepsis secondary to UTI (HCC) 01/26/2021    Past Surgical History:  Procedure Laterality Date   ANTERIOR LAT LUMBAR FUSION Right 01/01/2013   Procedure: lumbar three-four extreme lateral interbody fusion;  Surgeon: Reinaldo Meeker, MD;  Location: [facility];  Service: Neurosurgery;  Laterality: Right;  Lumbar three-four extreme lateral interbody fusion  BACK SURGERY  08   COLONOSCOPY     CYSTOSCOPY WITH STENT PLACEMENT Left 01/26/2021   Procedure: CYSTOSCOPY WITH STENT PLACEMENT;  Surgeon: Sondra Come, MD;  Location: [facility];  Service: Urology;  Laterality: Left;   CYSTOSCOPY/URETEROSCOPY/HOLMIUM LASER/STENT PLACEMENT Left 02/24/2021   Procedure: CYSTOSCOPY/URETEROSCOPY/HOLMIUM LASER/STENT EXCHANGE;  Surgeon: Sondra Come, MD;  Location: [facility];  Service: Urology;  Laterality: Left;   ENDARTERECTOMY Right 02/18/2019   Procedure: ENDARTERECTOMY CAROTID;  Surgeon: Annice Needy, MD;   Location: [facility];  Service: Vascular;  Laterality: Right;   ENDARTERECTOMY Left 06/11/2019   Procedure: ENDARTERECTOMY CAROTID;  Surgeon: Annice Needy, MD;  Location: [facility];  Service: Vascular;  Laterality: Left;   ESOPHAGOGASTRODUODENOSCOPY (EGD) WITH PROPOFOL N/A 07/05/2015   Procedure: ESOPHAGOGASTRODUODENOSCOPY (EGD) WITH PROPOFOL;  Surgeon: Midge Minium, MD;  Location: [facility];  Service: Endoscopy;  Laterality: N/A;   EXTRACORPOREAL SHOCK WAVE LITHOTRIPSY Right 09/11/2018   Procedure: EXTRACORPOREAL SHOCK WAVE LITHOTRIPSY (ESWL);  Surgeon: Riki Altes, MD;  Location: [facility];  Service: Urology;  Laterality: Right;   LUMBAR PERCUTANEOUS PEDICLE SCREW 1 LEVEL N/A 01/01/2013   Procedure: lumbar three-four percutaneous pedicle screws ;  Surgeon: Reinaldo Meeker, MD;  Location: [facility];  Service: Neurosurgery;  Laterality: N/A;  lumbar three-four percutaneous pedicle screws   SHOULDER ARTHROSCOPY WITH ROTATOR CUFF REPAIR Left 08/22/2020   Procedure: SHOULDER ARTHROSCOPY WITH ROTATOR CUFF REPAIR AND DEBRIDEMENT;  Surgeon: Lyndle Herrlich, MD;  Location: [facility];  Service: Orthopedics;  Laterality: Left;    Current Medications: Current Meds  Medication Sig   amLODipine (NORVASC) 10 MG tablet Take 1 tablet (10 mg total) by mouth daily. PLEASE CALL [phone] TO SCHEDULE YEARLY VISIT AND FOR FURTHER REFILLS (1ST ATTEMPT)   aspirin 81 MG chewable tablet Chew 81 mg by mouth daily.   atorvastatin (LIPITOR) 80 MG tablet TAKE 1 TABLET BY MOUTH ONCE EVERY EVENING. *MAKE APPOINTMENT FOR FURTHER FILLS*   clopidogrel (PLAVIX) 75 MG tablet Take 1 tablet (75 mg total) by mouth daily with breakfast.   diclofenac Sodium (VOLTAREN) 1 % GEL Apply 1 application topically 4 (four) times daily as needed (pain).   esomeprazole (NEXIUM) 40 MG capsule Take 1 capsule (40 mg total) by mouth daily at 12 noon.   ezetimibe (ZETIA) 10 MG tablet Take 1 tablet (10 mg total) by mouth daily.   gabapentin  (NEURONTIN) 300 MG capsule Take 1 capsule (300 mg total) by mouth 3 (three) times daily.   Menthol, Topical Analgesic, (ICY HOT EX) Apply 1 application  topically daily as needed (pain).   metFORMIN (GLUCOPHAGE) 500 MG tablet TAKE 1 TABLET BY MOUTH 2 TIMES DAILY WITH A MEAL.   Multiple Vitamins-Minerals (AIRBORNE PO) Take 1 tablet by mouth daily.   tadalafil (CIALIS) 5 MG tablet Take 1-4 tablets (5-20 mg total) by mouth daily as needed for erectile dysfunction.   tamsulosin (FLOMAX) 0.4 MG CAPS capsule TAKE 1 CAPSULE BY MOUTH ONCE DAILY 30 MINUTES AFTER LARGEST MEAL.   traMADol (ULTRAM) 50 MG tablet Take 1 tablet by mouth every 6 (six) hours as needed.   [DISCONTINUED] losartan (COZAAR) 25 MG tablet TAKE 1/2 TABLET BY MOUTH ONCE DAILY     Allergies:   Patient has no known allergies.   Social History   Socioeconomic History   Marital status: Married    Spouse name: Joyce Gross   Number of children: Not on file   Years of education: Not on file   Highest education level:  Not on file  Occupational History   Not on file  Tobacco Use   Smoking status: Never   Smokeless tobacco: Current    Types: Chew  Vaping Use   Vaping status: Never Used  Substance and Sexual Activity   Alcohol use: No   Drug use: No   Sexual activity: Yes    Birth control/protection: None  Other Topics Concern   Not on file  Social History Narrative   Not on file   Social Determinants of Health   Financial Resource Strain: Low Risk  (03/14/2022)   Overall Financial Resource Strain (CARDIA)    Difficulty of Paying Living Expenses: Not hard at all  Food Insecurity: No Food Insecurity (03/14/2022)   Hunger Vital Sign    Worried About Running Out of Food in the Last Year: Never true    Ran Out of Food in the Last Year: Never true  Transportation Needs: No Transportation Needs (03/14/2022)   PRAPARE - Administrator, Civil Service (Medical): No    Lack of Transportation (Non-Medical): No  Physical Activity:  Insufficiently Active (03/14/2022)   Exercise Vital Sign    Days of Exercise per Week: 3 days    Minutes of Exercise per Session: 30 min  Stress: No Stress Concern Present (03/14/2022)   [employer] of Occupational Health - Occupational Stress Questionnaire    Feeling of Stress : Not at all  Social Connections: Moderately Isolated (03/14/2022)   Social Connection and Isolation Panel [NHANES]    Frequency of Communication with Friends and Family: More than three times a week    Frequency of Social Gatherings with Friends and Family: Once a week    Attends Religious Services: Never    Database administrator or Organizations: No    Attends Engineer, structural: Never    Marital Status: Married     Family History: The patient's family history includes COPD in his sister; Cancer in his father and mother.  ROS:   Please see the history of present illness.     All other systems reviewed and are negative.  EKGs/Labs/Other Studies Reviewed:    The following studies were reviewed today:  US Carotids 2022 Summary:  Right Carotid: Velocities in the right ICA are consistent with a 1-39%  stenosis.                Widely patent ICA s/p CEA.   Left Carotid: Velocities in the left ICA are consistent with a 1-39%  stenosis.               Widely patent ICA s/p CEA.   Vertebrals:  Bilateral vertebral arteries demonstrate antegrade flow.  Subclavians: Normal flow hemodynamics were seen in bilateral subclavian               arteries.   *See table(s) above for measurements and observations.     Heart monitor 2022 Patch Wear Time:  7 days and 5 hours (2022-03-07T12:20:19-0500 to 2022-03-14T18:39:42-0400)   Patient had a min HR of 56 bpm, max HR of 156 bpm, and avg HR of 82 bpm.  Predominant underlying rhythm was Sinus Rhythm.  1 run of Supraventricular Tachycardia occurred lasting 4 beats with a max rate of 156 bpm (avg 146 bpm).  Rare PACs and PVCs.  Myoview lexiscan  2020 Narrative & Impression  Probably normal myocardial perfusion stress test without significant ischemia or scar on perfusion imaging. The left ventricular ejection fraction is normal (57%). Horizontal ST  segment depression ST segment depression of 2 mm was noted during stress in the II, III, aVF, V4, V5 and V6 leads. This is a low risk study based on the perfusion imaging. However, significant ST depressions were noted during stress. Correlate clinically.       EKG:  EKG is ordered today.  The ekg ordered today demonstrates NSR 100bpm, TWI inf leads  Recent Labs: 01/29/2023: ALT 20; BUN 12; Creatinine, Ser 0.90; Potassium 4.1; Sodium 136  Recent Lipid Panel    Component Value Date/Time   CHOL 130 01/29/2023 1400   TRIG 155 (H) 01/29/2023 1400   HDL 40 01/29/2023 1400   CHOLHDL 3.3 01/29/2023 1400   CHOLHDL 2.7 04/19/2017 1143   LDLCALC 63 01/29/2023 1400   LDLCALC 73 04/19/2017 1143     Physical Exam:    VS:  BP 118/78 (BP Location: Left Arm, Patient Position: Sitting, Cuff Size: Normal)   Pulse 100   Ht 5\' 10"  (1.778 m)   Wt 183 lb 3.2 oz (83.1 kg)   SpO2 97%   BMI 26.29 kg/m     Wt Readings from Last 3 Encounters:  03/05/23 183 lb 3.2 oz (83.1 kg)  01/29/23 182 lb 8 oz (82.8 kg)  03/14/22 179 lb (81.2 kg)     GEN:  Well nourished, well developed in no acute distress HEENT: Normal NECK: No JVD; No carotid bruits LYMPHATICS: No lymphadenopathy CARDIAC: RRR, no murmurs, rubs, gallops RESPIRATORY:  Clear to auscultation without rales, wheezing or rhonchi  ABDOMEN: Soft, non-tender, non-distended MUSCULOSKELETAL:  No edema; No deformity  SKIN: Warm and dry NEUROLOGIC:  Alert and oriented x 3 PSYCHIATRIC:  Normal affect   ASSESSMENT:    1. Pre-op evaluation   2. Hypertension associated with diabetes (HCC)   3. Hyperlipidemia, mixed   4. Bilateral carotid artery stenosis   5. Tobacco use   6. Medication management    PLAN:    In order of problems listed  above:  Pre-op for right hip replacement HTN HLD Carotid artery disease s/p endarterectomies Tobacco use Patient has been scheduled for right hip replacement on October 3rd. Patient is overall stable from a cardiac perspective. He denies chest pain or shortness of breath.  He has occasional dependent edema and rare palpitations.  EKG shows normal sinus rhythm with a heart rate of 100 bpm.  I will check TSH and mag.  Patient has a history of bilateral carotid artery endarterectomies.  I will update carotid ultrasound, I recommended he follow-up with [facility].  Other recent labs are stable.  Vitals are stable.  We will continue amlodipine, aspirin, Lipitor, Plavix, Zetia, losartan.  Refill losartan today.  METS>4 although function is limited by right hip pain.  According to RCRI patient is low risk, 3.9% 30-day risk of death, MI, or cardiac arrest no further cardiac workup prior to surgery.  I will defer anticoagulation to vascular surgery. Tobacco cessation recommended.  Disposition: Follow up in 1 year(s) with MD/APP    Signed, Megham Dwyer David Stall, PA-C  03/05/2023 3:39 PM    [facility]

## 2023-03-06 LAB — MAGNESIUM: Magnesium: 1.9 mg/dL (ref 1.6–2.3)

## 2023-03-06 LAB — TSH: TSH: 3.13 u[IU]/mL (ref 0.450–4.500)

## 2023-03-07 ENCOUNTER — Other Ambulatory Visit: Payer: Self-pay | Admitting: Family Medicine

## 2023-03-07 DIAGNOSIS — E782 Mixed hyperlipidemia: Secondary | ICD-10-CM

## 2023-03-07 DIAGNOSIS — I10 Essential (primary) hypertension: Secondary | ICD-10-CM

## 2023-03-18 ENCOUNTER — Ambulatory Visit (INDEPENDENT_AMBULATORY_CARE_PROVIDER_SITE_OTHER): Payer: Medicare HMO

## 2023-03-18 VITALS — BP 120/68 | Ht 70.0 in | Wt 179.1 lb

## 2023-03-18 DIAGNOSIS — Z0000 Encounter for general adult medical examination without abnormal findings: Secondary | ICD-10-CM | POA: Diagnosis not present

## 2023-03-18 DIAGNOSIS — Z1211 Encounter for screening for malignant neoplasm of colon: Secondary | ICD-10-CM

## 2023-03-18 NOTE — Patient Instructions (Signed)
Shawn Sherman , Thank you for taking time to come for your Medicare Wellness Visit. I appreciate your ongoing commitment to your health goals. Please review the following plan we discussed and let me know if I can assist you in the future.   Referrals/Orders/Follow-Ups/Clinician Recommendations: GE referral  This is a list of the screening recommended for you and due dates:  Health Maintenance  Topic Date Due   COVID-19 Vaccine (3 - Pfizer risk series) 04/02/2020   DTaP/Tdap/Td vaccine (3 - Td or Tdap) 06/24/2021   Eye exam for diabetics  03/18/2023*   Colon Cancer Screening  04/17/2023*   Zoster (Shingles) Vaccine (1 of 2) 06/17/2023*   Flu Shot  10/07/2023*   Hemoglobin A1C  08/01/2023   Yearly kidney function blood test for diabetes  01/29/2024   Yearly kidney health urinalysis for diabetes  01/29/2024   Complete foot exam   01/29/2024   Medicare Annual Wellness Visit  03/17/2024   Pneumonia Vaccine  Completed   Hepatitis C Screening  Completed   HPV Vaccine  Aged Out  *Topic was postponed. The date shown is not the original due date.    Advanced directives: (Declined) Advance directive discussed with you today. Even though you declined this today, please call our office should you change your mind, and we can give you the proper paperwork for you to fill out.  Next Medicare Annual Wellness Visit scheduled for next year: Yes  03/18/24 @ 1:30pm telephone

## 2023-03-18 NOTE — Progress Notes (Signed)
Subjective:   Shawn Sherman is a 67 y.o. male who presents for Medicare Annual/Subsequent preventive examination.  Visit Complete: In person  Patient Medicare AWV questionnaire was completed by the patient on (not done); I have confirmed that all information answered by patient is correct and no changes since this date.  Review of Systems    Cardiac Risk Factors include: advanced age (>74men, >23 women);diabetes mellitus;dyslipidemia;male gender;hypertension;sedentary lifestyle    Objective:    Today's Vitals   03/18/23 1403 03/18/23 1405  BP: 120/68   Weight: 179 lb 1.6 oz (81.2 kg)   Height: 5\' 10"  (1.778 m)   PainSc:  5    Body mass index is 25.7 kg/m.     03/18/2023    2:16 PM 03/14/2022   10:36 AM 02/24/2021    8:16 AM 02/16/2021   11:35 AM 01/26/2021   12:37 PM 08/22/2020    1:47 PM 07/29/2020    8:42 AM  Advanced Directives  Does Patient Have a Medical Advance Directive? No No No No No No No  Would patient like information on creating a medical advance directive?  No - Patient declined   No - Patient declined No - Patient declined No - Patient declined    Current Medications (verified) Outpatient Encounter Medications as of 03/18/2023  Medication Sig   amLODipine (NORVASC) 10 MG tablet TAKE 1 TABLET (10 MG TOTAL) BY MOUTH DAILY. PLEASE CALL [phone] TO SCHEDULE YEARLY VISIT AND FOR FURTHER REFILLS (1ST ATTEMPT)   aspirin 81 MG chewable tablet Chew 81 mg by mouth daily.   atorvastatin (LIPITOR) 80 MG tablet TAKE 1 TABLET BY MOUTH ONCE EVERY EVENING. *MAKE APPOINTMENT FOR FURTHER FILLS*   clopidogrel (PLAVIX) 75 MG tablet Take 1 tablet (75 mg total) by mouth daily with breakfast.   diclofenac Sodium (VOLTAREN) 1 % GEL Apply 1 application topically 4 (four) times daily as needed (pain).   esomeprazole (NEXIUM) 40 MG capsule Take 1 capsule (40 mg total) by mouth daily at 12 noon.   ezetimibe (ZETIA) 10 MG tablet Take 1 tablet (10 mg total) by mouth daily.   gabapentin  (NEURONTIN) 300 MG capsule Take 1 capsule (300 mg total) by mouth 3 (three) times daily.   losartan (COZAAR) 25 MG tablet Take 0.5 tablets (12.5 mg total) by mouth daily.   Menthol, Topical Analgesic, (ICY HOT EX) Apply 1 application  topically daily as needed (pain).   metFORMIN (GLUCOPHAGE) 500 MG tablet TAKE 1 TABLET BY MOUTH 2 TIMES DAILY WITH A MEAL.   Multiple Vitamins-Minerals (AIRBORNE PO) Take 1 tablet by mouth daily.   tadalafil (CIALIS) 5 MG tablet Take 1-4 tablets (5-20 mg total) by mouth daily as needed for erectile dysfunction.   tamsulosin (FLOMAX) 0.4 MG CAPS capsule TAKE 1 CAPSULE BY MOUTH ONCE DAILY 30 MINUTES AFTER LARGEST MEAL.   traMADol (ULTRAM) 50 MG tablet Take 1 tablet by mouth every 6 (six) hours as needed.   No facility-administered encounter medications on file as of 03/18/2023.    Allergies (verified) Patient has no known allergies.   History: Past Medical History:  Diagnosis Date   AKI (acute kidney injury) (HCC) 01/26/2021   Anginal pain (HCC) 08/2018   a. 08/2018 - atyp c/p and neg trop; b. 08/2018 MV: EF 57%, inferolateral horizontal ST depression w/o ischemia or infarct.   Arthritis    Carotid arterial disease (HCC)    a. 01/2019 Carotid U/S: RICA 80-90, RCCA <50, RECA>50, LICA 80-99, LCCA>50, LECA>50; b. 01/2019 CT Head/Neck:  RICA string sign in bulb - lumen <37mm. Sev prox RECA dzs. LICA 60 @ dist bulb. R vert 50-70, L vert 30-50.   Carotid bruit present 08/2018   left   Chewing tobacco nicotine dependence    Coronary artery disease    Essential hypertension 03/29/2017   GERD (gastroesophageal reflux disease)    Headache    migrraines   History of 2019 novel coronavirus disease (COVID-19) 08/04/2020   History of kidney stones    Hyperlipidemia    Sepsis secondary to UTI (HCC) 01/26/2021   Past Surgical History:  Procedure Laterality Date   ANTERIOR LAT LUMBAR FUSION Right 01/01/2013   Procedure: lumbar three-four extreme lateral interbody fusion;   Surgeon: Reinaldo Meeker, MD;  Location: [facility];  Service: Neurosurgery;  Laterality: Right;  Lumbar three-four extreme lateral interbody fusion     BACK SURGERY  08   COLONOSCOPY     CYSTOSCOPY WITH STENT PLACEMENT Left 01/26/2021   Procedure: CYSTOSCOPY WITH STENT PLACEMENT;  Surgeon: Sondra Come, MD;  Location: [facility];  Service: Urology;  Laterality: Left;   CYSTOSCOPY/URETEROSCOPY/HOLMIUM LASER/STENT PLACEMENT Left 02/24/2021   Procedure: CYSTOSCOPY/URETEROSCOPY/HOLMIUM LASER/STENT EXCHANGE;  Surgeon: Sondra Come, MD;  Location: [facility];  Service: Urology;  Laterality: Left;   ENDARTERECTOMY Right 02/18/2019   Procedure: ENDARTERECTOMY CAROTID;  Surgeon: Annice Needy, MD;  Location: [facility];  Service: Vascular;  Laterality: Right;   ENDARTERECTOMY Left 06/11/2019   Procedure: ENDARTERECTOMY CAROTID;  Surgeon: Annice Needy, MD;  Location: [facility];  Service: Vascular;  Laterality: Left;   ESOPHAGOGASTRODUODENOSCOPY (EGD) WITH PROPOFOL N/A 07/05/2015   Procedure: ESOPHAGOGASTRODUODENOSCOPY (EGD) WITH PROPOFOL;  Surgeon: Midge Minium, MD;  Location: [facility];  Service: Endoscopy;  Laterality: N/A;   EXTRACORPOREAL SHOCK WAVE LITHOTRIPSY Right 09/11/2018   Procedure: EXTRACORPOREAL SHOCK WAVE LITHOTRIPSY (ESWL);  Surgeon: Riki Altes, MD;  Location: [facility];  Service: Urology;  Laterality: Right;   LUMBAR PERCUTANEOUS PEDICLE SCREW 1 LEVEL N/A 01/01/2013   Procedure: lumbar three-four percutaneous pedicle screws ;  Surgeon: Reinaldo Meeker, MD;  Location: [facility];  Service: Neurosurgery;  Laterality: N/A;  lumbar three-four percutaneous pedicle screws   SHOULDER ARTHROSCOPY WITH ROTATOR CUFF REPAIR Left 08/22/2020   Procedure: SHOULDER ARTHROSCOPY WITH ROTATOR CUFF REPAIR AND DEBRIDEMENT;  Surgeon: Lyndle Herrlich, MD;  Location: [facility];  Service: Orthopedics;  Laterality: Left;   Family History  Problem Relation Age of Onset   Cancer Mother    Cancer Father     COPD Sister    Social History   Socioeconomic History   Marital status: Married    Spouse name: Joyce Gross   Number of children: Not on file   Years of education: Not on file   Highest education level: Not on file  Occupational History   Not on file  Tobacco Use   Smoking status: Never   Smokeless tobacco: Current    Types: Chew  Vaping Use   Vaping status: Never Used  Substance and Sexual Activity   Alcohol use: No   Drug use: No   Sexual activity: Yes    Birth control/protection: None  Other Topics Concern   Not on file  Social History Narrative   Not on file   Social Determinants of Health   Financial Resource Strain: Low Risk  (03/18/2023)   Overall Financial Resource Strain (CARDIA)    Difficulty of Paying Living Expenses: Not hard at all  Food Insecurity: No Food Insecurity (03/18/2023)  Hunger Vital Sign    Worried About Running Out of Food in the Last Year: Never true    Ran Out of Food in the Last Year: Never true  Transportation Needs: No Transportation Needs (03/18/2023)   PRAPARE - Administrator, Civil Service (Medical): No    Lack of Transportation (Non-Medical): No  Physical Activity: Inactive (03/18/2023)   Exercise Vital Sign    Days of Exercise per Week: 0 days    Minutes of Exercise per Session: 0 min  Stress: No Stress Concern Present (03/18/2023)   [employer] of Occupational Health - Occupational Stress Questionnaire    Feeling of Stress : Not at all  Social Connections: Unknown (03/18/2023)   Social Connection and Isolation Panel [NHANES]    Frequency of Communication with Friends and Family: Not on file    Frequency of Social Gatherings with Friends and Family: Not on file    Attends Religious Services: Never    Database administrator or Organizations: No    Attends Engineer, structural: Never    Marital Status: Married    Tobacco Counseling Ready to quit: Not Answered Counseling given: Not Answered   Clinical  Intake:  Pre-visit preparation completed: Yes  Pain : 0-10 Pain Score: 5  Pain Type: Chronic pain Pain Location: Hip Pain Orientation: Right Pain Descriptors / Indicators: Aching Pain Onset: More than a month ago Pain Frequency: Constant Pain Relieving Factors: medication  Pain Relieving Factors: medication  BMI - recorded: 25.7 Nutritional Status: BMI 25 -29 Overweight Nutritional Risks: Nausea/ vomitting/ diarrhea (vomits in morning before eatsx3days:he thinks GERD) Diabetes: Yes CBG done?: No Did pt. bring in CBG monitor from home?: No  How often do you need to have someone help you when you read instructions, pamphlets, or other written materials from your doctor or pharmacy?: 1 - Never  Interpreter Needed?: No  Comments: wife lives him and son Information entered by :: B.Jerika Wales,LPN   Activities of Daily Living    03/18/2023    2:16 PM  In your present state of health, do you have any difficulty performing the following activities:  Hearing? 0  Vision? 0  Difficulty concentrating or making decisions? 0  Walking or climbing stairs? 1  Dressing or bathing? 0  Doing errands, shopping? 1  Preparing Food and eating ? N  Using the Toilet? N  In the past six months, have you accidently leaked urine? N  Do you have problems with loss of bowel control? N  Managing your Medications? N  Managing your Finances? N  Housekeeping or managing your Housekeeping? N    Patient Care Team: Erasmo Downer, MD as PCP - General (Family Medicine) Iran Ouch, MD as PCP - Cardiology (Cardiology)  Indicate any recent Medical Services you may have received from other than [facility] providers in the past year (date may be approximate).     Assessment:   This is a routine wellness examination for Burman.  Hearing/Vision screen Hearing Screening - Comments:: Adequate hearing Vision Screening - Comments:: Adequate vision with glasses    Goals Addressed              This Visit's Progress    COMPLETED: DIET - EAT MORE FRUITS AND VEGETABLES   On track      Depression Screen    03/18/2023    2:14 PM 03/14/2022   10:34 AM 03/14/2022   10:33 AM 10/09/2021    2:31 PM 02/09/2021   11:14  AM 12/31/2019   11:36 AM 03/29/2017   11:01 AM  PHQ 2/9 Scores  PHQ - 2 Score 0 0 0 2 0 0 2  PHQ- 9 Score  0 0 7   7    Fall Risk    03/18/2023    2:10 PM 03/14/2022   10:37 AM 10/09/2021    2:32 PM 12/31/2019   11:36 AM  Fall Risk   Falls in the past year? 0 0 1 0  Number falls in past yr: 0 0 0 0  Injury with Fall? 0 0 0 0  Risk for fall due to : No Fall Risks No Fall Risks History of fall(s) No Fall Risks  Follow up Falls prevention discussed;Education provided Falls evaluation completed Falls evaluation completed;Education provided;Falls prevention discussed Falls evaluation completed    MEDICARE RISK AT HOME: Medicare Risk at Home Any stairs in or around the home?: Yes If so, are there any without handrails?: Yes Home free of loose throw rugs in walkways, pet beds, electrical cords, etc?: Yes Adequate lighting in your home to reduce risk of falls?: Yes Life alert?: No Use of a cane, walker or w/c?: No Grab bars in the bathroom?: No Shower chair or bench in shower?: No Elevated toilet seat or a handicapped toilet?: No  TIMED UP AND GO:  Was the test performed?  Yes  Length of time to ambulate 10 feet: 13 sec Gait slow and steady without use of assistive device    Cognitive Function:        03/18/2023    2:22 PM 03/14/2022   10:39 AM  6CIT Screen  What Year? 0 points 0 points  What month? 0 points 0 points  What time? 0 points 0 points  Count back from 20 0 points 0 points  Months in reverse 2 points 0 points  Repeat phrase 0 points 0 points  Total Score 2 points 0 points    Immunizations Immunization History  Administered Date(s) Administered   PFIZER(Purple Top)SARS-COV-2 Vaccination 02/06/2020, 03/05/2020   PNEUMOCOCCAL CONJUGATE-20 10/09/2021    Td 06/25/2011   Tdap 06/25/2011    TDAP status: Up to date  Flu Vaccine status: Declined, Education has been provided regarding the importance of this vaccine but patient still declined. Advised may receive this vaccine at local pharmacy or Health Dept. Aware to provide a copy of the vaccination record if obtained from local pharmacy or Health Dept. Verbalized acceptance and understanding.  Pneumococcal vaccine status: Up to date  Covid-19 vaccine status: Completed vaccines  Qualifies for Shingles Vaccine? Yes   Zostavax completed No   Shingrix Completed?: No.    Education has been provided regarding the importance of this vaccine. Patient has been advised to call insurance company to determine out of pocket expense if they have not yet received this vaccine. Advised may also receive vaccine at local pharmacy or Health Dept. Verbalized acceptance and understanding.  Screening Tests Health Maintenance  Topic Date Due   COVID-19 Vaccine (3 - Pfizer risk series) 04/02/2020   DTaP/Tdap/Td (3 - Td or Tdap) 06/24/2021   OPHTHALMOLOGY EXAM  03/18/2023 (Originally 03/05/1966)   Colonoscopy  04/17/2023 (Originally 03/30/2018)   Zoster Vaccines- Shingrix (1 of 2) 06/17/2023 (Originally 03/06/1975)   INFLUENZA VACCINE  10/07/2023 (Originally 02/07/2023)   HEMOGLOBIN A1C  08/01/2023   Diabetic kidney evaluation - eGFR measurement  01/29/2024   Diabetic kidney evaluation - Urine ACR  01/29/2024   FOOT EXAM  01/29/2024   Medicare Annual Wellness (AWV)  03/17/2024   Pneumonia Vaccine 90+ Years old  Completed   Hepatitis C Screening  Completed   HPV VACCINES  Aged Out    Health Maintenance  Health Maintenance Due  Topic Date Due   COVID-19 Vaccine (3 - Pfizer risk series) 04/02/2020   DTaP/Tdap/Td (3 - Td or Tdap) 06/24/2021    Colorectal cancer screening: Referral to GI placed yes. Pt aware the office will call re: appt.  Lung Cancer Screening: (Low Dose CT Chest recommended if Age 48-80  years, 20 pack-year currently smoking OR have quit w/in 15years.) does not qualify.   Lung Cancer Screening Referral: no  Additional Screening:  Hepatitis C Screening: does not qualify; Completed yes  Vision Screening: Recommended annual ophthalmology exams for early detection of glaucoma and other disorders of the eye. Is the patient up to date with their annual eye exam?  Yes  Who is the provider or what is the name of the office in which the patient attends annual eye exams? [facility] If pt is not established with a provider, would they like to be referred to a provider to establish care? No . Pt will schedule  Dental Screening: Recommended annual dental exams for proper oral hygiene  Diabetic Foot Exam: Diabetic Foot Exam: Overdue, Pt has been advised about the importance in completing this exam. Pt is scheduled for diabetic foot exam on next PCP visit.  Community Resource Referral / Chronic Care Management: CRR required this visit?  No   CCM required this visit?  No    Plan:     I have personally reviewed and noted the following in the patient's chart:   Medical and social history Use of alcohol, tobacco or illicit drugs  Current medications and supplements including opioid prescriptions. Patient is not currently taking opioid prescriptions. Functional ability and status Nutritional status Physical activity Advanced directives List of other physicians Hospitalizations, surgeries, and ER visits in previous 12 months Vitals Screenings to include cognitive, depression, and falls Referrals and appointments  In addition, I have reviewed and discussed with patient certain preventive protocols, quality metrics, and best practice recommendations. A written personalized care plan for preventive services as well as general preventive health recommendations were provided to patient.     Sue Lush, LPN   07/14/1094   After Visit Summary: (MyChart) Due to this being a  telephonic visit, the after visit summary with patients personalized plan was offered to patient via MyChart   Nurse Notes: Pt says he is doing alright as he continues to manage rt hip pain. He says he is scheduled for hip surgery on 04/11/23 for replacement.He has no concerns or questions at this time.

## 2023-03-21 ENCOUNTER — Telehealth: Payer: Self-pay

## 2023-03-21 NOTE — Telephone Encounter (Signed)
Postpone scheduling colonoscopy due to hip replacement surgery is planned for October.  Referral will be closed.  He will be called back in January 2025.  Cardiologist Dr. Kirke Corin.  Thanks, [place], [place]

## 2023-04-03 ENCOUNTER — Ambulatory Visit: Payer: Medicare HMO | Attending: Medical

## 2023-04-03 DIAGNOSIS — I6523 Occlusion and stenosis of bilateral carotid arteries: Secondary | ICD-10-CM | POA: Diagnosis not present

## 2023-05-06 ENCOUNTER — Encounter: Payer: Self-pay | Admitting: Family Medicine

## 2023-05-06 ENCOUNTER — Ambulatory Visit (INDEPENDENT_AMBULATORY_CARE_PROVIDER_SITE_OTHER): Payer: Medicare HMO | Admitting: Family Medicine

## 2023-05-06 VITALS — BP 105/68 | HR 108 | Ht 70.0 in | Wt 175.3 lb

## 2023-05-06 DIAGNOSIS — E1169 Type 2 diabetes mellitus with other specified complication: Secondary | ICD-10-CM | POA: Diagnosis not present

## 2023-05-06 DIAGNOSIS — Z23 Encounter for immunization: Secondary | ICD-10-CM

## 2023-05-06 DIAGNOSIS — M1611 Unilateral primary osteoarthritis, right hip: Secondary | ICD-10-CM | POA: Diagnosis not present

## 2023-05-06 DIAGNOSIS — Z7984 Long term (current) use of oral hypoglycemic drugs: Secondary | ICD-10-CM

## 2023-05-06 LAB — POCT GLYCOSYLATED HEMOGLOBIN (HGB A1C): Hemoglobin A1C: 7 % — AB (ref 4.0–5.6)

## 2023-05-06 NOTE — Assessment & Plan Note (Signed)
Improved glycemic control with A1c reduction from 10.1 to 7.0 over three months. This improvement is attributed to dietary changes and adherence to Metformin 500mg  BID. -Continue current management plan. -Notify surgeon (Dr. Odis Luster) of improved A1c to reschedule hip surgery.

## 2023-05-06 NOTE — Progress Notes (Signed)
Established Patient Office Visit  Subjective   Patient ID: Shawn Sherman, male    DOB: [birth year]  Age: 67 y.o. MRN: [id number]  Chief Complaint  Patient presents with   Medical Management of Chronic Issues    3 month DM II follow-up. Patient was to start metformin 500 mg twice daily and cut back on carbohydrates. Patient reports taking medication as prescribed and making dietary changes. NO     HPI  Discussed the use of AI scribe software for clinical note transcription with the patient, who gave verbal consent to proceed.  History of Present Illness   The patient, with a history of diabetes and hip pain, presents with worsening hip pain. The patient was scheduled for hip surgery, but the procedure was cancelled due to high blood sugar levels. The patient has been actively working on controlling their blood sugar levels and has successfully reduced their A1c from 10.1 to 7 over the past three months. The patient also reports a weight loss of seven pounds during this period. The patient is currently on metformin (500mg  twice a day) for diabetes management. The patient also mentions a potential need for a tetanus shot and an eye exam, which they have not had in the past five years.        ROS    Objective:     BP 105/68 (BP Location: Left Arm, Patient Position: Sitting, Cuff Size: Normal)   Pulse (!) 108   Ht 5\' 10"  (1.778 m)   Wt 175 lb 4.8 oz (79.5 kg)   SpO2 96%   BMI 25.15 kg/m    Physical Exam Vitals reviewed.  Constitutional:      General: He is not in acute distress.    Appearance: Normal appearance. He is not diaphoretic.  HENT:     Head: Normocephalic and atraumatic.  Eyes:     General: No scleral icterus.    Conjunctiva/sclera: Conjunctivae normal.  Cardiovascular:     Rate and Rhythm: Normal rate and regular rhythm.     Heart sounds: Normal heart sounds. No murmur heard. Pulmonary:     Effort: Pulmonary effort is normal. No respiratory distress.      Breath sounds: Normal breath sounds. No wheezing or rhonchi.  Musculoskeletal:     Cervical back: Neck supple.     Right lower leg: No edema.     Left lower leg: No edema.  Lymphadenopathy:     Cervical: No cervical adenopathy.  Skin:    General: Skin is warm and dry.     Findings: No rash.  Neurological:     Mental Status: He is alert and oriented to person, place, and time. Mental status is at baseline.  Psychiatric:        Mood and Affect: Mood normal.        Behavior: Behavior normal.      Results for orders placed or performed in visit on 05/06/23  POCT HgB A1C  Result Value Ref Range   Hemoglobin A1C 7.0 (A) 4.0 - 5.6 %   HbA1c POC (<> result, manual entry)     HbA1c, POC (prediabetic range)     HbA1c, POC (controlled diabetic range)        The 10-year ASCVD risk score (Arnett DK, et al., 2019) is: 19.4%    Assessment & Plan:   Problem List Items Addressed This Visit       Endocrine   T2DM (type 2 diabetes mellitus) (HCC) - Primary  Improved glycemic control with A1c reduction from 10.1 to 7.0 over three months. This improvement is attributed to dietary changes and adherence to Metformin 500mg  BID. -Continue current management plan. -Notify surgeon (Dr. Odis Luster) of improved A1c to reschedule hip surgery.      Relevant Orders   POCT HgB A1C (Completed)   Other Visit Diagnoses     Encounter for immunization       Relevant Orders   Flu Vaccine Trivalent High Dose (Fluad) (Completed)   Primary osteoarthritis of right hip               Hip Pain Worsening pain, likely due to osteoarthritis. Surgery was previously postponed due to poor glycemic control. -Reschedule surgery with Dr. Odis Luster now that A1c is improved.  Weight Management Noted weight loss of 7 pounds since last visit. -Continue current dietary habits.  Preventive Health -Recommend tetanus shot at pharmacy. -Recommend diabetic eye exam. -Physical exam already scheduled for three months  from now.  Family Health -Will accept patient's son as a new patient given existing family relationship. -Continue current management for patient's wife until her appointment in December.        Return in about 3 months (around 08/06/2023) for CPE, as scheduled.    Shirlee Latch, MD

## 2023-05-10 HISTORY — PX: HIP SURGERY: SHX245

## 2023-05-16 ENCOUNTER — Telehealth: Payer: Self-pay

## 2023-05-16 ENCOUNTER — Telehealth: Payer: Self-pay | Admitting: Cardiovascular Disease

## 2023-05-16 NOTE — Telephone Encounter (Signed)
Patient scheduled for tele visit on 05/17/23. Med rec and consent done. Ok to use provider use slot per preop APP

## 2023-05-16 NOTE — Telephone Encounter (Signed)
   Name: Shawn Sherman  DOB: 1955-11-05  MRN: [id number]  Primary Cardiologist: Lorine Bears, MD   Preoperative team, please contact this patient and set up a phone call appointment for further preoperative risk assessment. Please obtain consent and complete medication review. Thank you for your help.  I confirm that guidance regarding antiplatelet and oral anticoagulation therapy has been completed and, if necessary, noted below.  Plavix and aspirin both managed by vascular surgery  I also confirmed the patient resides in the state of [place]. As per [facility] Medical Board telemedicine laws, the patient must reside in the state in which the provider is licensed.   Napoleon Form, Leodis Rains, NP 05/16/2023, 12:05 PM [facility]

## 2023-05-16 NOTE — Telephone Encounter (Signed)
      Pre-operative Risk Assessment    Patient Name: Shawn Sherman  DOB: 1955/11/21 MRN: [id number]      Request for Surgical Clearance    Procedure:   Right Total Hip Replacement  Arterior  Date of Surgery:  Clearance 05/23/23                                 Surgeon:  Dr. Cassell Smiles Surgeon's Group or Practice Name:  [facility]  Phone number:  [phone] Fax number:  [phone]   Type of Clearance Requested:   - Medical    Type of Anesthesia:  Not Indicated   Additional requests/questions:    SignedShawna Orleans   05/16/2023, 11:58 AM

## 2023-05-16 NOTE — Telephone Encounter (Signed)
  Patient Consent for Virtual Visit         Shawn Sherman has provided verbal consent on 05/16/2023 for a virtual visit (video or telephone).   CONSENT FOR VIRTUAL VISIT FOR:  Shawn Sherman  By participating in this virtual visit I agree to the following:  I hereby voluntarily request, consent and authorize [facility] and its employed or contracted physicians, physician assistants, nurse practitioners or other licensed health care professionals (the Practitioner), to provide me with telemedicine health care services (the "Services") as deemed necessary by the treating Practitioner. I acknowledge and consent to receive the Services by the Practitioner via telemedicine. I understand that the telemedicine visit will involve communicating with the Practitioner through live audiovisual communication technology and the disclosure of certain medical information by electronic transmission. I acknowledge that I have been given the opportunity to request an in-person assessment or other available alternative prior to the telemedicine visit and am voluntarily participating in the telemedicine visit.  I understand that I have the right to withhold or withdraw my consent to the use of telemedicine in the course of my care at any time, without affecting my right to future care or treatment, and that the Practitioner or I may terminate the telemedicine visit at any time. I understand that I have the right to inspect all information obtained and/or recorded in the course of the telemedicine visit and may receive copies of available information for a reasonable fee.  I understand that some of the potential risks of receiving the Services via telemedicine include:  Delay or interruption in medical evaluation due to technological equipment failure or disruption; Information transmitted may not be sufficient (e.g. poor resolution of images) to allow for appropriate medical decision making by the Practitioner;  and/or  In rare instances, security protocols could fail, causing a breach of personal health information.  Furthermore, I acknowledge that it is my responsibility to provide information about my medical history, conditions and care that is complete and accurate to the best of my ability. I acknowledge that Practitioner's advice, recommendations, and/or decision may be based on factors not within their control, such as incomplete or inaccurate data provided by me or distortions of diagnostic images or specimens that may result from electronic transmissions. I understand that the practice of medicine is not an exact science and that Practitioner makes no warranties or guarantees regarding treatment outcomes. I acknowledge that a copy of this consent can be made available to me via my patient portal [facility] MyChart), or I can request a printed copy by calling the office of [facility].    I understand that my insurance will be billed for this visit.   I have read or had this consent read to me. I understand the contents of this consent, which adequately explains the benefits and risks of the Services being provided via telemedicine.  I have been provided ample opportunity to ask questions regarding this consent and the Services and have had my questions answered to my satisfaction. I give my informed consent for the services to be provided through the use of telemedicine in my medical care

## 2023-05-17 ENCOUNTER — Ambulatory Visit: Payer: Medicare HMO | Attending: Nurse Practitioner

## 2023-05-17 ENCOUNTER — Telehealth: Payer: Self-pay | Admitting: Cardiovascular Disease

## 2023-05-17 DIAGNOSIS — Z01810 Encounter for preprocedural cardiovascular examination: Secondary | ICD-10-CM

## 2023-05-17 NOTE — Telephone Encounter (Signed)
Our office received a duplicate request. Pt was cleared this morning by Robin Searing, NP who has faxed his notes providing clearance. I will re-fax the notes again.

## 2023-05-17 NOTE — Progress Notes (Signed)
Virtual Visit via Telephone Note   Because of Shawn Sherman's co-morbid illnesses, he is at least at moderate risk for complications without adequate follow up.  This format is felt to be most appropriate for this patient at this time.  The patient did not have access to video technology/had technical difficulties with video requiring transitioning to audio format only (telephone).  All issues noted in this document were discussed and addressed.  No physical exam could be performed with this format.  Please refer to the patient's chart for his consent to telehealth for [facility].  Evaluation Performed:  Preoperative cardiovascular risk assessment _____________   Date:  05/17/2023   Patient ID:  Shawn Sherman, DOB 19-Oct-1955, MRN [id number] Patient Location:  Home Provider location:   Office  Primary Care Provider:  Erasmo Downer, MD Primary Cardiologist:  Lorine Bears, MD  Chief Complaint / Patient Profile   67 y.o. y/o male with a h/o HTN, HLD, tobacco use, carotid artery disease s/p bilateral carotid endarterectomies, tobacco abuse who is pending right total hip arthroplasty and presents today for telephonic preoperative cardiovascular risk assessment.  History of Present Illness    Shawn Sherman is a 67 y.o. male who presents via audio/video conferencing for a telehealth visit today.  Pt was last seen in cardiology clinic on 03/05/2023 by Cadence Fransico Michael, PA.  At that time Shawn Sherman was doing well and reported some occasional lower leg edema and palpitations. He was still using chewing tobacco. The patient is now pending procedure as outlined above. Since his last visit, he has been doing well with no new cardiac complaints since his follow-up in August.  He is still active but limited by hip pain and uses a cane to assist in ambulation.  He denies chest pain, shortness of breath, lower extremity edema, fatigue, palpitations, melena, hematuria, hemoptysis,  diaphoresis, weakness, presyncope, syncope, orthopnea, and PND.   Plavix and aspirin both managed by vascular surgery   Past Medical History    Past Medical History:  Diagnosis Date   AKI (acute kidney injury) (HCC) 01/26/2021   Anginal pain (HCC) 08/2018   a. 08/2018 - atyp c/p and neg trop; b. 08/2018 MV: EF 57%, inferolateral horizontal ST depression w/o ischemia or infarct.   Arthritis    Carotid arterial disease (HCC)    a. 01/2019 Carotid U/S: RICA 80-90, RCCA <50, RECA>50, LICA 80-99, LCCA>50, LECA>50; b. 01/2019 CT Head/Neck: RICA string sign in bulb - lumen <50mm. Sev prox RECA dzs. LICA 60 @ dist bulb. R vert 50-70, L vert 30-50.   Carotid bruit present 08/2018   left   Chewing tobacco nicotine dependence    Coronary artery disease    Essential hypertension 03/29/2017   GERD (gastroesophageal reflux disease)    Headache    migrraines   History of 2019 novel coronavirus disease (COVID-19) 08/04/2020   History of kidney stones    Hyperlipidemia    Sepsis secondary to UTI (HCC) 01/26/2021   Past Surgical History:  Procedure Laterality Date   ANTERIOR LAT LUMBAR FUSION Right 01/01/2013   Procedure: lumbar three-four extreme lateral interbody fusion;  Surgeon: Reinaldo Meeker, MD;  Location: [facility];  Service: Neurosurgery;  Laterality: Right;  Lumbar three-four extreme lateral interbody fusion     BACK SURGERY  08   COLONOSCOPY     CYSTOSCOPY WITH STENT PLACEMENT Left 01/26/2021   Procedure: CYSTOSCOPY WITH STENT PLACEMENT;  Surgeon: Sondra Come, MD;  Location:  [facility];  Service: Urology;  Laterality: Left;   CYSTOSCOPY/URETEROSCOPY/HOLMIUM LASER/STENT PLACEMENT Left 02/24/2021   Procedure: CYSTOSCOPY/URETEROSCOPY/HOLMIUM LASER/STENT EXCHANGE;  Surgeon: Sondra Come, MD;  Location: [facility] ORS;  Service: Urology;  Laterality: Left;   ENDARTERECTOMY Right 02/18/2019   Procedure: ENDARTERECTOMY CAROTID;  Surgeon: Annice Needy, MD;  Location: [facility] ORS;  Service: Vascular;   Laterality: Right;   ENDARTERECTOMY Left 06/11/2019   Procedure: ENDARTERECTOMY CAROTID;  Surgeon: Annice Needy, MD;  Location: [facility];  Service: Vascular;  Laterality: Left;   ESOPHAGOGASTRODUODENOSCOPY (EGD) WITH PROPOFOL N/A 07/05/2015   Procedure: ESOPHAGOGASTRODUODENOSCOPY (EGD) WITH PROPOFOL;  Surgeon: Midge Minium, MD;  Location: [facility];  Service: Endoscopy;  Laterality: N/A;   EXTRACORPOREAL SHOCK WAVE LITHOTRIPSY Right 09/11/2018   Procedure: EXTRACORPOREAL SHOCK WAVE LITHOTRIPSY (ESWL);  Surgeon: Riki Altes, MD;  Location: [facility];  Service: Urology;  Laterality: Right;   LUMBAR PERCUTANEOUS PEDICLE SCREW 1 LEVEL N/A 01/01/2013   Procedure: lumbar three-four percutaneous pedicle screws ;  Surgeon: Reinaldo Meeker, MD;  Location: [facility];  Service: Neurosurgery;  Laterality: N/A;  lumbar three-four percutaneous pedicle screws   SHOULDER ARTHROSCOPY WITH ROTATOR CUFF REPAIR Left 08/22/2020   Procedure: SHOULDER ARTHROSCOPY WITH ROTATOR CUFF REPAIR AND DEBRIDEMENT;  Surgeon: Lyndle Herrlich, MD;  Location: [facility];  Service: Orthopedics;  Laterality: Left;    Allergies  No Known Allergies  Home Medications    Prior to Admission medications   Medication Sig Start Date End Date Taking? Authorizing Provider  amLODipine (NORVASC) 10 MG tablet TAKE 1 TABLET (10 MG TOTAL) BY MOUTH DAILY. PLEASE CALL [phone] TO SCHEDULE YEARLY VISIT AND FOR FURTHER REFILLS (1ST ATTEMPT) 03/07/23   Erasmo Downer, MD  aspirin 81 MG chewable tablet Chew 81 mg by mouth daily.    [provider]  atorvastatin (LIPITOR) 80 MG tablet TAKE 1 TABLET BY MOUTH ONCE EVERY EVENING. *MAKE APPOINTMENT FOR FURTHER FILLS* 03/07/23   Erasmo Downer, MD  clopidogrel (PLAVIX) 75 MG tablet Take 1 tablet (75 mg total) by mouth daily with breakfast. 02/12/23   Bacigalupo, Marzella Schlein, MD  diclofenac Sodium (VOLTAREN) 1 % GEL Apply 1 application topically 4 (four) times daily as needed (pain).     [provider]  esomeprazole (NEXIUM) 40 MG capsule Take 1 capsule (40 mg total) by mouth daily at 12 noon. 02/18/23   Erasmo Downer, MD  ezetimibe (ZETIA) 10 MG tablet Take 1 tablet (10 mg total) by mouth daily. 02/12/23   Bacigalupo, Marzella Schlein, MD  gabapentin (NEURONTIN) 300 MG capsule Take 1 capsule (300 mg total) by mouth 3 (three) times daily. 01/29/23   Erasmo Downer, MD  losartan (COZAAR) 25 MG tablet Take 0.5 tablets (12.5 mg total) by mouth daily. 03/05/23   Furth, Cadence H, PA-C  Menthol, Topical Analgesic, (ICY HOT EX) Apply 1 application  topically daily as needed (pain).    [provider]  metFORMIN (GLUCOPHAGE) 500 MG tablet TAKE 1 TABLET BY MOUTH 2 TIMES DAILY WITH A MEAL. 02/26/23   Erasmo Downer, MD  Multiple Vitamins-Minerals (AIRBORNE PO) Take 1 tablet by mouth daily.    [provider]  tadalafil (CIALIS) 5 MG tablet Take 1-4 tablets (5-20 mg total) by mouth daily as needed for erectile dysfunction. 09/28/21   Sondra Come, MD  tamsulosin (FLOMAX) 0.4 MG CAPS capsule TAKE 1 CAPSULE BY MOUTH ONCE DAILY 30 MINUTES AFTER LARGEST MEAL. 06/15/22   Carman Ching, PA-C  traMADol Janean Sark)  50 MG tablet Take 1 tablet by mouth every 6 (six) hours as needed. 02/25/23   [provider]    Physical Exam    Vital Signs:  [name] does not have vital signs available for review today.  Given telephonic nature of communication, physical exam is limited. AAOx3. NAD. Normal affect.  Speech and respirations are unlabored.  Accessory Clinical Findings    None  Assessment & Plan    1.  Preoperative Cardiovascular Risk Assessment: -Patient's RCRI score is 0.9%  The patient affirms he has been doing well without any new cardiac symptoms. They are able to achieve 5 METS without cardiac limitations. Therefore, based on ACC/AHA guidelines, the patient would be at acceptable risk for the planned procedure without further  cardiovascular testing. The patient was advised that if he develops new symptoms prior to surgery to contact our office to arrange for a follow-up visit, and he verbalized understanding.   The patient was advised that if he develops new symptoms prior to surgery to contact our office to arrange for a follow-up visit, and he verbalized understanding.  Plavix and aspirin both managed by vascular surgery   A copy of this note will be routed to requesting surgeon.  Time:   Today, I have spent 6 minutes with the patient with telehealth technology discussing medical history, symptoms, and management plan.     Napoleon Form, Leodis Rains, NP  05/17/2023, 7:54 AM

## 2023-05-17 NOTE — Telephone Encounter (Signed)
Patient cleared via virtual visit today

## 2023-05-17 NOTE — Telephone Encounter (Signed)
   Pre-operative Risk Assessment    Patient Name: Shawn Sherman  DOB: 1955/09/02 MRN: [id number]      Request for Surgical Clearance    Procedure:   Right total hip replacment (Anterior)  Date of Surgery:  Clearance 05/23/23                                 Surgeon:  Dr. Cassell Smiles Surgeon's Group or Practice Name:  [facility] Phone number:  [phone] Fax number:  [phone]   Type of Clearance Requested:   - Pharmacy:  Hold Aspirin directions on starting and stopping   Type of Anesthesia:  Not Indicated   Additional requests/questions:    Queen Sl[place]   05/17/2023, 2:16 PM

## 2023-05-22 ENCOUNTER — Other Ambulatory Visit
Admission: RE | Admit: 2023-05-22 | Discharge: 2023-05-22 | Disposition: A | Discharge status: Home / Self Care | Payer: Medicare HMO | Attending: Orthopedic Surgery | Admitting: Orthopedic Surgery

## 2023-05-22 DIAGNOSIS — Z01812 Encounter for preprocedural laboratory examination: Secondary | ICD-10-CM | POA: Insufficient documentation

## 2023-05-22 DIAGNOSIS — E119 Type 2 diabetes mellitus without complications: Secondary | ICD-10-CM | POA: Diagnosis not present

## 2023-05-22 DIAGNOSIS — Z0189 Encounter for other specified special examinations: Secondary | ICD-10-CM | POA: Diagnosis not present

## 2023-05-22 LAB — CBC WITH DIFFERENTIAL/PLATELET
Abs Immature Granulocytes: 0.03 10*3/uL (ref 0.00–0.07)
Basophils Absolute: 0.1 10*3/uL (ref 0.0–0.1)
Basophils Relative: 1 %
Eosinophils Absolute: 0.1 10*3/uL (ref 0.0–0.5)
Eosinophils Relative: 2 %
HCT: 46 % (ref 39.0–52.0)
Hemoglobin: 15.2 g/dL (ref 13.0–17.0)
Immature Granulocytes: 0 %
Lymphocytes Relative: 13 %
Lymphs Abs: 1.1 10*3/uL (ref 0.7–4.0)
MCH: 27.4 pg (ref 26.0–34.0)
MCHC: 33 g/dL (ref 30.0–36.0)
MCV: 83 fL (ref 80.0–100.0)
Monocytes Absolute: 0.6 10*3/uL (ref 0.1–1.0)
Monocytes Relative: 6 %
Neutro Abs: 6.9 10*3/uL (ref 1.7–7.7)
Neutrophils Relative %: 78 %
Platelets: 229 10*3/uL (ref 150–400)
RBC: 5.54 MIL/uL (ref 4.22–5.81)
RDW: 14.1 % (ref 11.5–15.5)
WBC: 8.8 10*3/uL (ref 4.0–10.5)
nRBC: 0 % (ref 0.0–0.2)

## 2023-05-22 LAB — COMPREHENSIVE METABOLIC PANEL
ALT: 23 U/L (ref 0–44)
AST: 21 U/L (ref 15–41)
Albumin: 4 g/dL (ref 3.5–5.0)
Alkaline Phosphatase: 95 U/L (ref 38–126)
Anion gap: 11 (ref 5–15)
BUN: 13 mg/dL (ref 8–23)
CO2: 23 mmol/L (ref 22–32)
Calcium: 9.1 mg/dL (ref 8.9–10.3)
Chloride: 104 mmol/L (ref 98–111)
Creatinine, Ser: 0.78 mg/dL (ref 0.61–1.24)
GFR, Estimated: >60 mL/min (ref >60)
Glucose, Bld: 194 mg/dL — ABNORMAL HIGH (ref 70–99)
Potassium: 3.7 mmol/L (ref 3.5–5.1)
Sodium: 138 mmol/L (ref 135–145)
Total Bilirubin: 0.5 mg/dL (ref <1.2)
Total Protein: 7 g/dL (ref 6.5–8.1)

## 2023-05-23 DIAGNOSIS — R262 Difficulty in walking, not elsewhere classified: Secondary | ICD-10-CM | POA: Diagnosis not present

## 2023-05-23 DIAGNOSIS — M25551 Pain in right hip: Secondary | ICD-10-CM | POA: Diagnosis not present

## 2023-05-23 DIAGNOSIS — M1611 Unilateral primary osteoarthritis, right hip: Secondary | ICD-10-CM | POA: Diagnosis not present

## 2023-05-28 DIAGNOSIS — R262 Difficulty in walking, not elsewhere classified: Secondary | ICD-10-CM | POA: Diagnosis not present

## 2023-05-28 DIAGNOSIS — M25551 Pain in right hip: Secondary | ICD-10-CM | POA: Diagnosis not present

## 2023-06-05 DIAGNOSIS — M25551 Pain in right hip: Secondary | ICD-10-CM | POA: Diagnosis not present

## 2023-06-05 DIAGNOSIS — R262 Difficulty in walking, not elsewhere classified: Secondary | ICD-10-CM | POA: Diagnosis not present

## 2023-06-11 DIAGNOSIS — R262 Difficulty in walking, not elsewhere classified: Secondary | ICD-10-CM | POA: Diagnosis not present

## 2023-06-11 DIAGNOSIS — M25551 Pain in right hip: Secondary | ICD-10-CM | POA: Diagnosis not present

## 2023-06-13 DIAGNOSIS — M25551 Pain in right hip: Secondary | ICD-10-CM | POA: Diagnosis not present

## 2023-06-13 DIAGNOSIS — R262 Difficulty in walking, not elsewhere classified: Secondary | ICD-10-CM | POA: Diagnosis not present

## 2023-06-18 DIAGNOSIS — M25551 Pain in right hip: Secondary | ICD-10-CM | POA: Diagnosis not present

## 2023-06-18 DIAGNOSIS — R262 Difficulty in walking, not elsewhere classified: Secondary | ICD-10-CM | POA: Diagnosis not present

## 2023-06-20 DIAGNOSIS — M25551 Pain in right hip: Secondary | ICD-10-CM | POA: Diagnosis not present

## 2023-06-20 DIAGNOSIS — R262 Difficulty in walking, not elsewhere classified: Secondary | ICD-10-CM | POA: Diagnosis not present

## 2023-06-25 DIAGNOSIS — M25551 Pain in right hip: Secondary | ICD-10-CM | POA: Diagnosis not present

## 2023-06-25 DIAGNOSIS — R262 Difficulty in walking, not elsewhere classified: Secondary | ICD-10-CM | POA: Diagnosis not present

## 2023-06-27 DIAGNOSIS — R262 Difficulty in walking, not elsewhere classified: Secondary | ICD-10-CM | POA: Diagnosis not present

## 2023-06-27 DIAGNOSIS — M25551 Pain in right hip: Secondary | ICD-10-CM | POA: Diagnosis not present

## 2023-07-02 DIAGNOSIS — R262 Difficulty in walking, not elsewhere classified: Secondary | ICD-10-CM | POA: Diagnosis not present

## 2023-07-02 DIAGNOSIS — M25551 Pain in right hip: Secondary | ICD-10-CM | POA: Diagnosis not present

## 2023-07-07 DIAGNOSIS — H2513 Age-related nuclear cataract, bilateral: Secondary | ICD-10-CM | POA: Diagnosis not present

## 2023-07-07 DIAGNOSIS — H524 Presbyopia: Secondary | ICD-10-CM | POA: Diagnosis not present

## 2023-07-07 DIAGNOSIS — E119 Type 2 diabetes mellitus without complications: Secondary | ICD-10-CM | POA: Diagnosis not present

## 2023-07-07 DIAGNOSIS — D3131 Benign neoplasm of right choroid: Secondary | ICD-10-CM | POA: Diagnosis not present

## 2023-07-07 LAB — HM DIABETES EYE EXAM

## 2023-07-11 DIAGNOSIS — Z0100 Encounter for examination of eyes and vision without abnormal findings: Secondary | ICD-10-CM | POA: Diagnosis not present

## 2023-07-15 NOTE — Telephone Encounter (Signed)
 Patient contacted to see if he was ready to schedule his colonoscopy.  He said he will need to wait about 6 more weeks.  Informed him that I will call him back in 6 weeks.  Thanks,  [place], [place]

## 2023-07-29 ENCOUNTER — Encounter: Payer: Medicare HMO | Admitting: Family Medicine

## 2023-08-08 ENCOUNTER — Other Ambulatory Visit: Payer: Self-pay | Admitting: Family Medicine

## 2023-08-08 DIAGNOSIS — E782 Mixed hyperlipidemia: Secondary | ICD-10-CM

## 2023-08-08 DIAGNOSIS — I10 Essential (primary) hypertension: Secondary | ICD-10-CM

## 2023-08-12 ENCOUNTER — Encounter: Payer: Self-pay | Admitting: Family Medicine

## 2023-08-12 ENCOUNTER — Ambulatory Visit: Payer: Medicare HMO | Admitting: Family Medicine

## 2023-08-12 VITALS — BP 122/79 | HR 95 | Ht 70.0 in | Wt 183.2 lb

## 2023-08-12 DIAGNOSIS — I152 Hypertension secondary to endocrine disorders: Secondary | ICD-10-CM

## 2023-08-12 DIAGNOSIS — I10 Essential (primary) hypertension: Secondary | ICD-10-CM

## 2023-08-12 DIAGNOSIS — E785 Hyperlipidemia, unspecified: Secondary | ICD-10-CM

## 2023-08-12 DIAGNOSIS — K219 Gastro-esophageal reflux disease without esophagitis: Secondary | ICD-10-CM

## 2023-08-12 DIAGNOSIS — E1169 Type 2 diabetes mellitus with other specified complication: Secondary | ICD-10-CM

## 2023-08-12 DIAGNOSIS — Z0001 Encounter for general adult medical examination with abnormal findings: Secondary | ICD-10-CM

## 2023-08-12 DIAGNOSIS — E1159 Type 2 diabetes mellitus with other circulatory complications: Secondary | ICD-10-CM

## 2023-08-12 DIAGNOSIS — Z0000 Encounter for general adult medical examination without abnormal findings: Secondary | ICD-10-CM | POA: Diagnosis not present

## 2023-08-12 MED ORDER — AMLODIPINE BESYLATE 10 MG PO TABS: 10.0000 mg | ORAL_TABLET | Freq: Every day | ORAL | 1 refills | Status: DC

## 2023-08-12 NOTE — Patient Instructions (Signed)
Consider asking at the pharmacy about the tetanus shot (TDAP) ?

## 2023-08-12 NOTE — Assessment & Plan Note (Signed)
Blood pressure well-controlled on amlodipine 10 mg daily and losartan 12.5 mg daily, which also provides kidney protection. - Continue amlodipine 10 mg daily - Continue losartan 12.5 mg daily

## 2023-08-12 NOTE — Assessment & Plan Note (Signed)
On atorvastatin 80 mg daily and ezetimibe 10 mg daily for cholesterol management. - Continue atorvastatin 80 mg daily - Continue ezetimibe 10 mg daily

## 2023-08-12 NOTE — Assessment & Plan Note (Signed)
On metformin 500 mg twice daily. Previous A1c improved from 10 to 7. Awaiting current lab results. - Continue metformin 500 mg twice daily - Order A1c test

## 2023-08-12 NOTE — Progress Notes (Signed)
Complete physical exam   Patient: Shawn Sherman   DOB: 1955-07-14   68 y.o. Male  MRN: [id number] Visit Date: 08/12/2023  Today's healthcare provider: Shirlee Latch, MD   Chief Complaint  Patient presents with   Annual Exam    AWV completed 03/18/23 Diet - Diabetic Exercise - Walking daily for 20 minutes to one hour Feeling - well Sleeping - well Concerns - none Eye Exam - 3 to 4 weeks ago at [facility] in [place]   Subjective    Shawn Sherman is a 68 y.o. male who presents today for a complete physical exam.    Discussed the use of AI scribe software for clinical note transcription with the patient, who gave verbal consent to proceed.  History of Present Illness   The patient, with a history of diabetes, hypertension, hyperlipidemia, and recent hip replacement surgery, presents for a routine physical examination. They report that the hip pain, for which they had been taking gabapentin, has resolved following the surgery. They express a desire to discontinue gabapentin, which they have been taking twice daily. They also mention that they have been adhering to their medication regimen, which includes amlodipine, losartan, Lipitor, Zetia, aspirin, Plavix, Nexium, and metformin. The patient is due for colon cancer screening and has been waiting for a callback from the screening center. They also report that they had an eye exam last month.        Last depression screening scores    03/18/2023    2:14 PM 03/14/2022   10:34 AM 03/14/2022   10:33 AM  PHQ 2/9 Scores  PHQ - 2 Score 0 0 0  PHQ- 9 Score  0 0   Last fall risk screening    03/18/2023    2:10 PM  Fall Risk   Falls in the past year? 0  Number falls in past yr: 0  Injury with Fall? 0  Risk for fall due to : No Fall Risks  Follow up Falls prevention discussed;Education provided        Medications: Outpatient Medications Prior to Visit  Medication Sig   aspirin 81 MG chewable tablet Chew 81  mg by mouth daily.   atorvastatin (LIPITOR) 80 MG tablet TAKE 1 TABLET BY MOUTH ONCE EVERY EVENING. *MAKE APPOINTMENT FOR FURTHER FILLS*   clopidogrel (PLAVIX) 75 MG tablet Take 1 tablet (75 mg total) by mouth daily with breakfast.   diclofenac Sodium (VOLTAREN) 1 % GEL Apply 1 application topically 4 (four) times daily as needed (pain).   esomeprazole (NEXIUM) 40 MG capsule Take 1 capsule (40 mg total) by mouth daily at 12 noon.   ezetimibe (ZETIA) 10 MG tablet Take 1 tablet (10 mg total) by mouth daily.   losartan (COZAAR) 25 MG tablet Take 0.5 tablets (12.5 mg total) by mouth daily.   Menthol, Topical Analgesic, (ICY HOT EX) Apply 1 application  topically daily as needed (pain).   metFORMIN (GLUCOPHAGE) 500 MG tablet TAKE 1 TABLET BY MOUTH 2 TIMES DAILY WITH A MEAL.   Multiple Vitamins-Minerals (AIRBORNE PO) Take 1 tablet by mouth daily.   tadalafil (CIALIS) 5 MG tablet Take 1-4 tablets (5-20 mg total) by mouth daily as needed for erectile dysfunction.   [DISCONTINUED] amLODipine (NORVASC) 10 MG tablet TAKE 1 TABLET BY MOUTH DAILY. PLEASE CALL [phone] TO SCHEDULE YEARLY VISIT AND FOR FURTHER REFILLS (1ST ATTEMPT)   [DISCONTINUED] gabapentin (NEURONTIN) 300 MG capsule Take 1 capsule (300 mg total) by mouth 3 (three)  times daily. (Patient not taking: Reported on 08/12/2023)   [DISCONTINUED] tamsulosin (FLOMAX) 0.4 MG CAPS capsule TAKE 1 CAPSULE BY MOUTH ONCE DAILY 30 MINUTES AFTER LARGEST MEAL. (Patient not taking: Reported on 08/12/2023)   [DISCONTINUED] traMADol (ULTRAM) 50 MG tablet Take 1 tablet by mouth every 6 (six) hours as needed. (Patient not taking: Reported on 08/12/2023)   No facility-administered medications prior to visit.    Review of Systems    Objective    BP 122/79 (BP Location: Left Arm, Patient Position: Sitting, Cuff Size: Normal)   Pulse 95   Ht 5\' 10"  (1.778 m)   Wt 183 lb 3.2 oz (83.1 kg)   SpO2 99%   BMI 26.29 kg/m    Physical Exam Vitals reviewed.   Constitutional:      General: He is not in acute distress.    Appearance: Normal appearance. He is well-developed. He is not diaphoretic.  HENT:     Head: Normocephalic and atraumatic.     Right Ear: Tympanic membrane, ear canal and external ear normal.     Left Ear: Tympanic membrane, ear canal and external ear normal.     Nose: Nose normal.     Mouth/Throat:     Mouth: Mucous membranes are moist.     Pharynx: Oropharynx is clear. No oropharyngeal exudate.  Eyes:     General: No scleral icterus.    Conjunctiva/sclera: Conjunctivae normal.     Pupils: Pupils are equal, round, and reactive to light.  Neck:     Thyroid: No thyromegaly.  Cardiovascular:     Rate and Rhythm: Normal rate and regular rhythm.     Heart sounds: Normal heart sounds. No murmur heard. Pulmonary:     Effort: Pulmonary effort is normal. No respiratory distress.     Breath sounds: Normal breath sounds. No wheezing or rales.  Abdominal:     General: There is no distension.     Palpations: Abdomen is soft.     Tenderness: There is no abdominal tenderness.  Musculoskeletal:        General: No deformity.     Cervical back: Neck supple.     Right lower leg: No edema.     Left lower leg: No edema.  Lymphadenopathy:     Cervical: No cervical adenopathy.  Skin:    General: Skin is warm and dry.     Findings: No rash.  Neurological:     Mental Status: He is alert and oriented to person, place, and time. Mental status is at baseline.     Gait: Gait normal.  Psychiatric:        Mood and Affect: Mood normal.        Behavior: Behavior normal.        Thought Content: Thought content normal.      No results found for any visits on 08/12/23.  Assessment & Plan    Routine Health Maintenance and Physical Exam  Exercise Activities and Dietary recommendations  Goals   None     Immunization History  Administered Date(s) Administered   Fluad Trivalent(High Dose 65+) 05/06/2023   PFIZER(Purple  Top)SARS-COV-2 Vaccination 02/06/2020, 03/05/2020   PNEUMOCOCCAL CONJUGATE-20 10/09/2021   Td 06/25/2011   Tdap 06/25/2011    Health Maintenance  Topic Date Due   OPHTHALMOLOGY EXAM  Never done   Zoster Vaccines- Shingrix (1 of 2) Never done   Colonoscopy  03/30/2018   COVID-19 Vaccine (3 - Pfizer risk series) 04/02/2020   DTaP/Tdap/Td (3 - Td  or Tdap) 06/24/2021   HEMOGLOBIN A1C  11/04/2023   Diabetic kidney evaluation - Urine ACR  01/29/2024   FOOT EXAM  01/29/2024   Medicare Annual Wellness (AWV)  03/17/2024   Diabetic kidney evaluation - eGFR measurement  05/21/2024   Pneumonia Vaccine 47+ Years old  Completed   INFLUENZA VACCINE  Completed   Hepatitis C Screening  Completed   HPV VACCINES  Aged Out    Discussed health benefits of physical activity, and encouraged him to engage in regular exercise appropriate for his age and condition.  Problem List Items Addressed This Visit       Cardiovascular and Mediastinum   Hypertension associated with diabetes (HCC)   Relevant Medications   amLODipine (NORVASC) 10 MG tablet   Other Relevant Orders   Hemoglobin A1c   Comprehensive metabolic panel   Lipid panel     Endocrine   Hyperlipidemia associated with type 2 diabetes mellitus (HCC)   Relevant Medications   amLODipine (NORVASC) 10 MG tablet   Other Relevant Orders   Hemoglobin A1c   Comprehensive metabolic panel   Lipid panel   T2DM (type 2 diabetes mellitus) (HCC)   Relevant Orders   Hemoglobin A1c   Comprehensive metabolic panel   Lipid panel   Other Visit Diagnoses       Encounter for annual physical exam    -  Primary   Relevant Orders   Hemoglobin A1c   Comprehensive metabolic panel   Lipid panel     Essential hypertension       Relevant Medications   amLODipine (NORVASC) 10 MG tablet       Assessment and Plan    Postoperative Hip Replacement Significant improvement in hip pain post-surgery. No longer requires gabapentin. - Taper gabapentin:  reduce to 300 mg at bedtime for 1-2 weeks, then discontinue  Eye Allergies Reports itching eyes and presence of milia, likely due to allergies. - Consider over-the-counter allergy eye drops if needed  General Health Maintenance Routine physical examination. Discussed colon cancer screening follow-up, tetanus shot, and recent eye exam. - Order labs: A1c, cholesterol, kidney and liver function - Provide reminder for tetanus shot at pharmacy - Provide contact number for colon cancer screening follow-up - Document recent eye exam at [facility]  Follow-up - Schedule follow-up appointment in six months.        Return in about 6 months (around 02/09/2024) for chronic disease f/u.     Shirlee Latch, MD  [facility] [phone] (phone) [phone] (fax)  [facility]

## 2023-08-12 NOTE — Assessment & Plan Note (Signed)
Continues to use Nexium for reflux management. - Continue Nexium as needed

## 2023-08-13 ENCOUNTER — Other Ambulatory Visit: Payer: Self-pay

## 2023-08-13 ENCOUNTER — Encounter: Payer: Self-pay | Admitting: Family Medicine

## 2023-08-13 DIAGNOSIS — E1169 Type 2 diabetes mellitus with other specified complication: Secondary | ICD-10-CM

## 2023-08-13 LAB — COMPREHENSIVE METABOLIC PANEL
ALT: 17 [IU]/L (ref 0–44)
AST: 16 [IU]/L (ref 0–40)
Albumin: 4.3 g/dL (ref 3.9–4.9)
Alkaline Phosphatase: 125 [IU]/L — ABNORMAL HIGH (ref 44–121)
BUN/Creatinine Ratio: 11 (ref 10–24)
BUN: 9 mg/dL (ref 8–27)
Bilirubin Total: 0.7 mg/dL (ref 0.0–1.2)
CO2: 23 mmol/L (ref 20–29)
Calcium: 9.4 mg/dL (ref 8.6–10.2)
Chloride: 103 mmol/L (ref 96–106)
Creatinine, Ser: 0.8 mg/dL (ref 0.76–1.27)
Globulin, Total: 2.2 g/dL (ref 1.5–4.5)
Glucose: 158 mg/dL — ABNORMAL HIGH (ref 70–99)
Potassium: 4.2 mmol/L (ref 3.5–5.2)
Sodium: 141 mmol/L (ref 134–144)
Total Protein: 6.5 g/dL (ref 6.0–8.5)
eGFR: 97 mL/min/{1.73_m2} (ref >59)

## 2023-08-13 LAB — LIPID PANEL
Chol/HDL Ratio: 3.4 {ratio} (ref 0.0–5.0)
Cholesterol, Total: 131 mg/dL (ref 100–199)
HDL: 39 mg/dL — ABNORMAL LOW (ref >39)
LDL Chol Calc (NIH): 64 mg/dL (ref 0–99)
Triglycerides: 161 mg/dL — ABNORMAL HIGH (ref 0–149)
VLDL Cholesterol Cal: 28 mg/dL (ref 5–40)

## 2023-08-13 LAB — HEMOGLOBIN A1C
Est. average glucose Bld gHb Est-mCnc: 171 mg/dL
Hgb A1c MFr Bld: 7.6 % — ABNORMAL HIGH (ref 4.8–5.6)

## 2023-08-13 MED ORDER — EMPAGLIFLOZIN 10 MG PO TABS: 10.0000 mg | ORAL_TABLET | Freq: Every day | ORAL | 1 refills | Status: DC

## 2023-08-15 ENCOUNTER — Other Ambulatory Visit: Payer: Self-pay

## 2023-08-15 DIAGNOSIS — E1169 Type 2 diabetes mellitus with other specified complication: Secondary | ICD-10-CM

## 2023-08-15 MED ORDER — GLIPIZIDE ER 5 MG PO TB24: 5.0000 mg | ORAL_TABLET | Freq: Every day | ORAL | 1 refills | Status: DC

## 2023-08-21 ENCOUNTER — Other Ambulatory Visit: Payer: Self-pay | Admitting: Family Medicine

## 2023-08-21 DIAGNOSIS — E1169 Type 2 diabetes mellitus with other specified complication: Secondary | ICD-10-CM

## 2023-08-21 NOTE — Telephone Encounter (Signed)
Requested Prescriptions  Pending Prescriptions Disp Refills   metFORMIN (GLUCOPHAGE) 500 MG tablet [Pharmacy Med Name: METFORMIN HCL 500 MG TABLET] 180 tablet 1    Sig: TAKE 1 TABLET BY MOUTH TWICE A DAY WITH FOOD     Endocrinology:  Diabetes - Biguanides Failed - 08/21/2023  3:29 PM      Failed - B12 Level in normal range and within 720 days    No results found for: "VITAMINB12"       Passed - Cr in normal range and within 360 days    Creatinine, Ser  Date Value Ref Range Status  08/12/2023 0.80 0.76 - 1.27 mg/dL Final         Passed - HBA1C is between 0 and 7.9 and within 180 days    Hgb A1c MFr Bld  Date Value Ref Range Status  08/12/2023 7.6 (H) 4.8 - 5.6 % Final    Comment:             Prediabetes: 5.7 - 6.4          Diabetes: >6.4          Glycemic control for adults with diabetes: <7.0          Passed - eGFR in normal range and within 360 days    GFR calc Af Amer  Date Value Ref Range Status  04/22/2020 113 >59 mL/min/1.73 Final    Comment:    **In accordance with recommendations from the NKF-ASN Task force,**   [facility] is in the process of updating its eGFR calculation to the   2021 CKD-EPI creatinine equation that estimates kidney function   without a race variable.    GFR, Estimated  Date Value Ref Range Status  05/22/2023 >60 >60 mL/min Final    Comment:    (NOTE) Calculated using the CKD-EPI Creatinine Equation (2021)    eGFR  Date Value Ref Range Status  08/12/2023 97 >59 mL/min/1.73 Final         Passed - Valid encounter within last 6 months    Recent Outpatient Visits           3 months ago Type 2 diabetes mellitus with other specified complication, without long-term current use of insulin (HCC)   [place] [facility] [place], Marzella Schlein, MD   6 months ago Type 2 diabetes mellitus with other specified complication, without long-term current use of insulin [facility])   [place] [facility] [place], Marzella Schlein, MD    1 year ago Type 2 diabetes mellitus with other specified complication, without long-term current use of insulin [facility])   [place] [facility] [place], Marzella Schlein, MD   2 years ago Urinary tract obstruction due to kidney stone   [place] [facility] [place], Jodell Cipro, PA-C   3 years ago Right hip pain    [facility] [place], Jodell Cipro, [place]       Future Appointments             In 5 months Bacigalupo, Marzella Schlein, MD [facility]            Passed - CBC within normal limits and completed in the last 12 months    WBC  Date Value Ref Range Status  05/22/2023 8.8 4.0 - 10.5 K/uL Final   RBC  Date Value Ref Range Status  05/22/2023 5.54 4.22 - 5.81 MIL/uL Final   Hemoglobin  Date Value Ref Range  Status  05/22/2023 15.2 13.0 - 17.0 g/dL Final  16/04/9603 54.0 13.0 - 17.7 g/dL Final   HCT  Date Value Ref Range Status  05/22/2023 46.0 39.0 - 52.0 % Final   Hematocrit  Date Value Ref Range Status  02/09/2021 37.6 37.5 - 51.0 % Final   MCHC  Date Value Ref Range Status  05/22/2023 33.0 30.0 - 36.0 g/dL Final   [facility]  Date Value Ref Range Status  05/22/2023 27.4 26.0 - 34.0 pg Final   MCV  Date Value Ref Range Status  05/22/2023 83.0 80.0 - 100.0 fL Final  02/09/2021 80 79 - 97 fL Final   No results found for: "PLTCOUNTKUC", "LABPLAT", "POCPLA" RDW  Date Value Ref Range Status  05/22/2023 14.1 11.5 - 15.5 % Final  02/09/2021 13.0 11.6 - 15.4 % Final

## 2023-08-26 ENCOUNTER — Telehealth: Payer: Self-pay | Admitting: Family Medicine

## 2023-08-26 ENCOUNTER — Telehealth: Payer: Self-pay

## 2023-08-26 DIAGNOSIS — Z1211 Encounter for screening for malignant neoplasm of colon: Secondary | ICD-10-CM

## 2023-08-26 NOTE — Telephone Encounter (Signed)
Received blood thinner information request from [facility] through fax.  Placed in Dr. Senaida Lange box.

## 2023-08-26 NOTE — Telephone Encounter (Signed)
   Pre-operative Risk Assessment    Patient Name: Shawn Sherman  DOB: Jun 23, 1956 MRN: [id number]   Date of last office visit: 03/05/23 Date of next office visit: n/a    Request for Surgical Clearance    Procedure:   Colonoscopy   Date of Surgery:  Clearance TBD                                 Surgeon:  Dr. Beryle Flock  Surgeon's Group or Practice Name:  [facility]  Phone number:  [phone] Fax number:  [phone]   Type of Clearance Requested:   - Medical  - Pharmacy:  Hold Aspirin and Clopidogrel (Plavix) Not indicated    Type of Anesthesia:  General    Additional requests/questions:    [name]   08/26/2023, 12:08 PM

## 2023-08-26 NOTE — Telephone Encounter (Signed)
Gastroenterology Pre-Procedure Review  Request Date: Date Pending Requesting Physician: Dr. Servando Snare  PATIENT REVIEW QUESTIONS: The patient responded to the following health history questions as indicated:    1. Are you having any GI issues? no 2. Do you have a personal history of Polyps? no 3. Do you have a family history of Colon Cancer or Polyps? no 4. Diabetes Mellitus? yes (takes Glipizide stop 1 day prior, stop metformin 2 days prior) 5. Joint replacements in the past 12 months?yes (r. Hip surgery October 2024) 6. Major health problems in the past 3 months?no 7. Any artificial heart valves, MVP, or defibrillator? Carotid Stenosis cardiologist Dr. Kirke Corin Clearance sent to CV Preop    MEDICATIONS & ALLERGIES:    Patient reports the following regarding taking any anticoagulation/antiplatelet therapy:   Plavix, Coumadin, Eliquis, Xarelto, Lovenox, Pradaxa, Brilinta, or Effient? yes (patient does take Plavix rx written by PCP Dr. Beryle Flock) Aspirin? yes (81 mg daily)  Patient confirms/reports the following medications:  Current Outpatient Medications  Medication Sig Dispense Refill   amLODipine (NORVASC) 10 MG tablet Take 1 tablet (10 mg total) by mouth daily. 90 tablet 1   aspirin 81 MG chewable tablet Chew 81 mg by mouth daily.     atorvastatin (LIPITOR) 80 MG tablet TAKE 1 TABLET BY MOUTH ONCE EVERY EVENING. *MAKE APPOINTMENT FOR FURTHER FILLS* 90 tablet 1   clopidogrel (PLAVIX) 75 MG tablet Take 1 tablet (75 mg total) by mouth daily with breakfast. 90 tablet 3   diclofenac Sodium (VOLTAREN) 1 % GEL Apply 1 application topically 4 (four) times daily as needed (pain).     esomeprazole (NEXIUM) 40 MG capsule Take 1 capsule (40 mg total) by mouth daily at 12 noon. 90 capsule 2   ezetimibe (ZETIA) 10 MG tablet Take 1 tablet (10 mg total) by mouth daily. 90 tablet 3   glipiZIDE (GLUCOTROL XL) 5 MG 24 hr tablet Take 1 tablet (5 mg total) by mouth daily with breakfast. 90 tablet 1   losartan  (COZAAR) 25 MG tablet Take 0.5 tablets (12.5 mg total) by mouth daily. 45 tablet 3   Menthol, Topical Analgesic, (ICY HOT EX) Apply 1 application  topically daily as needed (pain).     metFORMIN (GLUCOPHAGE) 500 MG tablet TAKE 1 TABLET BY MOUTH TWICE A DAY WITH FOOD 180 tablet 1   Multiple Vitamins-Minerals (AIRBORNE PO) Take 1 tablet by mouth daily.     tadalafil (CIALIS) 5 MG tablet Take 1-4 tablets (5-20 mg total) by mouth daily as needed for erectile dysfunction. 30 tablet 11   No current facility-administered medications for this visit.    Patient confirms/reports the following allergies:  No Known Allergies  No orders of the defined types were placed in this encounter.   AUTHORIZATION INFORMATION Primary Insurance: 1D#: Group #:  Secondary Insurance: 1D#: Group #:  SCHEDULE INFORMATION: Date: TBD Time: Location: [facility]

## 2023-08-27 NOTE — Telephone Encounter (Signed)
Was complated and place in fax box today

## 2023-08-28 NOTE — Telephone Encounter (Signed)
   Name: CORNEL WERBER  DOB: Feb 16, 1956  MRN: [id number]  Primary Cardiologist: Lorine Bears, MD  Chart reviewed as part of pre-operative protocol coverage. Because of Giancarlo Askren Busbee's past medical history and time since last visit, he will require a follow-up in-office visit in order to better assess preoperative cardiovascular risk.  Pre-op covering staff: - Please schedule appointment and call patient to inform them. If patient already had an upcoming appointment within acceptable timeframe, please add "pre-op clearance" to the appointment notes so provider is aware. - Please contact requesting surgeon's office via preferred method (i.e, phone, fax) to inform them of need for appointment prior to surgery.  Patient can hold Plavix x 5 days prior to colonoscopy.  Would prefer aspirin to be continued throughout.  Sharlene Dory, PA-C  08/28/2023, 10:03 AM

## 2023-08-29 ENCOUNTER — Telehealth: Payer: Self-pay | Admitting: *Deleted

## 2023-08-29 NOTE — Telephone Encounter (Signed)
I s/w the opt and apologized we needed to reschedule in office appt as was scheduled with EP in error. Shawn Sherman states he wants the appt to be in the [place] office where he see's Dr. Kirke Corin. Shawn Sherman has been scheduled in [facility] 10/03/23 with Cadence Fransico Michael, PAC. Shawn Sherman said he is fine with appt at the end of the month, said it's just a colonoscopy and not in a rush.   I will update all parties involved.

## 2023-08-29 NOTE — Telephone Encounter (Signed)
I s/w the opt and apologized we needed to reschedule in office appt as was scheduled with EP in error. Pt states he wants the appt to be in the [place] office where he see's Dr. Kirke Corin. Pt has been scheduled in [facility] 10/03/23 with Cadence Fransico Michael, PAC. Pt said he is fine with appt at the end of the month, said it's just a colonoscopy and not in a rush.   I will update all parties involved.

## 2023-08-29 NOTE — Progress Notes (Unsigned)
Cardiology Office Note:  .   Date:  08/29/2023  ID:  Shawn Sherman, DOB 02-11-1956, MRN [id number] PCP: Erasmo Downer, MD  [facility] Providers Cardiologist:  Lorine Bears, MD {  History of Present Illness: .   Shawn Sherman is a 68 y.o. male w/PMHx of HTN, HLD, smoker, PVD (s/p bilateral carotid endarterectomies).  Saw cards tem last in clinic 03/05/23, pending hip surgery, reported walk up a flight of stairs and 1-2 blocks. Patient denies chest pain, shortness of breath. Did get some opcc edema and reported rare palpitations, though neither overly symptomatic or bothersome.  He was over due for [facility] follow up Felt reasonable cardiac candidate for his hip surgery (3.8% risk score)  Had another visit with cards via telehealth again for pre-op visit 05/17/23 again for hip surgery, reported no new cardiac symptoms or concerns, active though using a cane 2/2 pain Risk assessment this visit 0/9%, no symptoms to suggest clinical change and felt an acceptable risk for hip surgery  Needs clearance again, this time for a colonoscopy  Today's visit is scheduled as pre-op evaluation RCRI score is one (0.9%)  ROS:   He is on ASA/plavix for his carotid disease > will defer to his [facility] team/PMD *** symptoms *** activity   Studies Reviewed: Marland Kitchen    EKG done today and reviewed by myself:  ***  04/03/23: Carotid US Summary:  Right Carotid: There was no evidence of thrombus, dissection,  atherosclerotic                plaque or stenosis in the cervical carotid system.   Left Carotid: There was no evidence of thrombus, dissection,  atherosclerotic               plaque or stenosis in the cervical carotid system.   Vertebrals:  Bilateral vertebral arteries demonstrate antegrade flow.  Subclavians: Normal flow hemodynamics were seen in bilateral subclavian               arteries.   US Carotids 2022 Summary:  Right Carotid: Velocities in the right ICA are consistent with a  1-39%  stenosis.                Widely patent ICA s/p CEA.   Left Carotid: Velocities in the left ICA are consistent with a 1-39%  stenosis.               Widely patent ICA s/p CEA.   Vertebrals:  Bilateral vertebral arteries demonstrate antegrade flow.  Subclavians: Normal flow hemodynamics were seen in bilateral subclavian               arteries.    Heart monitor 2022 Patch Wear Time:  7 days and 5 hours (2022-03-07T12:20:19-0500 to 2022-03-14T18:39:42-0400)   Patient had a min HR of 56 bpm, max HR of 156 bpm, and avg HR of 82 bpm.  Predominant underlying rhythm was Sinus Rhythm.  1 run of Supraventricular Tachycardia occurred lasting 4 beats with a max rate of 156 bpm (avg 146 bpm).  Rare PACs and PVCs.   Myoview lexiscan 2020 Narrative & Impression  Probably normal myocardial perfusion stress test without significant ischemia or scar on perfusion imaging. The left ventricular ejection fraction is normal (57%). Horizontal ST segment depression ST segment depression of 2 mm was noted during stress in the II, III, aVF, V4, V5 and V6 leads. This is a low risk study based on the perfusion imaging. However, significant ST depressions  were noted during stress. Correlate clinically.     Risk Assessment/Calculations:    Physical Exam:   VS:  There were no vitals taken for this visit.   Wt Readings from Last 3 Encounters:  08/12/23 183 lb 3.2 oz (83.1 kg)  05/06/23 175 lb 4.8 oz (79.5 kg)  03/18/23 179 lb 1.6 oz (81.2 kg)    GEN: Well nourished, well developed in no acute distress NECK: No JVD; No carotid bruits CARDIAC: ***RRR, no murmurs, rubs, gallops RESPIRATORY:  *** CTA b/l without rales, wheezing or rhonchi  ABDOMEN: Soft, non-tender, non-distended EXTREMITIES: *** No edema; No deformity   ASSESSMENT AND PLAN: .    HTN ***  PVD H/o b/l CEA [place] in 2024 looked good ***  Pre-colonoscopy evaluation Low cardiac risk procedure Low cardiac risk score ***       {Are you ordering a CV Procedure (e.g. stress test, cath, DCCV, TEE, etc)?   Press F2        :[id number]}     Dispo: ***  Signed, Sheilah Pigeon, PA-C

## 2023-08-29 NOTE — Telephone Encounter (Signed)
Will update al parties involved pt has appt 08/30/23 with Francis Dowse, [facility]

## 2023-08-29 NOTE — Telephone Encounter (Signed)
Called patient to set up a office- visit for a per-op clearance on 08/30/23 @ 2:20

## 2023-08-30 ENCOUNTER — Ambulatory Visit: Payer: Medicare HMO | Admitting: Physician Assistant

## 2023-08-30 NOTE — Telephone Encounter (Signed)
Blood thinner advice received from Dr. Leonard Schwartz.  She has advised for patient to stop Plavix 5 days prior to procedure and restart 1 day after.  Per Anesthesia Guidelines: Glipizide stop 1 day prior, stop metformin 2 days prior.  Cardiac Clearance Pending: Cardiology appt 10/03/23.  Follow up on cardiac clearance after 10/03/23.  Thanks,  [place], [place]

## 2023-10-03 ENCOUNTER — Ambulatory Visit: Payer: Medicare HMO | Attending: Medical | Admitting: Medical

## 2023-10-03 ENCOUNTER — Encounter: Payer: Self-pay | Admitting: Medical

## 2023-10-03 VITALS — BP 110/68 | HR 88 | Ht 70.0 in | Wt 184.6 lb

## 2023-10-03 DIAGNOSIS — Z01810 Encounter for preprocedural cardiovascular examination: Secondary | ICD-10-CM

## 2023-10-03 DIAGNOSIS — I6523 Occlusion and stenosis of bilateral carotid arteries: Secondary | ICD-10-CM

## 2023-10-03 DIAGNOSIS — Z720 Tobacco use: Secondary | ICD-10-CM

## 2023-10-03 DIAGNOSIS — I10 Essential (primary) hypertension: Secondary | ICD-10-CM | POA: Diagnosis not present

## 2023-10-03 DIAGNOSIS — E782 Mixed hyperlipidemia: Secondary | ICD-10-CM

## 2023-10-03 NOTE — Patient Instructions (Signed)
 Medication Instructions:  Your physician recommends that you continue on your current medications as directed. Please refer to the Current Medication list given to you today.   *If you need a refill on your cardiac medications before your next appointment, please call your pharmacy*   Lab Work: No labs ordered today    Testing/Procedures: Your physician has requested that you have a carotid duplex in Sept, 2025. This test is an ultrasound of the carotid arteries in your neck. It looks at blood flow through these arteries that supply the brain with blood.   Allow one hour for this exam.  There are no restrictions or special instructions.  This will take place at [address] [facility] [address], [place] [address]  Please note: We ask at that you not bring children with you during ultrasound (echo/ vascular) testing. Due to room size and safety concerns, children are not allowed in the ultrasound rooms during exams. Our front office staff cannot provide observation of children in our lobby area while testing is being conducted. An adult accompanying a patient to their appointment will only be allowed in the ultrasound room at the discretion of the ultrasound technician under special circumstances. We apologize for any inconvenience.    Follow-Up: At [facility], you and your health needs are our priority.  As part of our continuing mission to provide you with exceptional heart care, we have created designated Provider Care Teams.  These Care Teams include your primary Cardiologist (physician) and Advanced Practice Providers (APPs -  Physician Assistants and Nurse Practitioners) who all work together to provide you with the care you need, when you need it.  We recommend signing up for the patient portal called "MyChart".  Sign up information is provided on this After Visit Summary.  MyChart is used to connect with patients for Virtual Visits (Telemedicine).  Patients are  able to view lab/test results, encounter notes, upcoming appointments, etc.  Non-urgent messages can be sent to your provider as well.   To learn more about what you can do with MyChart, go to [email or web address].    Your next appointment:   1 year(s)  Provider:   You may see Lorine Bears, MD or one of the following Advanced Practice Providers on your designated Care Team:   Nicolasa Ducking, NP Eula Listen, PA-C Cadence Fransico Michael, PA-C Charlsie Quest, NP Carlos Levering, NP

## 2023-10-03 NOTE — Progress Notes (Signed)
 Cardiology Office Note:  .   Date:  10/03/2023  ID:  [name], DOB July 08, 1956, MRN [id number] PCP: Erasmo Downer, MD  [facility] Providers Cardiologist:  Lorine Bears, MD {   History of Present Illness: .   Shawn Sherman is a 68 y.o. male with a hx of  HTN, HLD, tobacco use, carotid artery disease s/p bilateral carotid endarterectomies presents today for follow-up and pre-op evaluation.   He was seen 08/2018 for chest pain and noted to have left carotid bruit. Treadmill myoview with ST depression with exercise but reports on symptoms and perfusion imaging was normal. He underwent bilateral carotid endarterectomies 01/2019 with Dr. Wyn Quaker of [facility] for severe bilateral carotid stenosis.    He was seen 09/12/20 and was overall feeling well. EKG showed ST with a hear rate of 110bpm, a 1 day heart monitor was placed. This showed predominately NSR with PVCs and PACs, 1 episode of SVT lasting 4 beats.    Today, the patient presents for colonoscopy, it's a routine colonoscopy. He dips tobacco. He denies chest pain, SOB, leg pain, dizziness, lightheadedness, orthopnea, pnd, heart racing or palpitations.   Studies Reviewed: Marland Kitchen   EKG Interpretation Date/Time:  Thursday October 03 2023 13:36:06 EDT Ventricular Rate:  88 PR Interval:  132 QRS Duration:  90 QT Interval:  356 QTC Calculation: 430 R Axis:   30  Text Interpretation: Normal sinus rhythm Normal ECG When compared with ECG of 05-Mar-2023 14:01, No significant change was found Confirmed by Fransico Michael, Kylie Gros ([id number]) on 10/03/2023 1:40:46 PM     VAS carotid US 03/2023 Summary:  Right Carotid: There was no evidence of thrombus, dissection,  atherosclerotic                plaque or stenosis in the cervical carotid system.   Left Carotid: There was no evidence of thrombus, dissection,  atherosclerotic               plaque or stenosis in the cervical carotid system.   Vertebrals:  Bilateral vertebral arteries demonstrate antegrade  flow.  Subclavians: Normal flow hemodynamics were seen in bilateral subclavian               arteries.       Physical Exam:   VS:  BP 110/68   Pulse 88   Ht 5\' 10"  (1.778 m)   Wt 184 lb 9.6 oz (83.7 kg)   SpO2 96%   BMI 26.49 kg/m    Wt Readings from Last 3 Encounters:  10/03/23 184 lb 9.6 oz (83.7 kg)  08/12/23 183 lb 3.2 oz (83.1 kg)  05/06/23 175 lb 4.8 oz (79.5 kg)    GEN: Well nourished, well developed in no acute distress NECK: No JVD; No carotid bruits CARDIAC: RRR, no murmurs, rubs, gallops RESPIRATORY:  Clear to auscultation without rales, wheezing or rhonchi  ABDOMEN: Soft, non-tender, non-distended EXTREMITIES:  No edema; No deformity   ASSESSMENT AND PLAN: .    Pre-op for colonoscopy HTN HLD Carotid artery disease s/p endarterectomies Tobacco use Plan for routine colonoscopy. Patient is overall stable from a cardiac perspective. He denies anginal symptoms. He has h/o b/l carotid endarterectomies. Most recent carotid US 03/2023 was normal with no plaque or stenosis, plan to repeat in 03/2024. He denies lightheadedness or dizziness. He is still using tobacco. EKG today shows NSR with no changes. Continue amlodipine, ASA, Lipitor, Plavix, Zetia, and Losartan. Per MD, OK to hold Plavix 5 days prior to surgery  and continue ASA throughout per-operative period. METS>4. According the revised cardiac risk index he is 3.9% risk of MACE. No further cardiac work-up prior to procedure.   Dispo: Follow-up in 1 year  Signed, Shadrach Bartunek David Stall, PA-C

## 2023-10-07 NOTE — Telephone Encounter (Signed)
 Cardiac clearance granted per chart note dated 10/03/23:  ASSESSMENT AND PLAN: .     Pre-op for colonoscopy HTN HLD Carotid artery disease s/p endarterectomies Tobacco use Plan for routine colonoscopy. Patient is overall stable from a cardiac perspective. He denies anginal symptoms. He has h/o b/l carotid endarterectomies. Most recent carotid US 03/2023 was normal with no plaque or stenosis, plan to repeat in 03/2024. He denies lightheadedness or dizziness. He is still using tobacco. EKG today shows NSR with no changes. Continue amlodipine, ASA, Lipitor, Plavix, Zetia, and Losartan. Per MD, OK to hold Plavix 5 days prior to surgery and continue ASA throughout per-operative period. METS>4. According the revised cardiac risk index he is 3.9% risk of MACE. No further cardiac work-up prior to procedure.    Dispo: Follow-up in 1 year   Signed, Cadence David Stall, PA-C

## 2023-10-07 NOTE — Telephone Encounter (Signed)
 Mychart message not sent due to patient notifications are turned off.  Thanks, [place], [place]

## 2023-10-07 NOTE — Telephone Encounter (Signed)
 Tried to contact patient to schedule colonoscopy however voice mail is full on cell number and home phone not in service.  Will send mychart message to let him know that clearance and blood thinner advice has been received and we can move forward with scheduling his colonoscopy.  Thanks,  [place], [place]

## 2023-10-16 ENCOUNTER — Encounter: Payer: Self-pay | Admitting: *Deleted

## 2023-11-03 ENCOUNTER — Other Ambulatory Visit: Payer: Self-pay | Admitting: Family Medicine

## 2023-11-03 DIAGNOSIS — K209 Esophagitis, unspecified without bleeding: Secondary | ICD-10-CM

## 2023-11-05 NOTE — Telephone Encounter (Signed)
 Requested Prescriptions  Pending Prescriptions Disp Refills   esomeprazole  (NEXIUM ) 40 MG capsule [Pharmacy Med Name: ESOMEPRAZOLE  MAG DR 40 MG CAP] 90 capsule 1    Sig: TAKE 1 CAPSULE (40 MG TOTAL) BY MOUTH DAILY AT 12 NOON.     Gastroenterology: Proton Pump Inhibitors 2 Passed - 11/05/2023 11:04 AM      Passed - ALT in normal range and within 360 days    ALT  Date Value Ref Range Status  08/12/2023 17 0 - 44 IU/L Final         Passed - AST in normal range and within 360 days    AST  Date Value Ref Range Status  08/12/2023 16 0 - 40 IU/L Final         Passed - Valid encounter within last 12 months    Recent Outpatient Visits           2 months ago Encounter for annual physical exam   [place] [facility] [place], Stan Eans, MD       Future Appointments             In 3 months Bacigalupo, Stan Eans, MD [facility]

## 2024-01-14 ENCOUNTER — Other Ambulatory Visit: Payer: Self-pay | Admitting: Medical

## 2024-01-14 ENCOUNTER — Other Ambulatory Visit: Payer: Self-pay | Admitting: Family Medicine

## 2024-01-14 ENCOUNTER — Other Ambulatory Visit (INDEPENDENT_AMBULATORY_CARE_PROVIDER_SITE_OTHER): Payer: Self-pay | Admitting: Nurse Practitioner

## 2024-01-14 ENCOUNTER — Other Ambulatory Visit: Payer: Self-pay | Admitting: Physician Assistant

## 2024-01-14 DIAGNOSIS — E782 Mixed hyperlipidemia: Secondary | ICD-10-CM

## 2024-01-14 DIAGNOSIS — K209 Esophagitis, unspecified without bleeding: Secondary | ICD-10-CM

## 2024-01-30 ENCOUNTER — Other Ambulatory Visit: Payer: Self-pay | Admitting: Family Medicine

## 2024-01-30 DIAGNOSIS — E782 Mixed hyperlipidemia: Secondary | ICD-10-CM

## 2024-02-01 ENCOUNTER — Other Ambulatory Visit: Payer: Self-pay | Admitting: Family Medicine

## 2024-02-01 DIAGNOSIS — E1169 Type 2 diabetes mellitus with other specified complication: Secondary | ICD-10-CM

## 2024-02-03 NOTE — Telephone Encounter (Signed)
 Requested Prescriptions  Pending Prescriptions Disp Refills   glipiZIDE  (GLUCOTROL  XL) 5 MG 24 hr tablet [Pharmacy Med Name: GLIPIZIDE  ER 5 MG TABLET] 90 tablet 1    Sig: TAKE 1 TABLET BY MOUTH EVERY DAY WITH BREAKFAST     Endocrinology:  Diabetes - Sulfonylureas Passed - 02/03/2024  2:40 PM      Passed - HBA1C is between 0 and 7.9 and within 180 days    Hgb A1c MFr Bld  Date Value Ref Range Status  08/12/2023 7.6 (H) 4.8 - 5.6 % Final    Comment:             Prediabetes: 5.7 - 6.4          Diabetes: >6.4          Glycemic control for adults with diabetes: <7.0          Passed - Cr in normal range and within 360 days    Creatinine, Ser  Date Value Ref Range Status  08/12/2023 0.80 0.76 - 1.27 mg/dL Final         Passed - Valid encounter within last 6 months    Recent Outpatient Visits           5 months ago Encounter for annual physical exam   [place] [facility] [place], Jon HERO, MD       Future Appointments             In 1 week Bacigalupo, Jon HERO, MD [facility]

## 2024-02-07 ENCOUNTER — Other Ambulatory Visit: Payer: Self-pay | Admitting: Family Medicine

## 2024-02-07 DIAGNOSIS — E1169 Type 2 diabetes mellitus with other specified complication: Secondary | ICD-10-CM

## 2024-02-10 ENCOUNTER — Ambulatory Visit: Payer: Medicare HMO | Admitting: Family Medicine

## 2024-02-10 ENCOUNTER — Encounter: Payer: Self-pay | Admitting: Family Medicine

## 2024-02-10 VITALS — BP 123/68 | HR 96 | Resp 16 | Ht 70.0 in | Wt 184.1 lb

## 2024-02-10 DIAGNOSIS — E1169 Type 2 diabetes mellitus with other specified complication: Secondary | ICD-10-CM | POA: Diagnosis not present

## 2024-02-10 DIAGNOSIS — I6523 Occlusion and stenosis of bilateral carotid arteries: Secondary | ICD-10-CM | POA: Diagnosis not present

## 2024-02-10 DIAGNOSIS — I152 Hypertension secondary to endocrine disorders: Secondary | ICD-10-CM

## 2024-02-10 DIAGNOSIS — E1159 Type 2 diabetes mellitus with other circulatory complications: Secondary | ICD-10-CM | POA: Diagnosis not present

## 2024-02-10 DIAGNOSIS — E785 Hyperlipidemia, unspecified: Secondary | ICD-10-CM

## 2024-02-10 DIAGNOSIS — R42 Dizziness and giddiness: Secondary | ICD-10-CM | POA: Diagnosis not present

## 2024-02-10 NOTE — Assessment & Plan Note (Signed)
 Previously elevated A1c at 7.6. Currently on glipizide  and metformin . Jardiance  was not affordable due to high cost. Glipizide  can cause hypoglycemia, which may have contributed to dizziness. - Check A1c, kidney, and liver function - Discuss medication affordability with pharmacist

## 2024-02-10 NOTE — Assessment & Plan Note (Signed)
 Currently managed with Lipitor  and Zetia . - Continue current lipid-lowering therapy - Check cholesterol levels

## 2024-02-10 NOTE — Assessment & Plan Note (Signed)
 Bilateral carotid artery stenosis, post-endarterectomy Carotid endarterectomy performed. Last ultrasound by cardiology in 2022 was normal. - Refer to Dr. Tedra for follow-up on carotid artery stenosis

## 2024-02-10 NOTE — Progress Notes (Signed)
 Established patient visit   Patient: Shawn Sherman   DOB: 1955/09/20   68 y.o. Male  MRN: [id number] Visit Date: 02/10/2024  Today's healthcare provider: Jon Eva, MD   Chief Complaint  Patient presents with   Follow-up    6 Month f/u ( DM)   Subjective    HPI HPI     Follow-up    Additional comments: 6 Month f/u ( DM)      Last edited by Marylen Odella CROME, CMA on 02/10/2024  1:17 PM.       Discussed the use of AI scribe software for clinical note transcription with the patient, who gave verbal consent to proceed.  History of Present Illness   Shawn Sherman is a 68 year old male with hypertension, type two diabetes, and hyperlipidemia who presents with dizziness.  Dizziness occurred for about a week and a half, primarily upon standing, and has since resolved. He could not stand without feeling dizzy and had to lay back down. No dizziness in the past week. He recalls a past episode of vertigo with a sensation of the room spinning. Blood pressure is reported as good. He was on a potassium-related medication prescribed by his cardiologist, which he has not taken due to it not being refilled.  He describes a sensation of something 'crawling around' near his heart, associated with his stomach, but denies any pain. He has a history of carotid artery stenosis and underwent endarterectomies. He has not seen his vascular surgeon since 2022.  An episode of feeling hot and vomiting occurred after eating fried potatoes and shrimp, followed by working outside and taking a shower. No recent illness, changes in diet, or hydration issues.  He has more wax in his left ear, causing itching, and has not seen a dentist recently despite needing dental care. He recently got new glasses and reports that his eye doctor indicated everything was good.  Current medications include amlodipine  10 mg daily, baby aspirin  daily, Lipitor  80 mg daily, Lovaza 75 mg daily, Nexium  40 mg daily, Zetia   10 mg daily, glipizide  XL 5 mg daily, losartan  12.5 mg daily, metformin  500 mg BID, and Cialis  as needed. Jardiance  was previously prescribed but was too expensive under Medicare, leading to a switch to glipizide .         Medications: Outpatient Medications Prior to Visit  Medication Sig   amLODipine  (NORVASC ) 10 MG tablet Take 1 tablet (10 mg total) by mouth daily.   aspirin  81 MG chewable tablet Chew 81 mg by mouth daily.   atorvastatin  (LIPITOR ) 80 MG tablet TAKE 1 TABLET BY MOUTH ONCE EVERY EVENING.   clopidogrel  (PLAVIX ) 75 MG tablet Take 1 tablet (75 mg total) by mouth daily with breakfast.   diclofenac Sodium (VOLTAREN) 1 % GEL Apply 1 application topically 4 (four) times daily as needed (pain).   esomeprazole  (NEXIUM ) 40 MG capsule TAKE 1 CAPSULE (40 MG TOTAL) BY MOUTH DAILY AT 12 NOON.   ezetimibe  (ZETIA ) 10 MG tablet TAKE 1 TABLET BY MOUTH ONCE DAILY   glipiZIDE  (GLUCOTROL  XL) 5 MG 24 hr tablet TAKE 1 TABLET BY MOUTH EVERY DAY WITH BREAKFAST   losartan  (COZAAR ) 25 MG tablet Take 0.5 tablets (12.5 mg total) by mouth daily.   Menthol , Topical Analgesic, (ICY HOT EX) Apply 1 application  topically daily as needed (pain).   metFORMIN  (GLUCOPHAGE ) 500 MG tablet TAKE 1 TABLET BY MOUTH TWICE A DAY WITH FOOD   Multiple Vitamins-Minerals (AIRBORNE PO)  Take 1 tablet by mouth daily.   tadalafil  (CIALIS ) 5 MG tablet Take 1-4 tablets (5-20 mg total) by mouth daily as needed for erectile dysfunction.   No facility-administered medications prior to visit.    Review of Systems     Objective    BP 123/68 (BP Location: Right Arm, Patient Position: Sitting, Cuff Size: Normal)   Pulse 96   Resp 16   Ht 5' 10 (1.778 m)   Wt 184 lb 1.6 oz (83.5 kg)   SpO2 98%   BMI 26.42 kg/m    Physical Exam Vitals reviewed.  Constitutional:      General: He is not in acute distress.    Appearance: Normal appearance. He is not diaphoretic.  HENT:     Head: Normocephalic and atraumatic.  Eyes:      General: No scleral icterus.    Conjunctiva/sclera: Conjunctivae normal.  Cardiovascular:     Rate and Rhythm: Normal rate and regular rhythm.     Heart sounds: Normal heart sounds. No murmur heard. Pulmonary:     Effort: Pulmonary effort is normal. No respiratory distress.     Breath sounds: Normal breath sounds. No wheezing or rhonchi.  Musculoskeletal:     Cervical back: Neck supple.     Right lower leg: No edema.     Left lower leg: No edema.  Lymphadenopathy:     Cervical: No cervical adenopathy.  Skin:    General: Skin is warm and dry.     Findings: No rash.  Neurological:     Mental Status: He is alert and oriented to person, place, and time. Mental status is at baseline.  Psychiatric:        Mood and Affect: Mood normal.        Behavior: Behavior normal.      No results found for any visits on 02/10/24.  Assessment & Plan     Problem List Items Addressed This Visit       Cardiovascular and Mediastinum   Hypertension associated with diabetes (HCC) - Primary   Relevant Orders   Comprehensive metabolic panel with GFR   Carotid stenosis   Relevant Orders   Ambulatory referral to Vascular Surgery     Endocrine   Hyperlipidemia associated with type 2 diabetes mellitus (HCC)   Relevant Orders   Comprehensive metabolic panel with GFR   Lipid panel   T2DM (type 2 diabetes mellitus) (HCC)   Relevant Orders   Hemoglobin A1c   Microalbumin / creatinine urine ratio   Other Visit Diagnoses       Dizziness       Relevant Orders   EKG 12-Lead           Dizziness, resolved Recent episode of dizziness upon standing, resolved at present. EKG normal, blood pressure well-controlled at 123/68. Possible vertigo considered as differential diagnosis. Dizziness may have been related to hypoglycemia or vertigo. - Monitor for recurrence of dizziness  Left ear cerumen impaction Increased wax production causing itching. No complete obstruction noted. - Use Debrox ear  drops for cerumen removal       Return in about 6 months (around 08/12/2024) for CPE.       Jon Eva, MD  [facility] [phone] (phone) [phone] (fax)  [facility]

## 2024-02-10 NOTE — Assessment & Plan Note (Signed)
 lood pressure well-controlled at 123/68. Losartan  prescription needs clarification with cardiology. - Continue current antihypertensive medications - Check with cardiology regarding losartan  prescription

## 2024-02-11 ENCOUNTER — Ambulatory Visit: Payer: Self-pay | Admitting: Family Medicine

## 2024-02-11 LAB — COMPREHENSIVE METABOLIC PANEL WITH GFR
ALT: 22 IU/L (ref 0–44)
AST: 25 IU/L (ref 0–40)
Albumin: 4.4 g/dL (ref 3.9–4.9)
Alkaline Phosphatase: 131 IU/L — ABNORMAL HIGH (ref 44–121)
BUN/Creatinine Ratio: 7 — ABNORMAL LOW (ref 10–24)
BUN: 7 mg/dL — ABNORMAL LOW (ref 8–27)
Bilirubin Total: 0.7 mg/dL (ref 0.0–1.2)
CO2: 19 mmol/L — ABNORMAL LOW (ref 20–29)
Calcium: 9.5 mg/dL (ref 8.6–10.2)
Chloride: 101 mmol/L (ref 96–106)
Creatinine, Ser: 0.94 mg/dL (ref 0.76–1.27)
Globulin, Total: 2.1 g/dL (ref 1.5–4.5)
Glucose: 138 mg/dL — ABNORMAL HIGH (ref 70–99)
Potassium: 3.9 mmol/L (ref 3.5–5.2)
Sodium: 139 mmol/L (ref 134–144)
Total Protein: 6.5 g/dL (ref 6.0–8.5)
eGFR: 89 mL/min/1.73 (ref >59)

## 2024-02-11 LAB — MICROALBUMIN / CREATININE URINE RATIO
Creatinine, Urine: 244.6 mg/dL
Microalb/Creat Ratio: 8 mg/g{creat} (ref 0–29)
Microalbumin, Urine: 18.9 ug/mL

## 2024-02-11 LAB — LIPID PANEL
Chol/HDL Ratio: 3.3 ratio (ref 0.0–5.0)
Cholesterol, Total: 124 mg/dL (ref 100–199)
HDL: 38 mg/dL — ABNORMAL LOW (ref >39)
LDL Chol Calc (NIH): 59 mg/dL (ref 0–99)
Triglycerides: 159 mg/dL — ABNORMAL HIGH (ref 0–149)
VLDL Cholesterol Cal: 27 mg/dL (ref 5–40)

## 2024-02-11 LAB — HEMOGLOBIN A1C
Est. average glucose Bld gHb Est-mCnc: 131 mg/dL
Hgb A1c MFr Bld: 6.2 % — ABNORMAL HIGH (ref 4.8–5.6)

## 2024-02-13 ENCOUNTER — Telehealth: Payer: Self-pay | Admitting: Medical

## 2024-02-13 NOTE — Telephone Encounter (Signed)
*  STAT* If patient is at the pharmacy, call can be transferred to refill team.   1. Which medications need to be refilled? (please list name of each medication and dose if known)    losartan  (COZAAR ) 25 MG tablet  ezetimibe  (ZETIA ) 10 MG tablet   NEW PHARMACY   4. Which pharmacy/location (including street and city if local pharmacy) is medication to be sent to?  [id number] [place], [place] - [address] Phone: [phone]  Fax: [phone]       5. Do they need a 30 day or 90 day supply? 90

## 2024-02-14 MED ORDER — LOSARTAN POTASSIUM 25 MG PO TABS: 12.5000 mg | ORAL_TABLET | Freq: Every day | ORAL | 3 refills | Status: AC

## 2024-02-14 MED ORDER — EZETIMIBE 10 MG PO TABS: 10.0000 mg | ORAL_TABLET | Freq: Every day | ORAL | 3 refills | Status: AC

## 2024-02-14 NOTE — Telephone Encounter (Signed)
 Chart reviewed and medication sent to the pharmacy.  Called the patient but no answer and voicemail box full. Unable to leave a voicemail at this time.  Mychart message sent to patient letting him know rx has been sent over the to the pharmacy.

## 2024-02-21 ENCOUNTER — Other Ambulatory Visit: Payer: Self-pay

## 2024-02-21 MED ORDER — CLOPIDOGREL BISULFATE 75 MG PO TABS: 75.0000 mg | ORAL_TABLET | Freq: Every day | ORAL | 3 refills | Status: AC

## 2024-03-05 ENCOUNTER — Other Ambulatory Visit: Payer: Self-pay | Admitting: Family Medicine

## 2024-03-05 DIAGNOSIS — E782 Mixed hyperlipidemia: Secondary | ICD-10-CM

## 2024-03-05 NOTE — Telephone Encounter (Unsigned)
 Copied from CRM #[id number]. Topic: Clinical - Medication Refill >> Mar 05, 2024  1:19 PM Lauren C wrote: Medication: atorvastatin  (LIPITOR ) 80 MG tablet   Has the patient contacted their pharmacy? No (Agent: If no, request that the patient contact the pharmacy for the refill. If patient does not wish to contact the pharmacy document the reason why and proceed with request.) (Agent: If yes, when and what did the pharmacy advise?)  This is the patient's preferred pharmacy:  [facility] [address], [place] - [address] [place] [address] Phone: [phone] Fax: [phone]  Is this the correct pharmacy for this prescription? Yes If no, delete pharmacy and type the correct one.   Has the prescription been filled recently? Yes  Is the patient out of the medication? Yes  Has the patient been seen for an appointment in the last year OR does the patient have an upcoming appointment? Yes  Can we respond through MyChart? No  Agent: Please be advised that Rx refills may take up to 3 business days. We ask that you follow-up with your pharmacy.

## 2024-03-17 NOTE — Telephone Encounter (Signed)
 Patient calling to check on request. Advised denied last sent to [facility] on 01/16/24 90 day supply. Patient states he does not use [facility]. Current bottle he has has refill date of 11/04/2023. Patient did not pick up prescription from [facility] as he does not use that pharmacy.   [facility] [address] [place] [address]

## 2024-03-18 ENCOUNTER — Ambulatory Visit: Payer: Medicare HMO

## 2024-03-18 DIAGNOSIS — Z0000 Encounter for general adult medical examination without abnormal findings: Secondary | ICD-10-CM

## 2024-03-18 DIAGNOSIS — Z1211 Encounter for screening for malignant neoplasm of colon: Secondary | ICD-10-CM

## 2024-03-18 NOTE — Patient Instructions (Addendum)
 Mr. Shawn Sherman,  Thank you for taking the time for your Medicare Wellness Visit. I appreciate your continued commitment to your health goals. Please review the care plan we discussed, and feel free to reach out if I can assist you further.  Medicare recommends these wellness visits once per year to help you and your care team stay ahead of potential health issues. These visits are designed to focus on prevention, allowing your provider to concentrate on managing your acute and chronic conditions during your regular appointments.  Please note that Annual Wellness Visits do not include a physical exam. Some assessments may be limited, especially if the visit was conducted virtually. If needed, we may recommend a separate in-person follow-up with your provider.  Ongoing Care Seeing your primary care provider every 3 to 6 months helps us  monitor your health and provide consistent, personalized care.   Referrals If a referral was made during today's visit and you haven't received any updates within two weeks, please contact the referred provider directly to check on the status.  Recommended Screenings:  Health Maintenance  Topic Date Due   Zoster (Shingles) Vaccine (1 of 2) Never done   Colon Cancer Screening  03/30/2018   COVID-19 Vaccine (3 - Pfizer risk series) 04/02/2020   DTaP/Tdap/Td vaccine (3 - Td or Tdap) 06/24/2021   Flu Shot  10/06/2024*   Hemoglobin A1C  08/12/2024   Yearly kidney function blood test for diabetes  02/09/2025   Yearly kidney health urinalysis for diabetes  02/09/2025   Complete foot exam   02/09/2025   Eye exam for diabetics  02/09/2025   Medicare Annual Wellness Visit  03/18/2025   Pneumococcal Vaccine for age over 85  Completed   Hepatitis C Screening  Completed   HPV Vaccine  Aged Out   Meningitis B Vaccine  Aged Out  *Topic was postponed. The date shown is not the original due date.     Advance Care Planning is important because it: Ensures you receive  medical care that aligns with your values, goals, and preferences. Provides guidance to your family and loved ones, reducing the emotional burden of decision-making during critical moments.  Vision: Annual vision screenings are recommended for early detection of glaucoma, cataracts, and diabetic retinopathy. These exams can also reveal signs of chronic conditions such as diabetes and high blood pressure.  Dental: Annual dental screenings help detect early signs of oral cancer, gum disease, and other conditions linked to overall health, including heart disease and diabetes.  Please see the attached documents for additional preventive care recommendations.   NEXT AWV 03/24/25 @ 12:30 PM BY PHONE

## 2024-03-18 NOTE — Progress Notes (Signed)
 Subjective:   Shawn Sherman is a 68 y.o. who presents for a Medicare Wellness preventive visit.  As a reminder, Annual Wellness Visits don't include a physical exam, and some assessments may be limited, especially if this visit is performed virtually. We may recommend an in-person follow-up visit with your provider if needed.  Visit Complete: Virtual I connected with  Shawn Sherman on 03/18/24 by a audio enabled telemedicine application and verified that I am speaking with the correct person using two identifiers.  Patient Location: Home  Provider Location: Home Office  I discussed the limitations of evaluation and management by telemedicine. The patient expressed understanding and agreed to proceed.  Vital Signs: Because this visit was a virtual/telehealth visit, some criteria may be missing or patient reported. Any vitals not documented were not able to be obtained and vitals that have been documented are patient reported.  VideoDeclined- This patient declined Librarian, academic. Therefore the visit was completed with audio only.  Persons Participating in Visit: Patient.  AWV Questionnaire: No: Patient Medicare AWV questionnaire was not completed prior to this visit.  Cardiac Risk Factors include: advanced age (>29men, >57 women);diabetes mellitus;dyslipidemia;hypertension;male gender;sedentary lifestyle     Objective:    There were no vitals filed for this visit. There is no height or weight on file to calculate BMI.     03/18/2024    1:58 PM 03/18/2023    2:16 PM 03/14/2022   10:36 AM 02/24/2021    8:16 AM 02/16/2021   11:35 AM 01/26/2021   12:37 PM 08/22/2020    1:47 PM  Advanced Directives  Does Patient Have a Medical Advance Directive? No No No No No No No  Would patient like information on creating a medical advance directive? No - Patient declined  No - Patient declined   No - Patient declined No - Patient declined    Current Medications  (verified) Outpatient Encounter Medications as of 03/18/2024  Medication Sig   amLODipine  (NORVASC ) 10 MG tablet Take 1 tablet (10 mg total) by mouth daily.   atorvastatin  (LIPITOR ) 80 MG tablet TAKE 1 TABLET BY MOUTH ONCE EVERY EVENING.   clopidogrel  (PLAVIX ) 75 MG tablet Take 1 tablet (75 mg total) by mouth daily with breakfast.   diclofenac Sodium (VOLTAREN) 1 % GEL Apply 1 application topically 4 (four) times daily as needed (pain).   esomeprazole  (NEXIUM ) 40 MG capsule TAKE 1 CAPSULE (40 MG TOTAL) BY MOUTH DAILY AT 12 NOON.   ezetimibe  (ZETIA ) 10 MG tablet Take 1 tablet (10 mg total) by mouth daily.   glipiZIDE  (GLUCOTROL  XL) 5 MG 24 hr tablet TAKE 1 TABLET BY MOUTH EVERY DAY WITH BREAKFAST   losartan  (COZAAR ) 25 MG tablet Take 0.5 tablets (12.5 mg total) by mouth daily.   Menthol , Topical Analgesic, (ICY HOT EX) Apply 1 application  topically daily as needed (pain).   metFORMIN  (GLUCOPHAGE ) 500 MG tablet TAKE 1 TABLET BY MOUTH TWICE A DAY WITH FOOD   Multiple Vitamins-Minerals (AIRBORNE PO) Take 1 tablet by mouth daily.   tadalafil  (CIALIS ) 5 MG tablet Take 1-4 tablets (5-20 mg total) by mouth daily as needed for erectile dysfunction.   aspirin  81 MG chewable tablet Chew 81 mg by mouth daily. (Patient not taking: Reported on 03/18/2024)   No facility-administered encounter medications on file as of 03/18/2024.    Allergies (verified) Patient has no known allergies.   History: Past Medical History:  Diagnosis Date   AKI (acute kidney injury) (HCC)  01/26/2021   Anginal pain (HCC) 08/2018   a. 08/2018 - atyp c/p and neg trop; b. 08/2018 MV: EF 57%, inferolateral horizontal ST depression w/o ischemia or infarct.   Arthritis    Carotid arterial disease (HCC)    a. 01/2019 Carotid U/S: RICA 80-90, RCCA <50, RECA>50, LICA 80-99, LCCA>50, LECA>50; b. 01/2019 CT Head/Neck: RICA string sign in bulb - lumen <42mm. Sev prox RECA dzs. LICA 60 @ dist bulb. R vert 50-70, L vert 30-50.   Carotid bruit  present 08/2018   left   Chewing tobacco nicotine dependence    Coronary artery disease    Diabetes (HCC)    2024   Essential hypertension 03/29/2017   GERD (gastroesophageal reflux disease)    Headache    migrraines   History of 2019 novel coronavirus disease (COVID-19) 08/04/2020   History of kidney stones    Hyperlipidemia    Sepsis secondary to UTI (HCC) 01/26/2021   Past Surgical History:  Procedure Laterality Date   ANTERIOR LAT LUMBAR FUSION Right 01/01/2013   Procedure: lumbar three-four extreme lateral interbody fusion;  Surgeon: Darina MALVA Boehringer, MD;  Location: [facility];  Service: Neurosurgery;  Laterality: Right;  Lumbar three-four extreme lateral interbody fusion     BACK SURGERY  2008   COLONOSCOPY     CYSTOSCOPY WITH STENT PLACEMENT Left 01/26/2021   Procedure: CYSTOSCOPY WITH STENT PLACEMENT;  Surgeon: Francisca Redell BROCKS, MD;  Location: [facility];  Service: Urology;  Laterality: Left;   CYSTOSCOPY/URETEROSCOPY/HOLMIUM LASER/STENT PLACEMENT Left 02/24/2021   Procedure: CYSTOSCOPY/URETEROSCOPY/HOLMIUM LASER/STENT EXCHANGE;  Surgeon: Francisca Redell BROCKS, MD;  Location: [facility];  Service: Urology;  Laterality: Left;   ENDARTERECTOMY Right 02/18/2019   Procedure: ENDARTERECTOMY CAROTID;  Surgeon: Marea Selinda RAMAN, MD;  Location: [facility];  Service: Vascular;  Laterality: Right;   ENDARTERECTOMY Left 06/11/2019   Procedure: ENDARTERECTOMY CAROTID;  Surgeon: Marea Selinda RAMAN, MD;  Location: [facility];  Service: Vascular;  Laterality: Left;   ESOPHAGOGASTRODUODENOSCOPY (EGD) WITH PROPOFOL  N/A 07/05/2015   Procedure: ESOPHAGOGASTRODUODENOSCOPY (EGD) WITH PROPOFOL ;  Surgeon: Rogelia Copping, MD;  Location: [facility];  Service: Endoscopy;  Laterality: N/A;   EXTRACORPOREAL SHOCK WAVE LITHOTRIPSY Right 09/11/2018   Procedure: EXTRACORPOREAL SHOCK WAVE LITHOTRIPSY (ESWL);  Surgeon: Twylla Glendia BROCKS, MD;  Location: [facility];  Service: Urology;  Laterality: Right;   HIP SURGERY Left 05/2023    LUMBAR PERCUTANEOUS PEDICLE SCREW 1 LEVEL N/A 01/01/2013   Procedure: lumbar three-four percutaneous pedicle screws ;  Surgeon: Darina MALVA Boehringer, MD;  Location: [facility];  Service: Neurosurgery;  Laterality: N/A;  lumbar three-four percutaneous pedicle screws   SHOULDER ARTHROSCOPY WITH ROTATOR CUFF REPAIR Left 08/22/2020   Procedure: SHOULDER ARTHROSCOPY WITH ROTATOR CUFF REPAIR AND DEBRIDEMENT;  Surgeon: Leora Lynwood SAUNDERS, MD;  Location: [facility];  Service: Orthopedics;  Laterality: Left;   Family History  Problem Relation Age of Onset   Cancer Mother    Cancer Father    COPD Sister    Social History   Socioeconomic History   Marital status: Married    Spouse name: Shawnee   Number of children: Not on file   Years of education: Not on file   Highest education level: Not on file  Occupational History   Not on file  Tobacco Use   Smoking status: Never   Smokeless tobacco: Current    Types: Chew  Vaping Use   Vaping status: Never Used  Substance and Sexual Activity   Alcohol use: No  Drug use: No   Sexual activity: Yes    Birth control/protection: None  Other Topics Concern   Not on file  Social History Narrative   Not on file   Social Drivers of Health   Financial Resource Strain: Low Risk  (03/18/2024)   Overall Financial Resource Strain (CARDIA)    Difficulty of Paying Living Expenses: Not hard at all  Food Insecurity: No Food Insecurity (03/18/2024)   Hunger Vital Sign    Worried About Running Out of Food in the Last Year: Never true    Ran Out of Food in the Last Year: Never true  Transportation Needs: No Transportation Needs (03/18/2024)   PRAPARE - Administrator, Civil Service (Medical): No    Lack of Transportation (Non-Medical): No  Physical Activity: Insufficiently Active (03/18/2024)   Exercise Vital Sign    Days of Exercise per Week: 2 days    Minutes of Exercise per Session: 60 min  Stress: No Stress Concern Present (03/18/2024)   [employer] - Occupational Stress Questionnaire    Feeling of Stress: Not at all  Social Connections: Unknown (03/18/2024)   Social Connection and Isolation Panel    Frequency of Communication with Friends and Family: Twice a week    Frequency of Social Gatherings with Friends and Family: Not on file    Attends Religious Services: Never    Database administrator or Organizations: No    Attends Engineer, structural: Never    Marital Status: Married    Tobacco Counseling Ready to quit: Not Answered Counseling given: Not Answered    Clinical Intake:  Pre-visit preparation completed: Yes  Pain : No/denies pain     BMI - recorded: 26.4 Nutritional Status: BMI 25 -29 Overweight Nutritional Risks: None Diabetes: Yes CBG done?: No Did pt. bring in CBG monitor from home?: No  Lab Results  Component Value Date   HGBA1C 6.2 (H) 02/10/2024   HGBA1C 7.6 (H) 08/12/2023   HGBA1C 7.0 (A) 05/06/2023     How often do you need to have someone help you when you read instructions, pamphlets, or other written materials from your doctor or pharmacy?: 1 - Never  Interpreter Needed?: No  Information entered by :: JHONNIE DAS, LPN   Activities of Daily Living     03/18/2024    1:59 PM  In your present state of health, do you have any difficulty performing the following activities:  Hearing? 0  Vision? 0  Difficulty concentrating or making decisions? 0  Walking or climbing stairs? 0  Dressing or bathing? 0  Doing errands, shopping? 0  Preparing Food and eating ? N  Using the Toilet? N  In the past six months, have you accidently leaked urine? N  Do you have problems with loss of bowel control? N  Managing your Medications? N  Managing your Finances? N  Housekeeping or managing your Housekeeping? N    Patient Care Team: Myrla Jon HERO, MD as PCP - General (Family Medicine) Darron Deatrice LABOR, MD as PCP - Cardiology (Cardiology)   I have  updated your Care Teams any recent Medical Services you may have received from other providers in the past year.     Assessment:   This is a routine wellness examination for Daylen.  Hearing/Vision screen Hearing Screening - Comments:: NO AIDS Vision Screening - Comments:: WEARS GLASSES ALL DAY- [facility]   Goals Addressed  This Visit's Progress    DIET - INCREASE WATER INTAKE         Depression Screen     03/18/2024    1:57 PM 02/10/2024    1:51 PM 03/18/2023    2:14 PM 03/14/2022   10:34 AM 03/14/2022   10:33 AM 10/09/2021    2:31 PM 02/09/2021   11:14 AM  PHQ 2/9 Scores  PHQ - 2 Score 0 0 0 0 0 2 0  PHQ- 9 Score 0 1  0 0 7     Fall Risk     03/18/2024    1:59 PM 03/18/2023    2:10 PM 03/14/2022   10:37 AM 10/09/2021    2:32 PM 12/31/2019   11:36 AM  Fall Risk   Falls in the past year? 0 0 0 1 0  Number falls in past yr: 0 0 0 0 0  Injury with Fall? 0 0 0 0 0  Risk for fall due to : No Fall Risks No Fall Risks No Fall Risks History of fall(s) No Fall Risks  Follow up Falls evaluation completed;Falls prevention discussed Falls prevention discussed;Education provided Falls evaluation completed  Falls evaluation completed;Education provided;Falls prevention discussed  Falls evaluation completed      Data saved with a previous flowsheet row definition    MEDICARE RISK AT HOME:  Medicare Risk at Home Any stairs in or around the home?: Yes If so, are there any without handrails?: No Home free of loose throw rugs in walkways, pet beds, electrical cords, etc?: Yes Adequate lighting in your home to reduce risk of falls?: Yes Life alert?: No Use of a cane, walker or w/c?: No Grab bars in the bathroom?: No Shower chair or bench in shower?: No Elevated toilet seat or a handicapped toilet?: No  TIMED UP AND GO:  Was the test performed?  No  Cognitive Function: 6CIT completed        03/18/2024    2:00 PM 03/18/2023    2:22 PM 03/14/2022   10:39 AM  6CIT Screen   What Year? 0 points 0 points 0 points  What month? 0 points 0 points 0 points  What time? 0 points 0 points 0 points  Count back from 20 0 points 0 points 0 points  Months in reverse 0 points 2 points 0 points  Repeat phrase 0 points 0 points 0 points  Total Score 0 points 2 points 0 points    Immunizations Immunization History  Administered Date(s) Administered   Fluad Trivalent(High Dose 65+) 05/06/2023   PFIZER(Purple Top)SARS-COV-2 Vaccination 02/06/2020, 03/05/2020   PNEUMOCOCCAL CONJUGATE-20 10/09/2021   Td 06/25/2011   Tdap 06/25/2011    Screening Tests Health Maintenance  Topic Date Due   Zoster Vaccines- Shingrix (1 of 2) Never done   Colonoscopy  03/30/2018   COVID-19 Vaccine (3 - Pfizer risk series) 04/02/2020   DTaP/Tdap/Td (3 - Td or Tdap) 06/24/2021   Influenza Vaccine  10/06/2024 (Originally 02/07/2024)   HEMOGLOBIN A1C  08/12/2024   Diabetic kidney evaluation - eGFR measurement  02/09/2025   Diabetic kidney evaluation - Urine ACR  02/09/2025   FOOT EXAM  02/09/2025   OPHTHALMOLOGY EXAM  02/09/2025   Medicare Annual Wellness (AWV)  03/18/2025   Pneumococcal Vaccine: 50+ Years  Completed   Hepatitis C Screening  Completed   HPV VACCINES  Aged Out   Meningococcal B Vaccine  Aged Out    Health Maintenance Items Addressed: NEEDS SHINGRIX, COVID, TDAP; REFERRAL SENT FOR  COLONOSCOPY  Additional Screening:  Vision Screening: Recommended annual ophthalmology exams for early detection of glaucoma and other disorders of the eye. Is the patient up to date with their annual eye exam?  Yes  Who is the provider or what is the name of the office in which the patient attends annual eye exams? [facility]  Dental Screening: Recommended annual dental exams for proper oral hygiene  Community Resource Referral / Chronic Care Management: CRR required this visit?  No   CCM required this visit?  No   Plan:    I have personally reviewed and noted the following in  the patient's chart:   Medical and social history Use of alcohol, tobacco or illicit drugs  Current medications and supplements including opioid prescriptions. Patient is not currently taking opioid prescriptions. Functional ability and status Nutritional status Physical activity Advanced directives List of other physicians Hospitalizations, surgeries, and ER visits in previous 12 months Vitals Screenings to include cognitive, depression, and falls Referrals and appointments  In addition, I have reviewed and discussed with patient certain preventive protocols, quality metrics, and best practice recommendations. A written personalized care plan for preventive services as well as general preventive health recommendations were provided to patient.   Jhonnie GORMAN Das, LPN   [id number]   After Visit Summary: (MyChart) Due to this being a telephonic visit, the after visit summary with patients personalized plan was offered to patient via MyChart   Notes: REFERRAL SENT FOR COLONOSCOPY

## 2024-03-20 ENCOUNTER — Ambulatory Visit (INDEPENDENT_AMBULATORY_CARE_PROVIDER_SITE_OTHER): Admitting: Nurse Practitioner

## 2024-03-20 ENCOUNTER — Ambulatory Visit (INDEPENDENT_AMBULATORY_CARE_PROVIDER_SITE_OTHER)

## 2024-03-20 ENCOUNTER — Encounter (INDEPENDENT_AMBULATORY_CARE_PROVIDER_SITE_OTHER): Payer: Self-pay | Admitting: Nurse Practitioner

## 2024-03-20 VITALS — BP 114/68 | HR 80 | Ht 70.0 in | Wt 184.2 lb

## 2024-03-20 DIAGNOSIS — E1169 Type 2 diabetes mellitus with other specified complication: Secondary | ICD-10-CM | POA: Diagnosis not present

## 2024-03-20 DIAGNOSIS — I152 Hypertension secondary to endocrine disorders: Secondary | ICD-10-CM | POA: Diagnosis not present

## 2024-03-20 DIAGNOSIS — E785 Hyperlipidemia, unspecified: Secondary | ICD-10-CM | POA: Diagnosis not present

## 2024-03-20 DIAGNOSIS — I6523 Occlusion and stenosis of bilateral carotid arteries: Secondary | ICD-10-CM

## 2024-03-20 DIAGNOSIS — E1159 Type 2 diabetes mellitus with other circulatory complications: Secondary | ICD-10-CM | POA: Diagnosis not present

## 2024-03-21 ENCOUNTER — Encounter (INDEPENDENT_AMBULATORY_CARE_PROVIDER_SITE_OTHER): Payer: Self-pay | Admitting: Nurse Practitioner

## 2024-03-21 NOTE — Progress Notes (Signed)
 Subjective:    Patient ID: Shawn Sherman, male    DOB: [birth year], 68 y.o.   MRN: [id number] Chief Complaint  Patient presents with   New Patient (Initial Visit)     np. LS 2022. fb/jd. Carotid + Consult. Bilateral carotid artery stenosis. Shawn Sherman, BANKS is a 68 year old male is seen for follow up evaluation of carotid stenosis. The carotid stenosis followed by ultrasound. He had a right carotid endarterectomy in 02/18/2019 and had a left in 06/11/2019  The patient denies amaurosis fugax. There is no recent history of TIA symptoms or focal motor deficits. There is no prior documented CVA.  The patient is taking enteric-coated aspirin  81 mg daily.  There is no history of migraine headaches. There is no history of seizures.  The patient has a history of coronary artery disease, no recent episodes of angina or shortness of breath. The patient denies PAD or claudication symptoms. There is a history of hyperlipidemia which is being treated with a statin.    Carotid Duplex done today shows 1 to 39% stenosis bilaterally.  No change compared to last study in 04/03/2023    Review of Systems  All other systems reviewed and are negative.      Objective:   Physical Exam Vitals reviewed.  HENT:     Head: Normocephalic.  Neck:     Vascular: No carotid bruit.  Cardiovascular:     Rate and Rhythm: Normal rate.     Pulses: Normal pulses.  Pulmonary:     Effort: Pulmonary effort is normal.  Skin:    General: Skin is warm and dry.  Neurological:     Mental Status: He is alert and oriented to person, place, and time.  Psychiatric:        Mood and Affect: Mood normal.        Behavior: Behavior normal.        Thought Content: Thought content normal.        Judgment: Judgment normal.     BP 114/68   Pulse 80   Ht 5' 10 (1.778 m)   Wt 184 lb 3.2 oz (83.6 kg)   BMI 26.43 kg/m   Past Medical History:  Diagnosis Date   AKI (acute kidney injury) (HCC)  01/26/2021   Anginal pain (HCC) 08/2018   a. 08/2018 - atyp c/p and neg trop; b. 08/2018 MV: EF 57%, inferolateral horizontal ST depression w/o ischemia or infarct.   Arthritis    Carotid arterial disease (HCC)    a. 01/2019 Carotid U/S: RICA 80-90, RCCA <50, RECA>50, LICA 80-99, LCCA>50, LECA>50; b. 01/2019 CT Head/Neck: RICA string sign in bulb - lumen <41mm. Sev prox RECA dzs. LICA 60 @ dist bulb. R vert 50-70, L vert 30-50.   Carotid bruit present 08/2018   left   Chewing tobacco nicotine dependence    Coronary artery disease    Diabetes (HCC)    2024   Essential hypertension 03/29/2017   GERD (gastroesophageal reflux disease)    Headache    migrraines   History of 2019 novel coronavirus disease (COVID-19) 08/04/2020   History of kidney stones    Hyperlipidemia    Sepsis secondary to UTI (HCC) 01/26/2021    Social History   Socioeconomic History   Marital status: Married    Spouse name: Shawnee   Number of children: Not on file   Years of education: Not on file   Highest education level: Not on file  Occupational History   Not on file  Tobacco Use   Smoking status: Never   Smokeless tobacco: Current    Types: Chew  Vaping Use   Vaping status: Never Used  Substance and Sexual Activity   Alcohol use: No   Drug use: No   Sexual activity: Yes    Birth control/protection: None  Other Topics Concern   Not on file  Social History Narrative   Not on file   Social Drivers of Health   Financial Resource Strain: Low Risk  (03/18/2024)   Overall Financial Resource Strain (CARDIA)    Difficulty of Paying Living Expenses: Not hard at all  Food Insecurity: No Food Insecurity (03/18/2024)   Hunger Vital Sign    Worried About Running Out of Food in the Last Year: Never true    Ran Out of Food in the Last Year: Never true  Transportation Needs: No Transportation Needs (03/18/2024)   PRAPARE - Administrator, Civil Service (Medical): No    Lack of Transportation  (Non-Medical): No  Physical Activity: Insufficiently Active (03/18/2024)   Exercise Vital Sign    Days of Exercise per Week: 2 days    Minutes of Exercise per Session: 60 min  Stress: No Stress Concern Present (03/18/2024)   [employer] of Occupational Health - Occupational Stress Questionnaire    Feeling of Stress: Not at all  Social Connections: Unknown (03/18/2024)   Social Connection and Isolation Panel    Frequency of Communication with Friends and Family: Twice a week    Frequency of Social Gatherings with Friends and Family: Not on file    Attends Religious Services: Never    Database administrator or Organizations: No    Attends Banker Meetings: Never    Marital Status: Married  Catering manager Violence: Not At Risk (03/18/2024)   Humiliation, Afraid, Rape, and Kick questionnaire    Fear of Current or Ex-Partner: No    Emotionally Abused: No    Physically Abused: No    Sexually Abused: No    Past Surgical History:  Procedure Laterality Date   ANTERIOR LAT LUMBAR FUSION Right 01/01/2013   Procedure: lumbar three-four extreme lateral interbody fusion;  Surgeon: Darina MALVA Boehringer, MD;  Location: [facility];  Service: Neurosurgery;  Laterality: Right;  Lumbar three-four extreme lateral interbody fusion     BACK SURGERY  2008   COLONOSCOPY     CYSTOSCOPY WITH STENT PLACEMENT Left 01/26/2021   Procedure: CYSTOSCOPY WITH STENT PLACEMENT;  Surgeon: Francisca Redell BROCKS, MD;  Location: [facility];  Service: Urology;  Laterality: Left;   CYSTOSCOPY/URETEROSCOPY/HOLMIUM LASER/STENT PLACEMENT Left 02/24/2021   Procedure: CYSTOSCOPY/URETEROSCOPY/HOLMIUM LASER/STENT EXCHANGE;  Surgeon: Francisca Redell BROCKS, MD;  Location: [facility];  Service: Urology;  Laterality: Left;   ENDARTERECTOMY Right 02/18/2019   Procedure: ENDARTERECTOMY CAROTID;  Surgeon: Marea Selinda RAMAN, MD;  Location: [facility];  Service: Vascular;  Laterality: Right;   ENDARTERECTOMY Left 06/11/2019   Procedure:  ENDARTERECTOMY CAROTID;  Surgeon: Marea Selinda RAMAN, MD;  Location: [facility];  Service: Vascular;  Laterality: Left;   ESOPHAGOGASTRODUODENOSCOPY (EGD) WITH PROPOFOL  N/A 07/05/2015   Procedure: ESOPHAGOGASTRODUODENOSCOPY (EGD) WITH PROPOFOL ;  Surgeon: Rogelia Copping, MD;  Location: [facility];  Service: Endoscopy;  Laterality: N/A;   EXTRACORPOREAL SHOCK WAVE LITHOTRIPSY Right 09/11/2018   Procedure: EXTRACORPOREAL SHOCK WAVE LITHOTRIPSY (ESWL);  Surgeon: Twylla Glendia BROCKS, MD;  Location: [facility];  Service: Urology;  Laterality: Right;   HIP SURGERY Left 05/2023  LUMBAR PERCUTANEOUS PEDICLE SCREW 1 LEVEL N/A 01/01/2013   Procedure: lumbar three-four percutaneous pedicle screws ;  Surgeon: Darina MALVA Boehringer, MD;  Location: [facility];  Service: Neurosurgery;  Laterality: N/A;  lumbar three-four percutaneous pedicle screws   SHOULDER ARTHROSCOPY WITH ROTATOR CUFF REPAIR Left 08/22/2020   Procedure: SHOULDER ARTHROSCOPY WITH ROTATOR CUFF REPAIR AND DEBRIDEMENT;  Surgeon: Leora Lynwood SAUNDERS, MD;  Location: [facility];  Service: Orthopedics;  Laterality: Left;    Family History  Problem Relation Age of Onset   Cancer Mother    Cancer Father    COPD Sister     No Known Allergies     Latest Ref Rng & Units 05/22/2023    1:09 PM 02/09/2021    1:05 PM 01/27/2021    4:50 AM  CBC  WBC 4.0 - 10.5 K/uL 8.8  7.9  10.1   Hemoglobin 13.0 - 17.0 g/dL 84.7  86.8  87.2   Hematocrit 39.0 - 52.0 % 46.0  37.6  37.7   Platelets 150 - 400 K/uL 229  313  149       CMP     Component Value Date/Time   NA 139 02/10/2024 1359   K 3.9 02/10/2024 1359   CL 101 02/10/2024 1359   CO2 19 (L) 02/10/2024 1359   GLUCOSE 138 (H) 02/10/2024 1359   GLUCOSE 194 (H) 05/22/2023 1309   BUN 7 (L) 02/10/2024 1359   CREATININE 0.94 02/10/2024 1359   CALCIUM  9.5 02/10/2024 1359   PROT 6.5 02/10/2024 1359   ALBUMIN 4.4 02/10/2024 1359   AST 25 02/10/2024 1359   ALT 22 02/10/2024 1359   ALKPHOS 131 (H) 02/10/2024 1359    BILITOT 0.7 02/10/2024 1359   GFRNONAA >60 05/22/2023 1309   GFRAA 113 04/22/2020 1154     No results found.     Assessment & Plan:   1. Bilateral carotid artery stenosis Recommend:  Given the patient's asymptomatic subcritical stenosis no further invasive testing or surgery at this time.  Duplex ultrasound shows 1-39% stenosis bilaterally.  Lateral endarterectomies are widely patent  Continue antiplatelet therapy as prescribed Continue management of CAD, HTN and Hyperlipidemia Healthy heart diet,  encouraged exercise at least 4 times per week Follow up in 12 months with duplex ultrasound and physical exam     2. Hypertension associated with diabetes (HCC) Continue antihypertensive medications as already ordered, these medications have been reviewed and there are no changes at this time.  3. Hyperlipidemia associated with type 2 diabetes mellitus (HCC) Continue statin as ordered and reviewed, no changes at this time    Current Outpatient Medications on File Prior to Visit  Medication Sig Dispense Refill   amLODipine  (NORVASC ) 10 MG tablet Take 1 tablet (10 mg total) by mouth daily. 90 tablet 1   atorvastatin  (LIPITOR ) 80 MG tablet TAKE 1 TABLET BY MOUTH ONCE EVERY EVENING. 90 tablet 0   clopidogrel  (PLAVIX ) 75 MG tablet Take 1 tablet (75 mg total) by mouth daily with breakfast. 90 tablet 3   diclofenac Sodium (VOLTAREN) 1 % GEL Apply 1 application topically 4 (four) times daily as needed (pain).     esomeprazole  (NEXIUM ) 40 MG capsule TAKE 1 CAPSULE (40 MG TOTAL) BY MOUTH DAILY AT 12 NOON. 90 capsule 1   ezetimibe  (ZETIA ) 10 MG tablet Take 1 tablet (10 mg total) by mouth daily. 90 tablet 3   glipiZIDE  (GLUCOTROL  XL) 5 MG 24 hr tablet TAKE 1 TABLET BY MOUTH EVERY DAY WITH BREAKFAST 90  tablet 0   losartan  (COZAAR ) 25 MG tablet Take 0.5 tablets (12.5 mg total) by mouth daily. 45 tablet 3   Menthol , Topical Analgesic, (ICY HOT EX) Apply 1 application  topically daily as needed  (pain).     metFORMIN  (GLUCOPHAGE ) 500 MG tablet TAKE 1 TABLET BY MOUTH TWICE A DAY WITH FOOD 180 tablet 1   Multiple Vitamins-Minerals (AIRBORNE PO) Take 1 tablet by mouth daily.     tadalafil  (CIALIS ) 5 MG tablet Take 1-4 tablets (5-20 mg total) by mouth daily as needed for erectile dysfunction. 30 tablet 11   aspirin  81 MG chewable tablet Chew 81 mg by mouth daily. (Patient not taking: Reported on 03/20/2024)     No current facility-administered medications on file prior to visit.    There are no Patient Instructions on file for this visit. No follow-ups on file.   Brandin Stetzer E Dorsey Charette, NP

## 2024-03-23 ENCOUNTER — Ambulatory Visit: Payer: Self-pay | Admitting: Medical

## 2024-03-24 ENCOUNTER — Encounter

## 2024-04-01 ENCOUNTER — Other Ambulatory Visit: Payer: Self-pay | Admitting: Family Medicine

## 2024-04-01 DIAGNOSIS — E782 Mixed hyperlipidemia: Secondary | ICD-10-CM

## 2024-04-26 ENCOUNTER — Other Ambulatory Visit: Payer: Self-pay | Admitting: Family Medicine

## 2024-04-26 DIAGNOSIS — E1169 Type 2 diabetes mellitus with other specified complication: Secondary | ICD-10-CM

## 2024-04-26 DIAGNOSIS — I10 Essential (primary) hypertension: Secondary | ICD-10-CM

## 2024-04-27 ENCOUNTER — Other Ambulatory Visit: Payer: Self-pay | Admitting: Family Medicine

## 2024-04-27 DIAGNOSIS — K209 Esophagitis, unspecified without bleeding: Secondary | ICD-10-CM

## 2024-07-23 ENCOUNTER — Other Ambulatory Visit: Payer: Self-pay | Admitting: Family Medicine

## 2024-07-23 DIAGNOSIS — E1169 Type 2 diabetes mellitus with other specified complication: Secondary | ICD-10-CM

## 2024-07-27 ENCOUNTER — Other Ambulatory Visit: Payer: Self-pay | Admitting: Family Medicine

## 2024-07-27 DIAGNOSIS — E1169 Type 2 diabetes mellitus with other specified complication: Secondary | ICD-10-CM

## 2024-08-13 ENCOUNTER — Encounter: Payer: Self-pay | Admitting: Family Medicine

## 2024-08-13 ENCOUNTER — Ambulatory Visit: Admitting: Family Medicine

## 2024-08-13 VITALS — BP 111/75 | HR 93 | Resp 16 | Ht 70.0 in | Wt 175.0 lb

## 2024-08-13 DIAGNOSIS — E1169 Type 2 diabetes mellitus with other specified complication: Secondary | ICD-10-CM

## 2024-08-13 DIAGNOSIS — Z0000 Encounter for general adult medical examination without abnormal findings: Secondary | ICD-10-CM

## 2024-08-13 DIAGNOSIS — Z1211 Encounter for screening for malignant neoplasm of colon: Secondary | ICD-10-CM

## 2024-08-13 DIAGNOSIS — E1159 Type 2 diabetes mellitus with other circulatory complications: Secondary | ICD-10-CM

## 2024-08-13 DIAGNOSIS — I6523 Occlusion and stenosis of bilateral carotid arteries: Secondary | ICD-10-CM

## 2024-08-13 DIAGNOSIS — A084 Viral intestinal infection, unspecified: Secondary | ICD-10-CM

## 2024-08-13 NOTE — Progress Notes (Signed)
 "    Complete physical exam   Patient: Shawn Sherman   DOB: 1955-08-02   69 y.o. Male  MRN: [id number] Visit Date: 08/13/2024  Today's healthcare provider: Jon Eva, MD   Chief Complaint  Patient presents with   Annual Exam    Last Exam Physical: 08/12/2023 Diet: Good  Exercise: Walks some days a week Feeling: has been having some stomach problems with some vomiting x 2 days Sleeping: well No concerns   Subjective    Shawn Sherman is a 69 y.o. male who presents today for a complete physical exam.    Discussed the use of AI scribe software for clinical note transcription with the patient, who gave verbal consent to proceed.  History of Present Illness   Shawn Sherman is a 69 year old male who presents with recent gastrointestinal symptoms.  For the past two days he has had sudden onset persistent stomach pain with vomiting after eating. Today vomiting has resolved but he still has abdominal discomfort and loose stools. He thinks this may be a stomach virus because his wife had similar symptoms a couple of weeks ago.  He takes amlodipine  10 mg daily and losartan  12.5 mg daily for blood pressure, and atorvastatin , Plavix , aspirin , Zetia , glipizide , and metformin .  He received a colon cancer stool screening kit but has not used it because of frequent diarrhea. He prefers colonoscopy for screening.        Last depression screening scores    08/13/2024    1:40 PM 03/18/2024    1:57 PM 02/10/2024    1:51 PM  PHQ 2/9 Scores  PHQ - 2 Score 0 0 0  PHQ- 9 Score 0 0  1      Data saved with a previous flowsheet row definition   Last fall risk screening    08/13/2024    1:40 PM  Fall Risk   Falls in the past year? 0  Number falls in past yr: 0  Injury with Fall? 0  Risk for fall due to : No Fall Risks  Follow up Falls evaluation completed        Medications: Show/hide medication list[1]  Review of Systems    Objective    BP 111/75   Pulse 93   Resp 16    Ht 5' 10 (1.778 m)   Wt 175 lb (79.4 kg)   SpO2 98%   BMI 25.11 kg/m    Physical Exam Vitals reviewed.  Constitutional:      General: He is not in acute distress.    Appearance: Normal appearance. He is well-developed. He is not diaphoretic.  HENT:     Head: Normocephalic and atraumatic.     Right Ear: Tympanic membrane, ear canal and external ear normal.     Left Ear: Tympanic membrane, ear canal and external ear normal.     Nose: Nose normal.     Mouth/Throat:     Mouth: Mucous membranes are moist.     Pharynx: Oropharynx is clear. No oropharyngeal exudate.  Eyes:     General: No scleral icterus.    Conjunctiva/sclera: Conjunctivae normal.     Pupils: Pupils are equal, round, and reactive to light.  Neck:     Thyroid : No thyromegaly.  Cardiovascular:     Rate and Rhythm: Normal rate and regular rhythm.     Heart sounds: Normal heart sounds. No murmur heard. Pulmonary:     Effort: Pulmonary effort is normal. No respiratory distress.  Breath sounds: Normal breath sounds. No wheezing or rales.  Abdominal:     General: There is no distension.     Palpations: Abdomen is soft.     Tenderness: There is no abdominal tenderness.  Musculoskeletal:        General: No deformity.     Cervical back: Neck supple.     Right lower leg: No edema.     Left lower leg: No edema.  Lymphadenopathy:     Cervical: No cervical adenopathy.  Skin:    General: Skin is warm and dry.     Findings: No rash.  Neurological:     Mental Status: He is alert and oriented to person, place, and time. Mental status is at baseline.     Gait: Gait normal.  Psychiatric:        Mood and Affect: Mood normal.        Behavior: Behavior normal.        Thought Content: Thought content normal.      No results found for any visits on 08/13/24.  Assessment & Plan    Routine Health Maintenance and Physical Exam  Exercise Activities and Dietary recommendations  Goals      DIET - INCREASE WATER  INTAKE        Immunization History  Administered Date(s) Administered   Fluad Trivalent(High Dose 65+) 05/06/2023   INFLUENZA, HIGH DOSE SEASONAL PF 06/25/2024   PFIZER(Purple Top)SARS-COV-2 Vaccination 02/06/2020, 03/05/2020   PNEUMOCOCCAL CONJUGATE-20 10/09/2021   Td 06/25/2011   Tdap 06/25/2011   Zoster Recombinant(Shingrix) 06/25/2024    Health Maintenance  Topic Date Due   Colonoscopy  03/30/2018   COVID-19 Vaccine (3 - Pfizer risk series) 04/02/2020   DTaP/Tdap/Td (3 - Td or Tdap) 06/24/2021   OPHTHALMOLOGY EXAM  07/06/2024   Diabetic kidney evaluation - Urine ACR  08/12/2024   HEMOGLOBIN A1C  08/12/2024   Zoster Vaccines- Shingrix (2 of 2) 08/20/2024   Diabetic kidney evaluation - eGFR measurement  02/09/2025   FOOT EXAM  02/09/2025   Medicare Annual Wellness (AWV)  03/18/2025   Pneumococcal Vaccine: 50+ Years  Completed   Influenza Vaccine  Completed   Hepatitis C Screening  Completed   Meningococcal B Vaccine  Aged Out    Discussed health benefits of physical activity, and encouraged him to engage in regular exercise appropriate for his age and condition.  Problem List Items Addressed This Visit       Cardiovascular and Mediastinum   Hypertension associated with diabetes (HCC)   Well controlled Continue current medications Recheck metabolic panel       Relevant Orders   Comprehensive metabolic panel with GFR   Carotid stenosis     Endocrine   Hyperlipidemia associated with type 2 diabetes mellitus (HCC)   Currently managed with Lipitor  and Zetia . - Continue current lipid-lowering therapy - Check cholesterol levels      Relevant Orders   Lipid panel   Comprehensive metabolic panel with GFR   T2DM (type 2 diabetes mellitus) (HCC)   Type 2 diabetes mellitus complicated by HTN and HLD - managed with glipizide  and metformin . Blood pressure well-controlled with amlodipine  and losartan . Cholesterol managed with atorvastatin  and Zetia . Regular monitoring  of A1c and kidney function is necessary. - Ordered A1c test - Ordered urine test for diabetes monitoring - Continue current medications: glipizide , metformin , amlodipine , losartan , atorvastatin , Zetia       Relevant Orders   Hemoglobin A1c   Urine Microalbumin w/creat. ratio   Other Visit  Diagnoses       Encounter for annual physical exam    -  Primary     Colon cancer screening       Relevant Orders   Ambulatory referral to Gastroenterology     Viral gastroenteritis               Adult Wellness Visit Routine adult wellness visit with up-to-date vaccinations except for tetanus. Colon cancer screening options discussed, with preference for colonoscopy over Cologuard due to accuracy. - Will administer second shingles vaccine after February 18th, 2026 - Will administer tetanus vaccine after completing shingles vaccination - Will schedule colonoscopy for colon cancer screening  Viral gastroenteritis Acute viral gastroenteritis with nausea, vomiting, and diarrhea for two days. Symptoms improved today with no vomiting. Likely viral etiology, possibly contracted from wife who had similar symptoms. - Advised hydration and gradual reintroduction of bland foods - Recommended Pepto Bismol for nausea if needed  Carotid artery stenosis Managed with Plavix  and aspirin . - Continue current medications: Plavix  and aspirin         Return in about 6 months (around 02/10/2025) for chronic disease f/u.     Jon Eva, MD  [facility] [phone] (phone) [phone] (fax)  [facility]     [1]  Outpatient Medications Prior to Visit  Medication Sig   amLODipine  (NORVASC ) 10 MG tablet TAKE 1 TABLET BY MOUTH EVERY DAY   aspirin  81 MG chewable tablet Chew 81 mg by mouth daily.   atorvastatin  (LIPITOR ) 80 MG tablet TAKE 1 TABLET BY MOUTH ONCE EVERY EVENING. *MAKE APPOINTMENT FOR FURTHER FILLS*   clopidogrel  (PLAVIX ) 75 MG tablet Take 1  tablet (75 mg total) by mouth daily with breakfast.   diclofenac Sodium (VOLTAREN) 1 % GEL Apply 1 application topically 4 (four) times daily as needed (pain).   esomeprazole  (NEXIUM ) 40 MG capsule TAKE 1 CAPSULE (40 MG TOTAL) BY MOUTH DAILY AT 12 NOON.   ezetimibe  (ZETIA ) 10 MG tablet Take 1 tablet (10 mg total) by mouth daily.   glipiZIDE  (GLUCOTROL  XL) 5 MG 24 hr tablet TAKE 1 TABLET BY MOUTH EVERY DAY WITH BREAKFAST   losartan  (COZAAR ) 25 MG tablet Take 0.5 tablets (12.5 mg total) by mouth daily.   metFORMIN  (GLUCOPHAGE ) 500 MG tablet TAKE 1 TABLET BY MOUTH TWICE A DAY WITH FOOD   Multiple Vitamins-Minerals (AIRBORNE PO) Take 1 tablet by mouth daily.   [DISCONTINUED] Menthol , Topical Analgesic, (ICY HOT EX) Apply 1 application  topically daily as needed (pain).   [DISCONTINUED] tadalafil  (CIALIS ) 5 MG tablet Take 1-4 tablets (5-20 mg total) by mouth daily as needed for erectile dysfunction.   No facility-administered medications prior to visit.   "

## 2024-08-13 NOTE — Assessment & Plan Note (Signed)
 Type 2 diabetes mellitus complicated by HTN and HLD - managed with glipizide  and metformin . Blood pressure well-controlled with amlodipine  and losartan . Cholesterol managed with atorvastatin  and Zetia . Regular monitoring of A1c and kidney function is necessary. - Ordered A1c test - Ordered urine test for diabetes monitoring - Continue current medications: glipizide , metformin , amlodipine , losartan , atorvastatin , Zetia 

## 2024-08-13 NOTE — Assessment & Plan Note (Signed)
 Currently managed with Lipitor  and Zetia . - Continue current lipid-lowering therapy - Check cholesterol levels

## 2024-08-13 NOTE — Patient Instructions (Signed)
Consider asking at the pharmacy about the tetanus shot (TDAP) ?

## 2024-08-13 NOTE — Assessment & Plan Note (Signed)
 Well controlled Continue current medications Recheck metabolic panel

## 2024-08-14 LAB — LIPID PANEL
Chol/HDL Ratio: 3.1 ratio (ref 0.0–5.0)
Cholesterol, Total: 125 mg/dL (ref 100–199)
HDL: 40 mg/dL
LDL Chol Calc (NIH): 62 mg/dL (ref 0–99)
Triglycerides: 127 mg/dL (ref 0–149)
VLDL Cholesterol Cal: 23 mg/dL (ref 5–40)

## 2024-08-14 LAB — COMPREHENSIVE METABOLIC PANEL WITH GFR
ALT: 23 [IU]/L (ref 0–44)
AST: 18 [IU]/L (ref 0–40)
Albumin: 4.3 g/dL (ref 3.9–4.9)
Alkaline Phosphatase: 113 [IU]/L (ref 47–123)
BUN/Creatinine Ratio: 11 (ref 10–24)
BUN: 10 mg/dL (ref 8–27)
Bilirubin Total: 0.9 mg/dL (ref 0.0–1.2)
CO2: 23 mmol/L (ref 20–29)
Calcium: 9.6 mg/dL (ref 8.6–10.2)
Chloride: 100 mmol/L (ref 96–106)
Creatinine, Ser: 0.87 mg/dL (ref 0.76–1.27)
Globulin, Total: 2 g/dL (ref 1.5–4.5)
Glucose: 107 mg/dL — ABNORMAL HIGH (ref 70–99)
Potassium: 4.3 mmol/L (ref 3.5–5.2)
Sodium: 139 mmol/L (ref 134–144)
Total Protein: 6.3 g/dL (ref 6.0–8.5)
eGFR: 94 mL/min/{1.73_m2}

## 2024-08-14 LAB — HEMOGLOBIN A1C
Est. average glucose Bld gHb Est-mCnc: 120 mg/dL
Hgb A1c MFr Bld: 5.8 % — ABNORMAL HIGH (ref 4.8–5.6)

## 2024-08-14 LAB — MICROALBUMIN / CREATININE URINE RATIO
Creatinine, Urine: 280 mg/dL
Microalb/Creat Ratio: 8 mg/g{creat} (ref 0–29)
Microalbumin, Urine: 21.4 ug/mL

## 2024-10-06 ENCOUNTER — Ambulatory Visit: Admitting: Medical

## 2025-02-11 ENCOUNTER — Ambulatory Visit: Admitting: Family Medicine

## 2025-03-19 ENCOUNTER — Ambulatory Visit (INDEPENDENT_AMBULATORY_CARE_PROVIDER_SITE_OTHER): Admitting: Nurse Practitioner

## 2025-03-19 ENCOUNTER — Encounter (INDEPENDENT_AMBULATORY_CARE_PROVIDER_SITE_OTHER)

## 2025-03-24 ENCOUNTER — Ambulatory Visit

## 2025-03-30 ENCOUNTER — Ambulatory Visit
